# Patient Record
Sex: Female | Born: 1990 | Race: White | Hispanic: No | Marital: Single | Smoking: Never smoker
Health system: Southern US, Community
[De-identification: ages and names within clinical notes are randomized; demographics above are authoritative.]

## PROBLEM LIST (undated history)

## (undated) ENCOUNTER — Inpatient Hospital Stay (HOSPITAL_COMMUNITY): Payer: Self-pay

## (undated) DIAGNOSIS — Z5189 Encounter for other specified aftercare: Secondary | ICD-10-CM

## (undated) DIAGNOSIS — T7840XA Allergy, unspecified, initial encounter: Secondary | ICD-10-CM

## (undated) DIAGNOSIS — J45909 Unspecified asthma, uncomplicated: Secondary | ICD-10-CM

## (undated) DIAGNOSIS — F1910 Other psychoactive substance abuse, uncomplicated: Secondary | ICD-10-CM

## (undated) DIAGNOSIS — F99 Mental disorder, not otherwise specified: Secondary | ICD-10-CM

## (undated) DIAGNOSIS — D649 Anemia, unspecified: Secondary | ICD-10-CM

## (undated) DIAGNOSIS — F419 Anxiety disorder, unspecified: Secondary | ICD-10-CM

## (undated) DIAGNOSIS — B009 Herpesviral infection, unspecified: Secondary | ICD-10-CM

## (undated) DIAGNOSIS — O139 Gestational [pregnancy-induced] hypertension without significant proteinuria, unspecified trimester: Secondary | ICD-10-CM

## (undated) DIAGNOSIS — F32A Depression, unspecified: Secondary | ICD-10-CM

## (undated) HISTORY — DX: Gestational (pregnancy-induced) hypertension without significant proteinuria, unspecified trimester: O13.9

## (undated) HISTORY — PX: CHOLECYSTECTOMY: SHX55

## (undated) HISTORY — PX: WISDOM TOOTH EXTRACTION: SHX21

## (undated) HISTORY — DX: Anxiety disorder, unspecified: F41.9

## (undated) HISTORY — DX: Unspecified asthma, uncomplicated: J45.909

## (undated) HISTORY — DX: Allergy, unspecified, initial encounter: T78.40XA

## (undated) HISTORY — DX: Encounter for other specified aftercare: Z51.89

## (undated) HISTORY — DX: Anemia, unspecified: D64.9

## (undated) HISTORY — DX: Other psychoactive substance abuse, uncomplicated: F19.10

---
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
## 2012-04-19 HISTORY — PX: GALLBLADDER SURGERY: SHX652

## 2013-05-15 DIAGNOSIS — F3289 Other specified depressive episodes: Secondary | ICD-10-CM | POA: Insufficient documentation

## 2013-10-27 LAB — OB RESULTS CONSOLE HGB/HCT, BLOOD
HEMATOCRIT: 35 %
HEMOGLOBIN: 11.9 g/dL

## 2013-10-27 LAB — OB RESULTS CONSOLE RPR: RPR: NONREACTIVE

## 2013-10-27 LAB — OB RESULTS CONSOLE GC/CHLAMYDIA
CHLAMYDIA, DNA PROBE: NEGATIVE
Gonorrhea: NEGATIVE

## 2013-10-27 LAB — OB RESULTS CONSOLE RUBELLA ANTIBODY, IGM: Rubella: IMMUNE

## 2013-10-27 LAB — OB RESULTS CONSOLE ABO/RH: RH Type: POSITIVE

## 2013-10-27 LAB — OB RESULTS CONSOLE HEPATITIS B SURFACE ANTIGEN: Hepatitis B Surface Ag: NEGATIVE

## 2013-10-27 LAB — OB RESULTS CONSOLE VARICELLA ZOSTER ANTIBODY, IGG: Varicella: IMMUNE

## 2013-10-27 LAB — OB RESULTS CONSOLE PLATELET COUNT: PLATELETS: 409 10*3/uL

## 2013-10-27 LAB — OB RESULTS CONSOLE ANTIBODY SCREEN: ANTIBODY SCREEN: NEGATIVE

## 2013-10-27 LAB — OB RESULTS CONSOLE HIV ANTIBODY (ROUTINE TESTING): HIV: NONREACTIVE

## 2014-03-07 LAB — OB RESULTS CONSOLE HGB/HCT, BLOOD
HCT: 11 %
Hemoglobin: 32.5 g/dL

## 2014-03-07 LAB — OB RESULTS CONSOLE PLATELET COUNT: PLATELETS: 340 10*3/uL

## 2014-03-07 LAB — GLUCOSE TOLERANCE, 1 HOUR (50G) W/O FASTING: GLUCOSE 1 HOUR GTT: 123

## 2014-03-07 LAB — OB RESULTS CONSOLE RPR: RPR: NONREACTIVE

## 2014-03-07 LAB — OB RESULTS CONSOLE HIV ANTIBODY (ROUTINE TESTING): HIV: NONREACTIVE

## 2014-04-30 DIAGNOSIS — O34219 Maternal care for unspecified type scar from previous cesarean delivery: Secondary | ICD-10-CM | POA: Insufficient documentation

## 2014-04-30 DIAGNOSIS — O99323 Drug use complicating pregnancy, third trimester: Secondary | ICD-10-CM | POA: Insufficient documentation

## 2014-04-30 DIAGNOSIS — B009 Herpesviral infection, unspecified: Secondary | ICD-10-CM | POA: Insufficient documentation

## 2014-04-30 DIAGNOSIS — Z653 Problems related to other legal circumstances: Secondary | ICD-10-CM | POA: Insufficient documentation

## 2014-04-30 HISTORY — DX: Drug use complicating pregnancy, third trimester: O99.323

## 2014-04-30 HISTORY — DX: Problems related to other legal circumstances: Z65.3

## 2014-05-03 ENCOUNTER — Encounter: Payer: Self-pay | Admitting: *Deleted

## 2014-05-06 ENCOUNTER — Ambulatory Visit (INDEPENDENT_AMBULATORY_CARE_PROVIDER_SITE_OTHER): Payer: Medicaid Other | Admitting: Obstetrics and Gynecology

## 2014-05-06 ENCOUNTER — Encounter: Payer: Self-pay | Admitting: Obstetrics and Gynecology

## 2014-05-06 VITALS — BP 129/93 | HR 107 | Ht 66.0 in | Wt 245.0 lb

## 2014-05-06 DIAGNOSIS — Z3490 Encounter for supervision of normal pregnancy, unspecified, unspecified trimester: Secondary | ICD-10-CM | POA: Insufficient documentation

## 2014-05-06 DIAGNOSIS — O3421 Maternal care for scar from previous cesarean delivery: Secondary | ICD-10-CM

## 2014-05-06 DIAGNOSIS — O34219 Maternal care for unspecified type scar from previous cesarean delivery: Secondary | ICD-10-CM

## 2014-05-06 DIAGNOSIS — O99323 Drug use complicating pregnancy, third trimester: Secondary | ICD-10-CM

## 2014-05-06 DIAGNOSIS — B009 Herpesviral infection, unspecified: Secondary | ICD-10-CM

## 2014-05-06 DIAGNOSIS — Z331 Pregnant state, incidental: Secondary | ICD-10-CM

## 2014-05-06 DIAGNOSIS — Z98891 History of uterine scar from previous surgery: Secondary | ICD-10-CM | POA: Insufficient documentation

## 2014-05-06 DIAGNOSIS — O98513 Other viral diseases complicating pregnancy, third trimester: Secondary | ICD-10-CM

## 2014-05-06 LAB — POCT URINALYSIS DIP (DEVICE)
Bilirubin Urine: NEGATIVE
GLUCOSE, UA: NEGATIVE mg/dL
Hgb urine dipstick: NEGATIVE
KETONES UR: NEGATIVE mg/dL
Leukocytes, UA: NEGATIVE
Nitrite: NEGATIVE
PROTEIN: NEGATIVE mg/dL
Specific Gravity, Urine: 1.02 (ref 1.005–1.030)
Urobilinogen, UA: 0.2 mg/dL (ref 0.0–1.0)
pH: 6 (ref 5.0–8.0)

## 2014-05-06 MED ORDER — PRENATAL PLUS 27-1 MG PO TABS: 1.0000 | ORAL_TABLET | Freq: Every day | ORAL | Status: DC

## 2014-05-06 MED ORDER — VALACYCLOVIR HCL 500 MG PO TABS: 1000.0000 mg | ORAL_TABLET | Freq: Every day | ORAL | Status: DC

## 2014-05-06 NOTE — Progress Notes (Signed)
Transfer from [facility]-- up to date on lab work.  C/o of pelvic pain and lower back pain.  GBS and cultures today.   VBAC consent form signed. To be scanned into chart.

## 2014-05-06 NOTE — Progress Notes (Signed)
+   FM, states she did have some bleeding last week, not sure if it's vaginal, resolved on its own. Just one spot, not "enough to come to hospital." No LOF.  Occasional Braxton hicks contractions Occasional white discharge, no itch, no smell, no burn.   A/P: #) HSV - rx'd valtrex. No current lesions #) H/O TOLAC - discussed VBAC. Desires. VBAC calculator ~ 46%. Desires TOLAC, consented today. If no spontaneous labor by ~ 41 weeks, prefers repeat LTCS rather than induction. #) H/O drug use - denies for last 2 mos, UDS today #) PNL - wet prep. Gc/ct, GBS today. All other labs UTD #) FWB - s= d, + FHTs, reassuring.   RTC 1 w

## 2014-05-07 LAB — DRUG SCREEN, URINE
Amphetamine Screen, Ur: NEGATIVE
BARBITURATE QUANT UR: NEGATIVE
BENZODIAZEPINES.: NEGATIVE
Cocaine Metabolites: NEGATIVE
Creatinine,U: 65.73 mg/dL
Marijuana Metabolite: NEGATIVE
Methadone: NEGATIVE
OPIATES: NEGATIVE
PROPOXYPHENE: NEGATIVE
Phencyclidine (PCP): NEGATIVE

## 2014-05-07 LAB — GC/CHLAMYDIA PROBE AMP
CT Probe RNA: NEGATIVE
GC Probe RNA: NEGATIVE

## 2014-05-08 LAB — CULTURE, BETA STREP (GROUP B ONLY)

## 2014-05-13 ENCOUNTER — Encounter: Payer: Self-pay | Admitting: *Deleted

## 2014-05-16 ENCOUNTER — Ambulatory Visit (HOSPITAL_COMMUNITY)
Admission: RE | Admit: 2014-05-16 | Discharge: 2014-05-16 | Disposition: A | Discharge status: Home / Self Care | Payer: Medicaid Other | Source: Ambulatory Visit | Attending: Obstetrics & Gynecology | Admitting: Obstetrics & Gynecology

## 2014-05-16 ENCOUNTER — Ambulatory Visit (INDEPENDENT_AMBULATORY_CARE_PROVIDER_SITE_OTHER): Payer: Medicaid Other | Admitting: Family

## 2014-05-16 VITALS — BP 129/90 | HR 111 | Temp 97.7°F | Wt 250.1 lb

## 2014-05-16 DIAGNOSIS — O133 Gestational [pregnancy-induced] hypertension without significant proteinuria, third trimester: Secondary | ICD-10-CM | POA: Diagnosis present

## 2014-05-16 DIAGNOSIS — Z3493 Encounter for supervision of normal pregnancy, unspecified, third trimester: Secondary | ICD-10-CM

## 2014-05-16 DIAGNOSIS — O3421 Maternal care for scar from previous cesarean delivery: Secondary | ICD-10-CM

## 2014-05-16 DIAGNOSIS — Z3A37 37 weeks gestation of pregnancy: Secondary | ICD-10-CM | POA: Insufficient documentation

## 2014-05-16 DIAGNOSIS — Z3A38 38 weeks gestation of pregnancy: Secondary | ICD-10-CM | POA: Insufficient documentation

## 2014-05-16 DIAGNOSIS — O139 Gestational [pregnancy-induced] hypertension without significant proteinuria, unspecified trimester: Secondary | ICD-10-CM | POA: Insufficient documentation

## 2014-05-16 DIAGNOSIS — O98513 Other viral diseases complicating pregnancy, third trimester: Secondary | ICD-10-CM

## 2014-05-16 DIAGNOSIS — B009 Herpesviral infection, unspecified: Secondary | ICD-10-CM

## 2014-05-16 DIAGNOSIS — Z3490 Encounter for supervision of normal pregnancy, unspecified, unspecified trimester: Secondary | ICD-10-CM

## 2014-05-16 LAB — COMPREHENSIVE METABOLIC PANEL
ALK PHOS: 112 U/L (ref 39–117)
ALT: 10 U/L (ref 0–35)
AST: 16 U/L (ref 0–37)
Albumin: 3.3 g/dL — ABNORMAL LOW (ref 3.5–5.2)
BUN: 7 mg/dL (ref 6–23)
CO2: 23 mEq/L (ref 19–32)
Calcium: 9.3 mg/dL (ref 8.4–10.5)
Chloride: 104 mEq/L (ref 96–112)
Creat: 0.52 mg/dL (ref 0.50–1.10)
GLUCOSE: 84 mg/dL (ref 70–99)
POTASSIUM: 4.6 meq/L (ref 3.5–5.3)
Sodium: 134 mEq/L — ABNORMAL LOW (ref 135–145)
Total Bilirubin: 0.3 mg/dL (ref 0.2–1.2)
Total Protein: 6.2 g/dL (ref 6.0–8.3)

## 2014-05-16 LAB — CBC
HEMATOCRIT: 36.5 % (ref 36.0–46.0)
Hemoglobin: 12.2 g/dL (ref 12.0–15.0)
MCH: 26.8 pg (ref 26.0–34.0)
MCHC: 33.4 g/dL (ref 30.0–36.0)
MCV: 80 fL (ref 78.0–100.0)
MPV: 9.4 fL (ref 8.6–12.4)
PLATELETS: 301 10*3/uL (ref 150–400)
RBC: 4.56 MIL/uL (ref 3.87–5.11)
RDW: 15.6 % — AB (ref 11.5–15.5)
WBC: 12.7 10*3/uL — ABNORMAL HIGH (ref 4.0–10.5)

## 2014-05-16 LAB — POCT URINALYSIS DIP (DEVICE)
Bilirubin Urine: NEGATIVE
Glucose, UA: NEGATIVE mg/dL
Ketones, ur: NEGATIVE mg/dL
Nitrite: NEGATIVE
Protein, ur: NEGATIVE mg/dL
SPECIFIC GRAVITY, URINE: 1.025 (ref 1.005–1.030)
Urobilinogen, UA: 0.2 mg/dL (ref 0.0–1.0)
pH: 5.5 (ref 5.0–8.0)

## 2014-05-16 NOTE — Progress Notes (Signed)
Denies headache, vision changes, or epigastric pain.  Consulted with Dr. Erin FullingHarraway-Smith > obtain labs, NST today - reactive in MFM.  Begin 2x/week testing.

## 2014-05-16 NOTE — Progress Notes (Signed)
Pt complains of pain in RLQ and LLQ "sharp pains"  Pt states that Fetal Movement has decreased

## 2014-05-17 LAB — PROTEIN / CREATININE RATIO, URINE
CREATININE, URINE: 151.5 mg/dL
PROTEIN CREATININE RATIO: 0.1 (ref <0.15)
TOTAL PROTEIN, URINE: 15 mg/dL (ref 5–24)

## 2014-05-20 ENCOUNTER — Encounter (HOSPITAL_COMMUNITY): Payer: Self-pay | Admitting: *Deleted

## 2014-05-20 ENCOUNTER — Encounter: Payer: Self-pay | Admitting: Obstetrics & Gynecology

## 2014-05-20 ENCOUNTER — Ambulatory Visit (INDEPENDENT_AMBULATORY_CARE_PROVIDER_SITE_OTHER): Payer: Medicaid Other | Admitting: Obstetrics & Gynecology

## 2014-05-20 VITALS — BP 115/69 | HR 102 | Wt 253.3 lb

## 2014-05-20 DIAGNOSIS — O133 Gestational [pregnancy-induced] hypertension without significant proteinuria, third trimester: Secondary | ICD-10-CM

## 2014-05-20 DIAGNOSIS — O3421 Maternal care for scar from previous cesarean delivery: Secondary | ICD-10-CM

## 2014-05-20 DIAGNOSIS — O98513 Other viral diseases complicating pregnancy, third trimester: Secondary | ICD-10-CM

## 2014-05-20 DIAGNOSIS — B009 Herpesviral infection, unspecified: Secondary | ICD-10-CM

## 2014-05-20 LAB — POCT URINALYSIS DIP (DEVICE)
Bilirubin Urine: NEGATIVE
Glucose, UA: NEGATIVE mg/dL
Hgb urine dipstick: NEGATIVE
KETONES UR: NEGATIVE mg/dL
Nitrite: NEGATIVE
Protein, ur: NEGATIVE mg/dL
SPECIFIC GRAVITY, URINE: 1.025 (ref 1.005–1.030)
UROBILINOGEN UA: 0.2 mg/dL (ref 0.0–1.0)
pH: 5.5 (ref 5.0–8.0)

## 2014-05-20 NOTE — Progress Notes (Signed)
Pt worried she will not be successful for VBAC.  Pt's cervix is closed and long with tone.  Pt would like rpt c/s at 39 weeks.  Scheduled out of clinic today by [place] and pt knows time to arrive and NPO status.  BP stable today.  Testing reassruing.

## 2014-05-21 ENCOUNTER — Inpatient Hospital Stay (HOSPITAL_COMMUNITY): Payer: Medicaid Other | Admitting: Anesthesiology

## 2014-05-21 ENCOUNTER — Encounter (HOSPITAL_COMMUNITY): Payer: Self-pay | Admitting: Anesthesiology

## 2014-05-21 ENCOUNTER — Encounter (HOSPITAL_COMMUNITY)
Admission: RE | Disposition: A | Discharge status: Home / Self Care | Payer: Self-pay | Source: Ambulatory Visit | Attending: Obstetrics & Gynecology

## 2014-05-21 ENCOUNTER — Inpatient Hospital Stay (HOSPITAL_COMMUNITY)
Admission: RE | Admit: 2014-05-21 | Discharge: 2014-05-24 | DRG: 765 | Disposition: A | Discharge status: Home / Self Care | Payer: Medicaid Other | Source: Ambulatory Visit | Attending: Obstetrics & Gynecology | Admitting: Obstetrics & Gynecology

## 2014-05-21 DIAGNOSIS — O3421 Maternal care for scar from previous cesarean delivery: Secondary | ICD-10-CM | POA: Diagnosis present

## 2014-05-21 DIAGNOSIS — Z3A39 39 weeks gestation of pregnancy: Secondary | ICD-10-CM | POA: Diagnosis present

## 2014-05-21 DIAGNOSIS — O98513 Other viral diseases complicating pregnancy, third trimester: Secondary | ICD-10-CM | POA: Diagnosis not present

## 2014-05-21 DIAGNOSIS — Z98891 History of uterine scar from previous surgery: Secondary | ICD-10-CM

## 2014-05-21 DIAGNOSIS — B009 Herpesviral infection, unspecified: Secondary | ICD-10-CM | POA: Diagnosis not present

## 2014-05-21 DIAGNOSIS — O133 Gestational [pregnancy-induced] hypertension without significant proteinuria, third trimester: Secondary | ICD-10-CM

## 2014-05-21 DIAGNOSIS — O1493 Unspecified pre-eclampsia, third trimester: Secondary | ICD-10-CM | POA: Diagnosis present

## 2014-05-21 DIAGNOSIS — O99323 Drug use complicating pregnancy, third trimester: Secondary | ICD-10-CM

## 2014-05-21 HISTORY — DX: Herpesviral infection, unspecified: B00.9

## 2014-05-21 HISTORY — DX: Mental disorder, not otherwise specified: F99

## 2014-05-21 LAB — ABO/RH: ABO/RH(D): O POS

## 2014-05-21 LAB — CBC
HCT: 35.9 % — ABNORMAL LOW (ref 36.0–46.0)
Hemoglobin: 12 g/dL (ref 12.0–15.0)
MCH: 27.2 pg (ref 26.0–34.0)
MCHC: 33.4 g/dL (ref 30.0–36.0)
MCV: 81.4 fL (ref 78.0–100.0)
Platelets: 271 10*3/uL (ref 150–400)
RBC: 4.41 MIL/uL (ref 3.87–5.11)
RDW: 15.4 % (ref 11.5–15.5)
WBC: 13.5 10*3/uL — ABNORMAL HIGH (ref 4.0–10.5)

## 2014-05-21 LAB — RAPID URINE DRUG SCREEN, HOSP PERFORMED
AMPHETAMINES: NOT DETECTED
BARBITURATES: NOT DETECTED
Benzodiazepines: NOT DETECTED
Cocaine: NOT DETECTED
Opiates: NOT DETECTED
TETRAHYDROCANNABINOL: NOT DETECTED

## 2014-05-21 LAB — TYPE AND SCREEN
ABO/RH(D): O POS
Antibody Screen: NEGATIVE

## 2014-05-21 SURGERY — Surgical Case
Anesthesia: Spinal | Site: Abdomen

## 2014-05-21 MED ORDER — NALBUPHINE HCL 10 MG/ML IJ SOLN
5.0000 mg | INTRAMUSCULAR | Status: DC | PRN
  Administered 2014-05-21: 5 mg via SUBCUTANEOUS
  Filled 2014-05-21: qty 1

## 2014-05-21 MED ORDER — BUPIVACAINE HCL (PF) 0.25 % IJ SOLN
INTRAMUSCULAR | Status: DC | PRN
  Administered 2014-05-21: 30 mL

## 2014-05-21 MED ORDER — MEPERIDINE HCL 25 MG/ML IJ SOLN: 6.2500 mg | INTRAMUSCULAR | Status: DC | PRN

## 2014-05-21 MED ORDER — FENTANYL CITRATE 0.05 MG/ML IJ SOLN
INTRAMUSCULAR | Status: AC
  Filled 2014-05-21: qty 2

## 2014-05-21 MED ORDER — SENNOSIDES-DOCUSATE SODIUM 8.6-50 MG PO TABS
2.0000 | ORAL_TABLET | ORAL | Status: DC
  Administered 2014-05-22 – 2014-05-23 (×3): 2 via ORAL
  Filled 2014-05-21 (×3): qty 2

## 2014-05-21 MED ORDER — NALOXONE HCL 1 MG/ML IJ SOLN
1.0000 ug/kg/h | INTRAVENOUS | Status: DC | PRN
  Filled 2014-05-21: qty 2

## 2014-05-21 MED ORDER — ZOLPIDEM TARTRATE 5 MG PO TABS: 5.0000 mg | ORAL_TABLET | Freq: Every evening | ORAL | Status: DC | PRN

## 2014-05-21 MED ORDER — SCOPOLAMINE 1 MG/3DAYS TD PT72
1.0000 | MEDICATED_PATCH | Freq: Once | TRANSDERMAL | Status: DC
  Filled 2014-05-21: qty 1

## 2014-05-21 MED ORDER — ONDANSETRON HCL 4 MG/2ML IJ SOLN
INTRAMUSCULAR | Status: AC
  Filled 2014-05-21: qty 2

## 2014-05-21 MED ORDER — HYDROMORPHONE HCL 1 MG/ML IJ SOLN
INTRAMUSCULAR | Status: AC
  Administered 2014-05-21: 0.5 mg via INTRAVENOUS
  Filled 2014-05-21: qty 1

## 2014-05-21 MED ORDER — KETOROLAC TROMETHAMINE 30 MG/ML IJ SOLN: 30.0000 mg | Freq: Once | INTRAMUSCULAR | Status: DC

## 2014-05-21 MED ORDER — SIMETHICONE 80 MG PO CHEW
80.0000 mg | CHEWABLE_TABLET | Freq: Three times a day (TID) | ORAL | Status: DC
  Administered 2014-05-21 – 2014-05-24 (×7): 80 mg via ORAL
  Filled 2014-05-21 (×8): qty 1

## 2014-05-21 MED ORDER — SIMETHICONE 80 MG PO CHEW
80.0000 mg | CHEWABLE_TABLET | ORAL | Status: DC
  Administered 2014-05-22 – 2014-05-23 (×3): 80 mg via ORAL
  Filled 2014-05-21 (×3): qty 1

## 2014-05-21 MED ORDER — OXYTOCIN 40 UNITS IN LACTATED RINGERS INFUSION - SIMPLE MED: 62.5000 mL/h | INTRAVENOUS | Status: AC

## 2014-05-21 MED ORDER — KETOROLAC TROMETHAMINE 30 MG/ML IJ SOLN: 30.0000 mg | Freq: Four times a day (QID) | INTRAMUSCULAR | Status: DC | PRN

## 2014-05-21 MED ORDER — ONDANSETRON HCL 4 MG/2ML IJ SOLN
INTRAMUSCULAR | Status: DC | PRN
  Administered 2014-05-21: 4 mg via INTRAVENOUS

## 2014-05-21 MED ORDER — LACTATED RINGERS IV SOLN
Freq: Once | INTRAVENOUS | Status: AC
  Administered 2014-05-21: 10:00:00 via INTRAVENOUS

## 2014-05-21 MED ORDER — PHENYLEPHRINE HCL 10 MG/ML IJ SOLN
INTRAMUSCULAR | Status: DC | PRN
  Administered 2014-05-21: 40 ug via INTRAVENOUS
  Administered 2014-05-21 (×2): 80 ug via INTRAVENOUS

## 2014-05-21 MED ORDER — NALBUPHINE HCL 10 MG/ML IJ SOLN: 5.0000 mg | Freq: Once | INTRAMUSCULAR | Status: AC | PRN

## 2014-05-21 MED ORDER — SCOPOLAMINE 1 MG/3DAYS TD PT72
MEDICATED_PATCH | TRANSDERMAL | Status: AC
  Administered 2014-05-21: 1.5 mg via TRANSDERMAL
  Filled 2014-05-21: qty 1

## 2014-05-21 MED ORDER — MENTHOL 3 MG MT LOZG: 1.0000 | LOZENGE | OROMUCOSAL | Status: DC | PRN

## 2014-05-21 MED ORDER — LACTATED RINGERS IV SOLN: INTRAVENOUS | Status: DC

## 2014-05-21 MED ORDER — SODIUM CHLORIDE 0.9 % IJ SOLN: 3.0000 mL | INTRAMUSCULAR | Status: DC | PRN

## 2014-05-21 MED ORDER — KETOROLAC TROMETHAMINE 30 MG/ML IJ SOLN
30.0000 mg | Freq: Four times a day (QID) | INTRAMUSCULAR | Status: DC | PRN
  Administered 2014-05-21: 30 mg via INTRAMUSCULAR

## 2014-05-21 MED ORDER — PHENYLEPHRINE 8 MG IN D5W 100 ML (0.08MG/ML) PREMIX OPTIME
INJECTION | INTRAVENOUS | Status: AC
  Filled 2014-05-21: qty 100

## 2014-05-21 MED ORDER — KETOROLAC TROMETHAMINE 30 MG/ML IJ SOLN
INTRAMUSCULAR | Status: AC
  Administered 2014-05-21: 30 mg via INTRAMUSCULAR
  Filled 2014-05-21: qty 1

## 2014-05-21 MED ORDER — NALOXONE HCL 0.4 MG/ML IJ SOLN: 0.4000 mg | INTRAMUSCULAR | Status: DC | PRN

## 2014-05-21 MED ORDER — DIBUCAINE 1 % RE OINT: 1.0000 "application " | TOPICAL_OINTMENT | RECTAL | Status: DC | PRN

## 2014-05-21 MED ORDER — IBUPROFEN 600 MG PO TABS: 600.0000 mg | ORAL_TABLET | Freq: Four times a day (QID) | ORAL | Status: DC | PRN

## 2014-05-21 MED ORDER — BUPIVACAINE HCL (PF) 0.25 % IJ SOLN
INTRAMUSCULAR | Status: AC
  Filled 2014-05-21: qty 30

## 2014-05-21 MED ORDER — LACTATED RINGERS IV SOLN
INTRAVENOUS | Status: DC | PRN
  Administered 2014-05-21 (×3): via INTRAVENOUS

## 2014-05-21 MED ORDER — ONDANSETRON HCL 4 MG PO TABS: 4.0000 mg | ORAL_TABLET | ORAL | Status: DC | PRN

## 2014-05-21 MED ORDER — PROMETHAZINE HCL 25 MG/ML IJ SOLN: 6.2500 mg | INTRAMUSCULAR | Status: DC | PRN

## 2014-05-21 MED ORDER — PHENYLEPHRINE 40 MCG/ML (10ML) SYRINGE FOR IV PUSH (FOR BLOOD PRESSURE SUPPORT)
PREFILLED_SYRINGE | INTRAVENOUS | Status: AC
  Filled 2014-05-21: qty 40

## 2014-05-21 MED ORDER — TETANUS-DIPHTH-ACELL PERTUSSIS 5-2.5-18.5 LF-MCG/0.5 IM SUSP: 0.5000 mL | Freq: Once | INTRAMUSCULAR | Status: DC

## 2014-05-21 MED ORDER — DIPHENHYDRAMINE HCL 50 MG/ML IJ SOLN: 12.5000 mg | INTRAMUSCULAR | Status: DC | PRN

## 2014-05-21 MED ORDER — SCOPOLAMINE 1 MG/3DAYS TD PT72
1.0000 | MEDICATED_PATCH | Freq: Once | TRANSDERMAL | Status: DC
  Administered 2014-05-21: 1.5 mg via TRANSDERMAL

## 2014-05-21 MED ORDER — SIMETHICONE 80 MG PO CHEW: 80.0000 mg | CHEWABLE_TABLET | ORAL | Status: DC | PRN

## 2014-05-21 MED ORDER — MORPHINE SULFATE (PF) 0.5 MG/ML IJ SOLN
INTRAMUSCULAR | Status: DC | PRN
  Administered 2014-05-21: .2 mg via INTRATHECAL

## 2014-05-21 MED ORDER — LACTATED RINGERS IV SOLN
INTRAVENOUS | Status: DC
  Administered 2014-05-21: 11:00:00 via INTRAVENOUS

## 2014-05-21 MED ORDER — OXYCODONE-ACETAMINOPHEN 5-325 MG PO TABS
1.0000 | ORAL_TABLET | ORAL | Status: DC | PRN
  Administered 2014-05-22 – 2014-05-24 (×10): 1 via ORAL
  Filled 2014-05-21 (×12): qty 1

## 2014-05-21 MED ORDER — IBUPROFEN 600 MG PO TABS
600.0000 mg | ORAL_TABLET | Freq: Four times a day (QID) | ORAL | Status: DC
  Administered 2014-05-21 – 2014-05-24 (×12): 600 mg via ORAL
  Filled 2014-05-21 (×12): qty 1

## 2014-05-21 MED ORDER — NALBUPHINE HCL 10 MG/ML IJ SOLN
5.0000 mg | INTRAMUSCULAR | Status: DC | PRN
  Administered 2014-05-21: 5 mg via INTRAVENOUS
  Filled 2014-05-21: qty 1

## 2014-05-21 MED ORDER — BUPIVACAINE IN DEXTROSE 0.75-8.25 % IT SOLN
INTRATHECAL | Status: DC | PRN
  Administered 2014-05-21: 1.6 mL via INTRATHECAL

## 2014-05-21 MED ORDER — DIPHENHYDRAMINE HCL 25 MG PO CAPS: 25.0000 mg | ORAL_CAPSULE | Freq: Four times a day (QID) | ORAL | Status: DC | PRN

## 2014-05-21 MED ORDER — FENTANYL CITRATE 0.05 MG/ML IJ SOLN
INTRAMUSCULAR | Status: DC | PRN
  Administered 2014-05-21: 12.5 ug via INTRATHECAL

## 2014-05-21 MED ORDER — PHENYLEPHRINE 8 MG IN D5W 100 ML (0.08MG/ML) PREMIX OPTIME
INJECTION | INTRAVENOUS | Status: DC | PRN
  Administered 2014-05-21: 80 ug/min via INTRAVENOUS

## 2014-05-21 MED ORDER — PRENATAL MULTIVITAMIN CH
1.0000 | ORAL_TABLET | Freq: Every day | ORAL | Status: DC
  Administered 2014-05-22 – 2014-05-24 (×3): 1 via ORAL
  Filled 2014-05-21 (×3): qty 1

## 2014-05-21 MED ORDER — HYDROMORPHONE HCL 1 MG/ML IJ SOLN
0.2500 mg | INTRAMUSCULAR | Status: DC | PRN
  Administered 2014-05-21 (×2): 0.5 mg via INTRAVENOUS

## 2014-05-21 MED ORDER — MORPHINE SULFATE 0.5 MG/ML IJ SOLN
INTRAMUSCULAR | Status: AC
  Filled 2014-05-21: qty 10

## 2014-05-21 MED ORDER — DIPHENHYDRAMINE HCL 25 MG PO CAPS: 25.0000 mg | ORAL_CAPSULE | ORAL | Status: DC | PRN

## 2014-05-21 MED ORDER — WITCH HAZEL-GLYCERIN EX PADS: 1.0000 "application " | MEDICATED_PAD | CUTANEOUS | Status: DC | PRN

## 2014-05-21 MED ORDER — OXYTOCIN 10 UNIT/ML IJ SOLN
40.0000 [IU] | INTRAVENOUS | Status: DC | PRN
  Administered 2014-05-21: 40 [IU] via INTRAVENOUS

## 2014-05-21 MED ORDER — ONDANSETRON HCL 4 MG/2ML IJ SOLN: 4.0000 mg | INTRAMUSCULAR | Status: DC | PRN

## 2014-05-21 MED ORDER — DEXTROSE 5 % IV SOLN
2.0000 g | INTRAVENOUS | Status: AC
  Administered 2014-05-21: 2 g via INTRAVENOUS
  Filled 2014-05-21: qty 2

## 2014-05-21 MED ORDER — OXYCODONE-ACETAMINOPHEN 5-325 MG PO TABS
2.0000 | ORAL_TABLET | ORAL | Status: DC | PRN
  Administered 2014-05-22: 2 via ORAL

## 2014-05-21 MED ORDER — LANOLIN HYDROUS EX OINT: 1.0000 "application " | TOPICAL_OINTMENT | CUTANEOUS | Status: DC | PRN

## 2014-05-21 MED ORDER — ONDANSETRON HCL 4 MG/2ML IJ SOLN
4.0000 mg | Freq: Three times a day (TID) | INTRAMUSCULAR | Status: DC | PRN
Start: 2014-05-21 — End: 2014-05-24

## 2014-05-21 MED ORDER — OXYTOCIN 10 UNIT/ML IJ SOLN
INTRAMUSCULAR | Status: AC
  Filled 2014-05-21: qty 4

## 2014-05-21 SURGICAL SUPPLY — 29 items
CLAMP CORD UMBIL (MISCELLANEOUS) ×3 IMPLANT
CLOTH BEACON ORANGE TIMEOUT ST (SAFETY) ×3 IMPLANT
DRAPE SHEET LG 3/4 BI-LAMINATE (DRAPES) ×3 IMPLANT
DRSG OPSITE POSTOP 4X10 (GAUZE/BANDAGES/DRESSINGS) ×3 IMPLANT
DURAPREP 26ML APPLICATOR (WOUND CARE) ×3 IMPLANT
ELECT REM PT RETURN 9FT ADLT (ELECTROSURGICAL) ×3
ELECTRODE REM PT RTRN 9FT ADLT (ELECTROSURGICAL) ×1 IMPLANT
EXTRACTOR VACUUM KIWI (MISCELLANEOUS) ×3 IMPLANT
EXTRACTOR VACUUM M CUP 4 TUBE (SUCTIONS) IMPLANT
EXTRACTOR VACUUM M CUP 4' TUBE (SUCTIONS)
GLOVE BIOGEL PI IND STRL 7.0 (GLOVE) ×1 IMPLANT
GLOVE BIOGEL PI INDICATOR 7.0 (GLOVE) ×2
GLOVE ECLIPSE 7.0 STRL STRAW (GLOVE) ×6 IMPLANT
GOWN STRL REUS W/TWL LRG LVL3 (GOWN DISPOSABLE) ×6 IMPLANT
KIT ABG SYR 3ML LUER SLIP (SYRINGE) IMPLANT
NEEDLE HYPO 22GX1.5 SAFETY (NEEDLE) ×3 IMPLANT
NEEDLE HYPO 25X5/8 SAFETYGLIDE (NEEDLE) IMPLANT
NS IRRIG 1000ML POUR BTL (IV SOLUTION) ×3 IMPLANT
PACK C SECTION WH (CUSTOM PROCEDURE TRAY) ×3 IMPLANT
PAD OB MATERNITY 4.3X12.25 (PERSONAL CARE ITEMS) ×3 IMPLANT
RTRCTR C-SECT PINK 25CM LRG (MISCELLANEOUS) ×3 IMPLANT
STAPLER VISISTAT 35W (STAPLE) IMPLANT
SUT PLAIN 2 0 XLH (SUTURE) ×3 IMPLANT
SUT VIC AB 0 CTX 36 (SUTURE) ×8
SUT VIC AB 0 CTX36XBRD ANBCTRL (SUTURE) ×4 IMPLANT
SUT VIC AB 4-0 KS 27 (SUTURE) ×3 IMPLANT
SYR 30ML LL (SYRINGE) ×3 IMPLANT
TOWEL OR 17X24 6PK STRL BLUE (TOWEL DISPOSABLE) ×3 IMPLANT
TRAY FOLEY CATH 14FR (SET/KITS/TRAYS/PACK) ×3 IMPLANT

## 2014-05-21 NOTE — Anesthesia Postprocedure Evaluation (Signed)
  Anesthesia Post-op Note  Patient: Miranda RancherRebecca Robertson  Procedure(s) Performed: Procedure(s): CESAREAN SECTION (N/A)  Patient Location: PACU and Mother/Baby  Anesthesia Type:Spinal  Level of Consciousness: awake, alert  and oriented  Airway and Oxygen Therapy: Patient Spontanous Breathing  Post-op Pain: mild  Post-op Assessment: Post-op Vital signs reviewed, Patient's Cardiovascular Status Stable, Respiratory Function Stable, No signs of Nausea or vomiting, Adequate PO intake, Pain level controlled, No headache, No backache, No residual numbness and No residual motor weakness  Post-op Vital Signs: Reviewed and stable  Last Vitals:  Filed Vitals:   05/21/14 1630  BP: 111/66  Pulse: 70  Temp: 36.7 C  Resp: 20    Complications: No apparent anesthesia complications

## 2014-05-21 NOTE — Anesthesia Procedure Notes (Signed)
Spinal Patient location during procedure: OR Start time: 05/21/2014 10:43 AM End time: 05/21/2014 10:46 AM Staffing Anesthesiologist: Leilani AbleHATCHETT, Trystan Eads Performed by: anesthesiologist  Preanesthetic Checklist Completed: patient identified, surgical consent, pre-op evaluation, timeout performed, IV checked, risks and benefits discussed and monitors and equipment checked Spinal Block Patient position: sitting Prep: site prepped and draped and DuraPrep Patient monitoring: heart rate, cardiac monitor, continuous pulse ox and blood pressure Approach: midline Location: L3-4 Injection technique: single-shot Needle Needle type: Pencan  Needle gauge: 24 G Needle length: 9 cm Needle insertion depth: 8 cm Assessment Sensory level: T4

## 2014-05-21 NOTE — H&P (Signed)
LABOR ADMISSION HISTORY AND PHYSICAL  Miranda Robertson is a 24 y.o. female G2P1001 with IUP at 589w0d by LMP c/w 21w sono presenting for repeat cesarean section, declines TOL. She reports +FMs, No LOF, no VB, no blurry vision, headaches or peripheral edema, and RUQ pain.  She plans on bottle feeding. She request OCPs for birth control.  Dating: By 7647w0d sono --->  Estimated Date of Delivery: 05/28/14    Prenatal History/Complications:  Past Medical History: Past Medical History  Diagnosis Date  . HSV (herpes simplex virus) infection   . Asthma     Childhood  . Mental disorder     bipolar, depression, mild menatl retardation, suicidal thoughts in 2010    Past Surgical History: Past Surgical History  Procedure Laterality Date  . Cholecystectomy    . Wisdom tooth extraction    . Cesarean section  06/09/12    FTP    Obstetrical History: OB History    Gravida Para Term Preterm AB TAB SAB Ectopic Multiple Living   2 1 1       1       Social History: History   Social History  . Marital Status: Single    Spouse Name: N/A    Number of Children: N/A  . Years of Education: N/A   Social History Main Topics  . Smoking status: Former Games developermoker  . Smokeless tobacco: Never Used  . Alcohol Use: No  . Drug Use: No  . Sexual Activity: Yes   Other Topics Concern  . None   Social History Narrative    Family History: No family history on file.  Allergies: No Known Allergies  Prescriptions prior to admission  Medication Sig Dispense Refill Last Dose  . valACYclovir (VALTREX) 500 MG tablet Take 2 tablets (1,000 mg total) by mouth daily. 60 tablet 3 05/21/2014 at 0715  . prenatal vitamin w/FE, FA (PRENATAL 1 + 1) 27-1 MG TABS tablet Take 1 tablet by mouth daily at 12 noon. 30 each 0 Taking     Review of Systems   All systems reviewed and negative except as stated in HPI  Blood pressure 136/88, pulse 96, temperature 98.2 F (36.8 C), temperature source Oral, last menstrual period  08/21/2013, SpO2 99 %. General appearance: alert and cooperative Lungs: clear to auscultation bilaterally Heart: regular rate and rhythm Abdomen: soft, non-tender; bowel sounds normal Extremities: Homans sign is negative, no sign of DVT      Prenatal labs: ABO, Rh: O/Positive/-- (07/11 0000) Antibody: Negative (07/11 0000) Rubella:   RPR: Nonreactive (11/19 0000)  HBsAg: Negative (07/11 0000)  HIV: Non-reactive (11/19 0000)  GBS:    1 hr Glucola 123 Genetic screening  Normal quad Anatomy US normal   Clinic LROB Prenatal Labs  Dating L/9 wk us Blood type: O/Positive/-- (07/11 0000)  Genetic Screen  Quad: normal     Antibody:Negative (07/11 0000)  Anatomic US Boy! Miranda Robertson Rubella: Immune (07/11 0000)  GTT Early:               Third trimester: 123 RPR: Nonreactive (11/19 0000)   TDaP vaccine 03-07-14 HBsAg: Negative (07/11 0000)   Flu vaccine 01-23-14 HIV: Non-reactive (11/19 0000)   GBS  GBS:   Contraception OCPs Pap:  Baby Food Bottle   Circumcision Yes - OSH   Pediatrician    Support Person Miranda [place]       Results for orders placed or performed during the hospital encounter of 05/21/14 (from the past 24 hour(s))  CBC   Collection Time: 05/21/14  8:10 AM  Result Value Ref Range   WBC 13.5 (H) 4.0 - 10.5 K/uL   RBC 4.41 3.87 - 5.11 MIL/uL   Hemoglobin 12.0 12.0 - 15.0 g/dL   HCT 16.1 (L) 09.6 - 04.5 %   MCV 81.4 78.0 - 100.0 fL   MCH 27.2 26.0 - 34.0 pg   MCHC 33.4 30.0 - 36.0 g/dL   RDW 40.9 81.1 - 91.4 %   Platelets 271 150 - 400 K/uL  Results for orders placed or performed in visit on 05/20/14 (from the past 24 hour(s))  POCT urinalysis dip (device)   Collection Time: 05/20/14  9:15 AM  Result Value Ref Range   Glucose, UA NEGATIVE NEGATIVE mg/dL   Bilirubin Urine NEGATIVE NEGATIVE   Ketones, ur NEGATIVE NEGATIVE mg/dL   Specific Gravity, Urine 1.025 1.005 - 1.030   Hgb urine dipstick NEGATIVE NEGATIVE   pH 5.5 5.0 - 8.0   Protein, ur  NEGATIVE NEGATIVE mg/dL   Urobilinogen, UA 0.2 0.0 - 1.0 mg/dL   Nitrite NEGATIVE NEGATIVE   Leukocytes, UA MODERATE (A) NEGATIVE    Patient Active Problem List   Diagnosis Date Noted  . Gestational hypertension without significant proteinuria, antepartum 05/16/2014  . Gestational hypertension   . H/O cesarean section complicating pregnancy 05/06/2014  . Pregnant 05/06/2014  . 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 Lost custody of children 04/30/2014  . Previous cesarean delivery, antepartum 04/30/2014    Assessment: Miranda Robertson is a 24 y.o. G2P1001 at [redacted]w[redacted]d here for repeat cesarean section  #MOF: bottle #MOC:OCPs #Circ:  OSH  Hx of HSV, no current outbreaks GHTN: asymptomatic currently Hx of THC: last used several months ago, SW consult  The risks of cesarean section discussed with the patient included but were not limited to: bleeding which may require transfusion or reoperation; infection which may require antibiotics; injury to bowel, bladder, ureters or other surrounding organs; injury to the fetus; need for additional procedures including hysterectomy in the event of a life-threatening hemorrhage; placental abnormalities wth subsequent pregnancies, incisional problems, thromboembolic phenomenon and other postoperative/anesthesia complications. The patient concurred with the proposed plan, giving informed written consent for the procedure.   Patient will remain NPO for procedure. Anesthesia and OR aware. Preoperative prophylactic antibiotics and SCDs ordered on call to the OR.  To OR when ready.   Miranda Robertson Miranda Robertson 05/21/2014, 9:09 AM

## 2014-05-21 NOTE — Anesthesia Preprocedure Evaluation (Signed)
Anesthesia Evaluation  Patient identified by MRN, date of birth, ID band Patient awake    Reviewed: Allergy & Precautions, H&P , Patient's Chart, lab work & pertinent test results  Airway Mallampati: II  TM Distance: >3 FB Neck ROM: full    Dental no notable dental hx.    Pulmonary former smoker,    Pulmonary exam normal       Cardiovascular negative cardio ROS  Rhythm:regular Rate:Normal     Neuro/Psych negative neurological ROS  negative psych ROS   GI/Hepatic negative GI ROS, Neg liver ROS,   Endo/Other  Morbid obesity  Renal/GU negative Renal ROS     Musculoskeletal   Abdominal (+) + obese,   Peds  Hematology negative hematology ROS (+)   Anesthesia Other Findings   Reproductive/Obstetrics (+) Pregnancy                             Anesthesia Physical Anesthesia Plan  ASA: III  Anesthesia Plan: Spinal   Post-op Pain Management:    Induction:   Airway Management Planned:   Additional Equipment:   Intra-op Plan:   Post-operative Plan:   Informed Consent: I have reviewed the patients History and Physical, chart, labs and discussed the procedure including the risks, benefits and alternatives for the proposed anesthesia with the patient or authorized representative who has indicated his/her understanding and acceptance.     Plan Discussed with: CRNA and Surgeon  Anesthesia Plan Comments:         Anesthesia Quick Evaluation

## 2014-05-21 NOTE — Transfer of Care (Signed)
Immediate Anesthesia Transfer of Care Note  Patient: Miranda Robertson  Procedure(s) Performed: Procedure(s): CESAREAN SECTION (N/A)  Patient Location: PACU  Anesthesia Type:Spinal  Level of Consciousness: awake, alert  and oriented  Airway & Oxygen Therapy: Patient Spontanous Breathing  Post-op Assessment: Report given to RN and Post -op Vital signs reviewed and stable  Post vital signs: Reviewed and stable  Last Vitals:  Filed Vitals:   05/21/14 0906  BP: 136/88  Pulse: 96  Temp: 36.8 C    Complications: No apparent anesthesia complications

## 2014-05-21 NOTE — Op Note (Signed)
[name] PROCEDURE DATE: 05/21/2014  PREOPERATIVE DIAGNOSES: Intrauterine pregnancy at [redacted]w[redacted]d weeks gestation; previous cesarean section  POSTOPERATIVE DIAGNOSES: The same  PROCEDURE: Repeat Low Transverse Cesarean Section  SURGEON:  Dr. Tinnie Gens  ASSISTANT:  Dr. Fredirick Lathe  ANESTHESIOLOGIST: Dr. Arby Barrette  INDICATIONS: Miranda Robertson is a 24 y.o. G2P1001 at [redacted]w[redacted]d here for cesarean section secondary to the indications listed under preoperative diagnoses, declined trial of labor; please see preoperative note for further details.  The risks of cesarean section were discussed with the patient including but were not limited to: bleeding which may require transfusion or reoperation; infection which may require antibiotics; injury to bowel, bladder, ureters or other surrounding organs; injury to the fetus; need for additional procedures including hysterectomy in the event of a life-threatening hemorrhage; placental abnormalities wth subsequent pregnancies, incisional problems, thromboembolic phenomenon and other postoperative/anesthesia complications.   The patient concurred with the proposed plan, giving informed written consent for the procedure.    FINDINGS:  Viable female infant in cephalic presentation.  Apgars 8 and 9.  Clear amniotic fluid.  Intact placenta, three vessel cord.  Normal uterus, fallopian tubes and ovaries bilaterally.  ANESTHESIA: Spinal INTRAVENOUS FLUIDS: 3300 ml ESTIMATED BLOOD LOSS: 800 ml URINE OUTPUT:  100 ml SPECIMENS: Placenta sent to pathology COMPLICATIONS: None immediate  PROCEDURE IN DETAIL:  The patient preoperatively received intravenous antibiotics and had sequential compression devices applied to her lower extremities.  She was then taken to the operating room where spinal anesthesia was administered and was found to be adequate. She was then placed in a dorsal supine position with a leftward tilt, and prepped and draped in a sterile manner.  A foley  catheter was placed into her bladder and attached to constant gravity.  After an adequate timeout was performed, a Pfannenstiel skin incision was made with scalpel and carried through to the underlying layer of fascia. The fascia was incised in the midline, and this incision was extended bilaterally using the Mayo scissors.  Kocher clamps were applied to the superior aspect of the fascial incision and the underlying rectus muscles were dissected off bluntly. A similar process was carried out on the inferior aspect of the fascial incision. The rectus muscles were separated in the midline bluntly and the peritoneum was entered bluntly. Attention was turned to the lower uterine segment where a low transverse hysterotomy was made with a scalpel and extended bilaterally bluntly.  The infant was successfully delivered, the cord was clamped and cut and the infant was handed over to awaiting neonatology team. Uterine massage was then administered, and the placenta delivered intact with a three-vessel cord. The uterus was then cleared of clot and debris.  The hysterotomy was closed with 0 Vicryl in a running locked fashion, and an imbricating layer was also placed with 0 Vicryl. The pelvis was cleared of all clot and debris. Hemostasis was confirmed on all surfaces.  The peritoneum and the muscles were reapproximated using a single 0 Vicryl interrupted stitches. The fascia was then closed using 0 Vicryl in a running fashion.  The subcutaneous layer was irrigated, then reapproximated with 2-0 plain gut interrupted stitches, and 30 ml of 0.25% Bupivacaine was injected subcutaneously around the incision.  The skin was closed with a 4-0 Vicryl subcuticular stitch. The patient tolerated the procedure well. Sponge, lap, instrument and needle counts were correct x 2.  She was taken to the recovery room in stable condition.   Perry Mount, MD OB Fellow Faculty Practice, [facility]  Health

## 2014-05-21 NOTE — Addendum Note (Signed)
Addendum  created 05/21/14 1701 by Elbert Ewingsolleen S Kase Shughart, CRNA   Modules edited: Charges VN, Notes Section   Notes Section:  File: [id number]

## 2014-05-21 NOTE — Anesthesia Postprocedure Evaluation (Signed)
Anesthesia Post Note  Patient: Miranda RancherRebecca Robertson  Procedure(s) Performed: Procedure(s) (LRB): CESAREAN SECTION (N/A)  Anesthesia type: Spinal  Patient location: PACU  Post pain: Pain level controlled  Post assessment: Post-op Vital signs reviewed  Last Vitals:  Filed Vitals:   05/21/14 1215  BP: 124/69  Pulse: 72  Temp:   Resp: 20    Post vital signs: Reviewed  Level of consciousness: awake  Complications: No apparent anesthesia complications

## 2014-05-22 ENCOUNTER — Encounter (HOSPITAL_COMMUNITY): Payer: Self-pay | Admitting: Family Medicine

## 2014-05-22 DIAGNOSIS — Z98891 History of uterine scar from previous surgery: Secondary | ICD-10-CM

## 2014-05-22 LAB — RPR: RPR: NONREACTIVE
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
## 2014-05-22 NOTE — Progress Notes (Signed)
CSW spoke with intake worker at [place] CPS.  Per intake worker, they have requested that [place] complete assessment since the [name] resides in [place].  Intake worker recommended that CSW follow up with [place].   CSW will follow up with [place] CPS in the morning on 2/4.  

## 2014-05-22 NOTE — Progress Notes (Signed)
Subjective: Postpartum Day 1: Cesarean Delivery [name] is 23y/o G2P1 w/ gHTN s/p rLTCS at 39wks. She reports some nausea while ambulating overnight. Her foley was discontinued at 5am and she has not voided since. No BM as of yet. She has chosen to bottle feed as well as OCPs at discharge. Passing flatulence   Objective: Vital signs in last 24 hours: Temp:  [97.4 F (36.3 C)-98.4 F (36.9 C)] 98.2 F (36.8 C) (02/03 0430) Pulse Rate:  [67-96] 79 (02/03 0430) Resp:  [12-20] 18 (02/03 0430) BP: (104-136)/(54-88) 109/57 mmHg (02/03 0430) SpO2:  [94 %-99 %] 97 % (02/03 0430)  Physical Exam:  General: alert, cooperative and no distress Lochia: appropriate Uterine Fundus: firm Incision: healing well, no significant drainage, no significant erythema DVT Evaluation: Negative Homan's sign. No cords or calf tenderness. No significant calf/ankle edema.   Recent Labs  05/21/14 0810  HGB 12.0  HCT 35.9*    Assessment/Plan: Status post Cesarean section. Doing well postoperatively.  Continue current care.  Audry Piliyler Mohr, PAS2  05/22/2014, 7:55 AM   I, Kathee DeltonIan D Zarielle Cea, MD, have seen and examined this patient and discussed with the PA student. I agree with all listed information.  Kathee DeltonIan D Yesica Kemler, MD,MS,  PGY1 05/22/2014 7:55 AM

## 2014-05-23 NOTE — Progress Notes (Signed)
Subjective: Postpartum Day 2: Cesarean Delivery Miranda Robertson is a 24 y/o G2P2 w/ gHTN s/p rLTCS who experienced no complaints overnight. She is ambulating well. No bowel movements, but is having flatus. She is voiding. Her pain is under control with Motrin and complains of some mild LUQ tenderness upon palpation. She still requests OCPs. Feeding method: Bottle.      Objective: Vital signs in last 24 hours: Temp:  [97.7 F (36.5 C)-98.8 F (37.1 C)] 97.7 F (36.5 C) (02/04 0640) Pulse Rate:  [70-75] 70 (02/04 0640) Resp:  [18] 18 (02/04 0640) BP: (104-115)/(57-74) 115/59 mmHg (02/04 0640)  Physical Exam:  General: alert, cooperative and no distress Lochia: appropriate Uterine Fundus: firm Incision: healing well DVT Evaluation: Negative Homan's sign. No cords or calf tenderness. +1 pitting edema noted on LLE   Recent Labs  05/21/14 0810  HGB 12.0  HCT 35.9*    Assessment/Plan: Status post Cesarean section. Doing well postoperatively.  Continue current care. Plan to d/c either today or tomorrow  Audry PiliMohr, [place] PA-S2  05/23/2014, 7:26 AM  I have seen and examined this patient and I agree with the above. Will plan for discharge tomorrow 2/5 due to CPS being contacted. Cam HaiSHAW, KIMBERLY CNM 9:29 AM 05/23/2014

## 2014-05-23 NOTE — Progress Notes (Addendum)
CSW was notified that Kate SableMegan Beaver is the assigned CPS worker, and that the case has been classified as a "24 hour response".  CSW left voicemail for worker and requested call back in order to collaborate.   Update at 10:30 am: CSW followed up with [name] and FOB in order to provide ongoing support due to CPS report.  [name] and FOB presented as agreeable to CSW visit and openly discussed their ongoing happiness secondary to spending time with the baby.  Per [name], Manuela Neptuneharles Key, after-hours CPS worker came to their room last night and completed a safety plan.  CSW reviewed safety plan, and safety plan stated that the family is not to leave the hospital until assigned worker is able to complete visit/assessment.  [name] and FOB verbalized understanding that this may delay understanding.  [name] and FOB denied significant stress associated with upcoming CPS report. They acknowledged that there is nothing they can do about the situation, and they were open to exploring how to verbalize their goals/desires to parent this child.  CSW continued to assist the family to increase self-confidence by reviewing family strengths, particularily highlighting the [name]'s abilty to self-regulate on 2/3 when CSW shared need to make CPS report.  [name] smiled, and acknowledged that she is proud of herself.    Update at 2:00pm: CSW continues to make attempts to speak with CPS, but has been unsuccessful, messages left.  [name] and FOB reported that CPS worker has been to the hospital, and that it went "well".  CPS worker did not leave any paperwork for CSW to review.  Per FOB, he has arranged to meet the CPS worker at their home at 4:00pm to complete a home visit.  [name] stated that once the home visit is completed, CPS worker will consult with her supervisor, and notify them of the outcome of their assessment.  CSW will confirm/deny this information with CPS worker.  CSW will continue to closely follow.

## 2014-05-23 NOTE — Progress Notes (Signed)
CSW spoke with CPS worker, DonahueMegan Beaver.  CPS confirmed that she will conduct a home visit this afternoon in order to complete her assessment of the [name].  She stated that she will be implementing a "strict safety plan", and she reported that as long as the [name] and FOB are willing to comply with the safety plan, that there will be no barriers to discharge.  CSW and CPS discussed maternal risk factors, and collaborated on what should be included in the safety plan.  CPS reported that she is exploring mental health resources for the [name].  CSW agreed to make a [facility] and a [facility] referral.   CPS reported that [facility] in the morning to complete/finalize the assessment/safety plan and in order to meet with the family.  CPS reported intention to contact CSW when all is finalized.

## 2014-05-24 ENCOUNTER — Other Ambulatory Visit: Payer: Medicaid Other

## 2014-05-24 LAB — CBC
HCT: 30.7 % — ABNORMAL LOW (ref 36.0–46.0)
HEMOGLOBIN: 10.1 g/dL — AB (ref 12.0–15.0)
MCH: 27.4 pg (ref 26.0–34.0)
MCHC: 32.9 g/dL (ref 30.0–36.0)
MCV: 83.4 fL (ref 78.0–100.0)
PLATELETS: 275 10*3/uL (ref 150–400)
RBC: 3.68 MIL/uL — AB (ref 3.87–5.11)
RDW: 16 % — ABNORMAL HIGH (ref 11.5–15.5)
WBC: 8.8 10*3/uL (ref 4.0–10.5)

## 2014-05-24 MED ORDER — OXYCODONE-ACETAMINOPHEN 5-325 MG PO TABS: 1.0000 | ORAL_TABLET | ORAL | Status: DC | PRN

## 2014-05-24 NOTE — Discharge Summary (Signed)
Obstetric Discharge Summary Reason for Admission: cesarean section Prenatal Procedures: NST, Preeclampsia and ultrasound Intrapartum Procedures: cesarean: low cervical, transverse Postpartum Procedures: none Complications-Operative and Postpartum: Social issues requiring CSW and CPS.   Must be cleared by CSW and CPS prior to DC w/ baby.  HEMOGLOBIN  Date Value Ref Range Status  05/21/2014 12.0 12.0 - 15.0 g/dL Final  45/40/981111/19/2015 91.432.5 g/dL Final   HCT  Date Value Ref Range Status  05/21/2014 35.9* 36.0 - 46.0 % Final  03/07/2014 11 % Final    Physical Exam:  General: alert, cooperative, appears stated age, no distress and moderately obese Lochia: appropriate Uterine Fundus: firm Incision: healing well, no significant drainage, no dehiscence, no significant erythema DVT Evaluation: No evidence of DVT seen on physical exam. No cords or calf tenderness. No significant calf/ankle edema.  Discharge Diagnoses: Term Pregnancy-delivered; gHTN  Discharge Information: Date: 05/24/2014 Activity: pelvic rest Diet: routine Medications: PNV and Percocet Condition: stable Instructions: refer to practice specific booklet Discharge to: home Follow-up Information    Follow up with [facility]. Schedule an appointment as soon as possible for a visit in 4 weeks.   Specialty:  Obstetrics and Gynecology   Contact information:   [address] [place] [id number] [phone]      Newborn Data: Live born female  Birth Weight: 8 lb 5 oz (3771 g) APGAR: 8, 9  Home with mother.  Kathee DeltonMcKeag, Ian D 05/24/2014, 7:43 AM   I have seen and examined this patient and agree the above assessment. CRESENZO-DISHMAN,Darryon Bastin 05/28/2014 9:10 AM

## 2014-05-24 NOTE — Discharge Instructions (Signed)

## 2014-05-24 NOTE — Progress Notes (Signed)
CSW spoke with CPS worker at the hospital.  CPS worker reported that there are no barriers to discharge.  CSW has made a copy of the safety plan, and it has been placed in the baby's chart.    The safety plan outlines that the [name] must participate in all referrals (parenting and mental health) made by CSW and CPS worker. CSW completed referrals for [facility] and [facility], and CPS completed a referral for [facility].  Safety plan also mandates that the [name] cannot be left alone with the baby.  CSW met with the [name] after CPS left the hospital to explore her thoughts and feelings related to the safety plan.  [name] presented as tearful due to the clause of needing 24-hour supervision.  She discussed how she is sad that she cannot take a walk with the baby, or even take the baby to the MD alone.  CSW validated her feelings and the loss of expectations, but [name] also presents with insight that it is a short-term situation until she can demonstrate readiness to parent alone.  She shared that she knows that services will demonstrate her ability to parent and will help her to be the parent she wants to be, but she yet continues to feel like she is being told that she "cannot do it".  [name] presented with motivation to demonstrate that she is able to parent, and discussed various behaviors that she has exhibited in the hospital that help her to remind herself that she is a good mother.    No barriers to discharge. 

## 2014-05-27 ENCOUNTER — Other Ambulatory Visit: Payer: Medicaid Other

## 2014-05-27 ENCOUNTER — Encounter: Payer: Self-pay | Admitting: Family Medicine

## 2014-05-28 ENCOUNTER — Telehealth: Payer: Self-pay | Admitting: *Deleted

## 2014-05-28 NOTE — Telephone Encounter (Signed)
Pt called and said that she has some concerns about her csection. I called her back and she was concerned about her bleeding. She stated that she isn't saturating pads but does have some small clots from time to time. Advised her that bleeding would improve over time and if it did not to let us know or go to MAU. Pt is agreeable and will call back if she needs further assistance.

## 2014-05-31 ENCOUNTER — Telehealth: Payer: Self-pay

## 2014-05-31 NOTE — Telephone Encounter (Signed)
Patient called stating she has questions about her CS. Called patient who states she has noticed some yellow/green discharge coming from incision, redness, and that it feels like "its going to rip open." Denies any fever and reports that she has been keeping incision clean and dry as best she can. Informed patient that green/yellow discharge and redness is worrisome for infection despite the fact that she does not have a fever. Informed her clinic is now closed and therefore she should come to MAU for evaluation. Patient verbalized understanding and gratitude. No further questions.

## 2014-06-04 ENCOUNTER — Inpatient Hospital Stay (HOSPITAL_COMMUNITY)
Admission: AD | Admit: 2014-06-04 | Discharge: 2014-06-04 | Disposition: A | Discharge status: Home / Self Care | Payer: Medicaid Other | Source: Ambulatory Visit | Attending: Family Medicine | Admitting: Family Medicine

## 2014-06-04 ENCOUNTER — Encounter (HOSPITAL_COMMUNITY): Payer: Self-pay | Admitting: *Deleted

## 2014-06-04 ENCOUNTER — Ambulatory Visit: Payer: Medicaid Other

## 2014-06-04 DIAGNOSIS — L7682 Other postprocedural complications of skin and subcutaneous tissue: Secondary | ICD-10-CM

## 2014-06-04 DIAGNOSIS — O860 Infection of obstetric surgical wound: Secondary | ICD-10-CM | POA: Diagnosis not present

## 2014-06-04 DIAGNOSIS — R350 Frequency of micturition: Secondary | ICD-10-CM

## 2014-06-04 DIAGNOSIS — Z87891 Personal history of nicotine dependence: Secondary | ICD-10-CM | POA: Diagnosis not present

## 2014-06-04 LAB — URINALYSIS, ROUTINE W REFLEX MICROSCOPIC
BILIRUBIN URINE: NEGATIVE
Glucose, UA: NEGATIVE mg/dL
Ketones, ur: NEGATIVE mg/dL
Nitrite: NEGATIVE
PH: 6 (ref 5.0–8.0)
Protein, ur: NEGATIVE mg/dL
SPECIFIC GRAVITY, URINE: 1.025 (ref 1.005–1.030)
Urobilinogen, UA: 0.2 mg/dL (ref 0.0–1.0)

## 2014-06-04 LAB — URINE MICROSCOPIC-ADD ON

## 2014-06-04 NOTE — MAU Note (Signed)
Steri strips removed (5 remained).  Incision is healing well. On left side, 2 areas noted where it is not well approximated. Scant red spots noted on pad from these areas.

## 2014-06-04 NOTE — Discharge Instructions (Signed)
Incision Care °An incision is when a surgeon cuts into your body tissues. After surgery, the incision needs to be cared for properly to prevent infection.  °HOME CARE INSTRUCTIONS  °· Take all medicine as directed by your caregiver. Only take over-the-counter or prescription medicines for pain, discomfort, or fever as directed by your caregiver. °· Do not remove your bandage (dressing) or get your incision wet until your surgeon gives you permission. In the event that your dressing becomes wet, dirty, or starts to smell, change the dressing and call your surgeon for instructions as soon as possible. °· Take showers. Do not take tub baths, swim, or do anything that may soak the wound until it is healed. °· Resume your normal diet and activities as directed or allowed. °· Avoid lifting any weight until you are instructed otherwise. °· Use anti-itch antihistamine medicine as directed by your caregiver. The wound may itch when it is healing. Do not pick or scratch at the wound. °· Follow up with your caregiver for stitch (suture) or staple removal as directed. °· Drink enough fluids to keep your urine clear or pale yellow. °SEEK MEDICAL CARE IF:  °· You have redness, swelling, or increasing pain in the wound that is not controlled with medicine. °· You have drainage, blood, or pus coming from the wound that lasts longer than 1 day. °· You develop muscle aches, chills, or a general ill feeling. °· You notice a bad smell coming from the wound or dressing. °· Your wound edges separate after the sutures, staples, or skin adhesive strips have been removed. °· You develop persistent nausea or vomiting. °SEEK IMMEDIATE MEDICAL CARE IF:  °· You have a fever. °· You develop a rash. °· You develop dizzy episodes or faint while standing. °· You have difficulty breathing. °· You develop any reaction or side effects to medicine given. °MAKE SURE YOU:  °· Understand these instructions. °· Will watch your condition. °· Will get help  right away if you are not doing well or get worse. °Document Released: 10/23/2004 Document Revised: 06/28/2011 Document Reviewed: 05/30/2013 °ExitCare® Patient Information ©2015 [employer]. This information is not intended to replace advice given to you by your health care provider. Make sure you discuss any questions you have with your health care provider. ° ° °

## 2014-06-04 NOTE — MAU Note (Signed)
C/s on 02/02.  Home nurse removed honeycomb dressing 02/10.  Has been having some drainage, was yellowish/ green.  Nurse thought it looked like it was opening.

## 2014-06-04 NOTE — MAU Provider Note (Signed)
Chief Complaint: Post-op Problem   None    SUBJECTIVE HPI: Miranda Robertson is a 24 y.o. G2P2002 on POD#14 after RLTCS who presents to maternity admissions reporting urinary frequency and report of burning at incision site.  She was discharged from the hospital with honeycomb dressing and told it would eventually come off by itself.  Her home health nurse removed this dressing on 05/29/14.  She has steristrips in place on arrival in MAU today.  She reports some urinary pressure and frequency for the last few days but denies dysuria.  She reports moderate lochia most days with some lighter days, and denies vaginal itching/burning, h/a, dizziness, n/v, or fever/chills.     Past Medical History  Diagnosis Date  . HSV (herpes simplex virus) infection   . Asthma     Childhood  . Mental disorder     bipolar, depression, mild menatl retardation, suicidal thoughts in 2010   Past Surgical History  Procedure Laterality Date  . Cholecystectomy    . Wisdom tooth extraction    . Cesarean section  06/09/12    FTP  . Cesarean section N/A 05/21/2014    Procedure: CESAREAN SECTION;  Surgeon: Reva Boresanya S Pratt, MD;  Location: [facility];  Service: Obstetrics;  Laterality: N/A;   History   Social History  . Marital Status: Single    Spouse Name: N/A  . Number of Children: N/A  . Years of Education: N/A   Occupational History  . Not on file.   Social History Main Topics  . Smoking status: Former Games developermoker  . Smokeless tobacco: Never Used  . Alcohol Use: No  . Drug Use: No  . Sexual Activity: Yes   Other Topics Concern  . Not on file   Social History Narrative   No current facility-administered medications on file prior to encounter.   Current Outpatient Prescriptions on File Prior to Encounter  Medication Sig Dispense Refill  . oxyCODONE-acetaminophen (PERCOCET/ROXICET) 5-325 MG per tablet Take 1 tablet by mouth every 4 (four) hours as needed (for pain scale less than 7). 20 tablet 0  . prenatal  vitamin w/FE, FA (PRENATAL 1 + 1) 27-1 MG TABS tablet Take 1 tablet by mouth daily at 12 noon. 30 each 0  . albuterol (PROVENTIL HFA;VENTOLIN HFA) 108 (90 BASE) MCG/ACT inhaler Inhale 2 puffs into the lungs every 6 (six) hours as needed for wheezing or shortness of breath.    . valACYclovir (VALTREX) 500 MG tablet Take 2 tablets (1,000 mg total) by mouth daily. (Patient taking differently: Take 1,000 mg by mouth daily as needed (outbreaks). ) 60 tablet 3   No Known Allergies  ROS: Pertinent items in HPI  OBJECTIVE Blood pressure 132/74, pulse 72, temperature 98.5 F (36.9 C), temperature source Oral, resp. rate 18, last menstrual period 08/21/2013, not currently breastfeeding. GENERAL: Well-developed, well-nourished female in no acute distress.  HEENT: Normocephalic HEART: normal rate RESP: normal effort ABDOMEN: Soft, non-tender, incision with good approximation, no edema.  Clear drainage noted without odor and slight erythema at 2 sites at edges of incision where steristrips removed today.  EXTREMITIES: Nontender, no edema NEURO: Alert and oriented    LAB RESULTS Results for orders placed or performed during the hospital encounter of 06/04/14 (from the past 24 hour(s))  Urinalysis, Routine w reflex microscopic     Status: Abnormal   Collection Time: 06/04/14  5:20 PM  Result Value Ref Range   Color, Urine YELLOW YELLOW   APPearance CLEAR CLEAR   Specific Gravity,  Urine 1.025 1.005 - 1.030   pH 6.0 5.0 - 8.0   Glucose, UA NEGATIVE NEGATIVE mg/dL   Hgb urine dipstick MODERATE (A) NEGATIVE   Bilirubin Urine NEGATIVE NEGATIVE   Ketones, ur NEGATIVE NEGATIVE mg/dL   Protein, ur NEGATIVE NEGATIVE mg/dL   Urobilinogen, UA 0.2 0.0 - 1.0 mg/dL   Nitrite NEGATIVE NEGATIVE   Leukocytes, UA SMALL (A) NEGATIVE  Urine microscopic-add on     Status: None   Collection Time: 06/04/14  5:20 PM  Result Value Ref Range   Squamous Epithelial / LPF RARE RARE   WBC, UA 3-6 <3 WBC/hpf   RBC /  HPF 0-2 <3 RBC/hpf   ASSESSMENT 1. Incisional pain   2. Urinary frequency     PLAN Discharge home Keep incision clean and dry Urine sent for culture F/U as scheduled in [facility]       Follow-up Information    Follow up with [facility].   Specialty:  Obstetrics and Gynecology   Why:  As scheduled   Contact information:   [address] [place] [address] [phone]      Follow up with [facility].   Why:  As needed for emergencies   Contact information:   [address] [id number] [place] [address] [phone]      Sharen Counter Certified Nurse-Midwife 06/04/2014  6:22 PM

## 2014-06-06 LAB — URINE CULTURE: Colony Count: 70000

## 2014-07-01 ENCOUNTER — Ambulatory Visit (INDEPENDENT_AMBULATORY_CARE_PROVIDER_SITE_OTHER): Payer: Medicaid Other | Admitting: Family Medicine

## 2014-07-01 ENCOUNTER — Encounter: Payer: Self-pay | Admitting: Family Medicine

## 2014-07-01 VITALS — BP 116/77 | HR 83 | Ht 66.0 in | Wt 238.8 lb

## 2014-07-01 DIAGNOSIS — Z30014 Encounter for initial prescription of intrauterine contraceptive device: Secondary | ICD-10-CM

## 2014-07-01 LAB — POCT URINALYSIS DIP (DEVICE)
BILIRUBIN URINE: NEGATIVE
GLUCOSE, UA: NEGATIVE mg/dL
HGB URINE DIPSTICK: NEGATIVE
Ketones, ur: NEGATIVE mg/dL
Nitrite: NEGATIVE
Protein, ur: NEGATIVE mg/dL
UROBILINOGEN UA: 0.2 mg/dL (ref 0.0–1.0)
pH: 5.5 (ref 5.0–8.0)

## 2014-07-01 LAB — POCT PREGNANCY, URINE: PREG TEST UR: NEGATIVE

## 2014-07-01 MED ORDER — LEVONORGESTREL 20 MCG/24HR IU IUD
INTRAUTERINE_SYSTEM | Freq: Once | INTRAUTERINE | Status: AC
  Administered 2014-07-01: 1 via INTRAUTERINE

## 2014-07-01 NOTE — Progress Notes (Signed)
Patient reports she has concerns about her csection incision and also reports bright red bleeding after bowel movements.

## 2014-07-01 NOTE — Progress Notes (Signed)
Subjective:     Miranda RancherRebecca Robertson is a 24 y.o. female who presents for a postpartum visit. She is 6 weeks postpartum following a repeat LTCS. I have fully reviewed the prenatal and intrapartum course. The delivery was at 39 gestational weeks. Outcome: repeat cesarean section, low transverse incision. Anesthesia: spinal. Postpartum course has been complicated by incision pain. Baby's course has been uncomplicated. Baby is feeding by bottle - Similac Sensitive RS. Bleeding staining only. Bowel function is every other day, has noted some bleeding with bowel movements, has pain BM, denies hemorrhoids. Bladder function is normal. Patient is not sexually active. Contraception method is abstinence. Postpartum depression screening: negative.  The following portions of the patient's history were reviewed and updated as appropriate: allergies, current medications, past family history, past medical history, past social history, past surgical history and problem list.  Review of Systems A comprehensive review of systems was negative except   .  No LMP recorded. Last sexual intercourse was over 1 week ago, and pregnancy test today was negative, using condoms currently  The risks and benefits of the method and placement have been thouroughly reviewed with the patient and all questions were answered.  Specifically the patient is aware of failure rate of 04/998, expulsion of the IUD and of possible perforation.  The patient is aware of irregular bleeding due to the method and understands the incidence of irregular bleeding diminishes with time.  Signed copy of informed consent in chart.   Time out was performed.  A Pederson speculum was placed in the vagina.  The cervix was visualized, prepped using Betadin, and grasped with a single tooth tenaculum. The uterus was found to be neutral and it sounded to 7 cm.  Mirena IUD placed per manufacturer's recommendations.   The strings were trimmed to 3 cm.  Sonogram was  performed and the proper placement of the IUD was verified via transvaginal u/s.   The patient was given post procedure instructions, including signs and symptoms of infection and to check for the strings after each menses or each month, and refraining from intercourse or anything in the vagina for 3 days.  She was given a Mirena care card with date Mirena placed, and date Mirena to be removed.  Objective:    BP 116/77 mmHg  Pulse 83  Ht 5\' 6"  (1.676 m)  Wt 238 lb 12.8 oz (108.319 kg)  BMI 38.56 kg/m2  Breastfeeding? No  General:  alert and cooperative   Breasts:  not inspected  Lungs: normal effort  Heart:  normal rate  Abdomen: soft, nontender   Vulva:  normal  Vagina: normal vagina, no discharge, exudate, lesion, or erythema  Cervix:  no cervical motion tenderness and no lesions  Corpus: normal  Adnexa:  normal adnexa  Rectal Exam: external hemorrhoids        Assessment:     Normal postpartum exam. Pap smear not done at today's visit.   Plan:    1. Contraception: IUD, placed today after counseling 2. Hemorrhoids: advised increased hydration, stool softeners.  If not improved at next visit rx anusol HC 3. Follow up in: 4-6 weeks for string check and trim if needed.

## 2014-07-16 ENCOUNTER — Encounter: Payer: Self-pay | Admitting: *Deleted

## 2014-07-29 ENCOUNTER — Ambulatory Visit (INDEPENDENT_AMBULATORY_CARE_PROVIDER_SITE_OTHER): Payer: Medicaid Other | Admitting: Family Medicine

## 2014-07-29 ENCOUNTER — Encounter: Payer: Self-pay | Admitting: Family Medicine

## 2014-07-29 VITALS — BP 112/73 | HR 81 | Ht 66.0 in | Wt 249.8 lb

## 2014-07-29 DIAGNOSIS — Z30431 Encounter for routine checking of intrauterine contraceptive device: Secondary | ICD-10-CM

## 2014-07-29 NOTE — Progress Notes (Signed)
   Subjective:    Patient ID: Miranda Robertson, female    DOB: 09/30/1990, 24 y.o.   MRN: [id number]  HPI Seen for string check - no problems with IUD. Denies cramping, bleeding, spotting, pelvic pain.  Strings are not bothering her.     Review of Systems     Objective:   Physical Exam  Constitutional: She appears well-developed and well-nourished.  Pulmonary/Chest: Effort normal.  Genitourinary: There is no rash, tenderness or lesion on the right labia. There is no rash, tenderness or lesion on the left labia.         Assessment & Plan:   Problem List Items Addressed This Visit    None    Visit Diagnoses    IUD check up    -  Primary      No problems with IUD.  F/u in 1 year for annual exam

## 2014-09-23 ENCOUNTER — Telehealth: Payer: Self-pay

## 2014-09-23 NOTE — Telephone Encounter (Signed)
Patient called and stated she has questions about her Mirena because she had it placed two months ago and is irregularly bleeding. Called patient who sates she had her period, lasted 8 days, off for 3 days and now bleeding again, spotting but at times heavy. Informed patient that irregular bleeding is normal for the first 6 months, advised she come to MAU if she is ever saturating more than one pad an hour and advised that if she is still having problems after 6 months to schedule an appointment. Patient verbalized understanding and gratitude. No further questions or concerns.

## 2014-12-05 ENCOUNTER — Telehealth: Payer: Self-pay | Admitting: *Deleted

## 2014-12-05 NOTE — Telephone Encounter (Signed)
Received message left on nurse line on 12/04/14 at 1504.  Patient states she has had a mirena for approximately 6 months and thinks she might be having complications with it.  Requests a return call.

## 2014-12-05 NOTE — Telephone Encounter (Signed)
Called Mariely back and she reports she had the IUD put in about 6 months ago- per chart review was 06/2014 at postpartum visit. States for the last week or so she has been having back pain and severe cramps and has to " push her urine out" sometimes. Also c/o trouble breathing.  She has a history of asthma so we discussed the trouble breathing is not related to IUD. I advised her to see her PCP or urgent care about the breathing .  We discussed she could have a UTI because UTI can cause back pain, and trouble urinating. and I advised her to come by and let us check her urine for that, if negative then can make an appointment to see provider.  She states she is in [place] right now and has trouble with transportation and wanted to wait until next week.  I informed her if she has a UTI, it will only get worse and I advised her to go to an urgent care or her PCP in [place] since she has medicaid, but if she prefers can make appointment next week. She decided she will go to an urgent care and get her urine checked, if negative she will call and make appointment with [place].

## 2015-05-28 ENCOUNTER — Encounter: Payer: Self-pay | Admitting: Obstetrics and Gynecology

## 2015-05-28 ENCOUNTER — Ambulatory Visit (INDEPENDENT_AMBULATORY_CARE_PROVIDER_SITE_OTHER): Payer: Medicaid Other | Admitting: Obstetrics and Gynecology

## 2015-05-28 VITALS — BP 115/90 | HR 89 | Temp 98.4°F | Ht 66.0 in | Wt 280.0 lb

## 2015-05-28 DIAGNOSIS — Z3202 Encounter for pregnancy test, result negative: Secondary | ICD-10-CM | POA: Diagnosis not present

## 2015-05-28 DIAGNOSIS — Z30432 Encounter for removal of intrauterine contraceptive device: Secondary | ICD-10-CM

## 2015-05-28 DIAGNOSIS — Z3049 Encounter for surveillance of other contraceptives: Secondary | ICD-10-CM

## 2015-05-28 LAB — POCT PREGNANCY, URINE: PREG TEST UR: NEGATIVE

## 2015-05-28 MED ORDER — ETONOGESTREL-ETHINYL ESTRADIOL 0.12-0.015 MG/24HR VA RING: VAGINAL_RING | VAGINAL | Status: DC

## 2015-05-28 NOTE — Progress Notes (Signed)
Urine specimen was discarded prior to order for urine culture form Dr. Jolayne Panther. Pt will be contacted tomorrow for recollection.

## 2015-05-28 NOTE — Addendum Note (Signed)
Addended by: Jill Side on: 05/28/2015 05:14 PM   Modules accepted: Orders

## 2015-05-28 NOTE — Progress Notes (Signed)
Patient ID: Miranda Robertson, female   DOB: [birth year], 25 y.o.   MRN: [id number] 25 yo G2P2 presenting today for IUD removal. Patient has had the IUD inserted in 06/2014. She desires removal secondary to weight gain which she attributes to the device.  Patient also reports experiencing some frequency  IUD removal   After an adequate timeout was performed, attention was turned to her pelvis where a speculum was placed in the vagina.  The cervix and IUD strings were not visualized at the os, grasped with a Kelly forceps and the IUD was removed without complications.   A/P 25 yo s/p IUD removal - Patient plans on using NuvaRing for contraception. rx provided - weight loss management discussed - Urine culture sent

## 2015-05-29 ENCOUNTER — Telehealth: Payer: Self-pay | Admitting: *Deleted

## 2015-05-29 MED ORDER — SULFAMETHOXAZOLE-TRIMETHOPRIM 800-160 MG PO TABS: 1.0000 | ORAL_TABLET | Freq: Two times a day (BID) | ORAL | Status: DC

## 2015-05-29 NOTE — Telephone Encounter (Signed)
Called pt and advised that her urine specimen was not able to be sent to lab yesterday as ordered. I asked if she could come today or tomorrow to submit a sample. She stated that she cannot come in tomorrow but will try to come before closing today. Pt also states that she is not feeling as much pelvic pressure since the IUD was removed yesterday but she is still having frequency of urination. She denies pain or burning with urination. Pt was offered Rx for antibiotic if desired (per standing order) and she stated that she would like the Rx to be sent to her pharmacy.

## 2015-06-10 ENCOUNTER — Emergency Department (HOSPITAL_COMMUNITY)
Admission: EM | Admit: 2015-06-10 | Discharge: 2015-06-10 | Disposition: A | Discharge status: Home / Self Care | Payer: Medicaid Other | Attending: Emergency Medicine | Admitting: Emergency Medicine

## 2015-06-10 ENCOUNTER — Encounter (HOSPITAL_COMMUNITY): Payer: Self-pay | Admitting: Emergency Medicine

## 2015-06-10 DIAGNOSIS — Z87891 Personal history of nicotine dependence: Secondary | ICD-10-CM | POA: Diagnosis not present

## 2015-06-10 DIAGNOSIS — Z8619 Personal history of other infectious and parasitic diseases: Secondary | ICD-10-CM | POA: Diagnosis not present

## 2015-06-10 DIAGNOSIS — Z3202 Encounter for pregnancy test, result negative: Secondary | ICD-10-CM | POA: Insufficient documentation

## 2015-06-10 DIAGNOSIS — J45909 Unspecified asthma, uncomplicated: Secondary | ICD-10-CM | POA: Diagnosis not present

## 2015-06-10 DIAGNOSIS — Z8659 Personal history of other mental and behavioral disorders: Secondary | ICD-10-CM | POA: Diagnosis not present

## 2015-06-10 DIAGNOSIS — R112 Nausea with vomiting, unspecified: Secondary | ICD-10-CM | POA: Diagnosis present

## 2015-06-10 LAB — URINE MICROSCOPIC-ADD ON
RBC / HPF: NONE SEEN RBC/hpf (ref 0–5)
WBC, UA: NONE SEEN WBC/hpf (ref 0–5)

## 2015-06-10 LAB — COMPREHENSIVE METABOLIC PANEL
ALT: 17 U/L (ref 14–54)
AST: 20 U/L (ref 15–41)
Albumin: 3.9 g/dL (ref 3.5–5.0)
Alkaline Phosphatase: 72 U/L (ref 38–126)
Anion gap: 8 (ref 5–15)
BUN: 12 mg/dL (ref 6–20)
CHLORIDE: 110 mmol/L (ref 101–111)
CO2: 21 mmol/L — AB (ref 22–32)
CREATININE: 0.78 mg/dL (ref 0.44–1.00)
Calcium: 9.3 mg/dL (ref 8.9–10.3)
GFR calc Af Amer: >60 mL/min (ref >60)
GFR calc non Af Amer: >60 mL/min (ref >60)
Glucose, Bld: 120 mg/dL — ABNORMAL HIGH (ref 65–99)
Potassium: 3.7 mmol/L (ref 3.5–5.1)
SODIUM: 139 mmol/L (ref 135–145)
Total Bilirubin: 0.4 mg/dL (ref 0.3–1.2)
Total Protein: 7.7 g/dL (ref 6.5–8.1)

## 2015-06-10 LAB — URINALYSIS, ROUTINE W REFLEX MICROSCOPIC
BILIRUBIN URINE: NEGATIVE
GLUCOSE, UA: NEGATIVE mg/dL
HGB URINE DIPSTICK: NEGATIVE
KETONES UR: 15 mg/dL — AB
Leukocytes, UA: NEGATIVE
Nitrite: NEGATIVE
PROTEIN: 30 mg/dL — AB
Specific Gravity, Urine: 1.034 — ABNORMAL HIGH (ref 1.005–1.030)
pH: 5 (ref 5.0–8.0)

## 2015-06-10 LAB — LIPASE, BLOOD: LIPASE: 24 U/L (ref 11–51)

## 2015-06-10 LAB — CBC
HCT: 38.3 % (ref 36.0–46.0)
Hemoglobin: 12.4 g/dL (ref 12.0–15.0)
MCH: 25.9 pg — ABNORMAL LOW (ref 26.0–34.0)
MCHC: 32.4 g/dL (ref 30.0–36.0)
MCV: 80 fL (ref 78.0–100.0)
Platelets: 353 10*3/uL (ref 150–400)
RBC: 4.79 MIL/uL (ref 3.87–5.11)
RDW: 14.6 % (ref 11.5–15.5)
WBC: 16.9 10*3/uL — ABNORMAL HIGH (ref 4.0–10.5)

## 2015-06-10 LAB — POC URINE PREG, ED: Preg Test, Ur: NEGATIVE

## 2015-06-10 MED ORDER — ONDANSETRON 4 MG PO TBDP
4.0000 mg | ORAL_TABLET | Freq: Once | ORAL | Status: AC | PRN
  Administered 2015-06-10: 4 mg via ORAL
  Filled 2015-06-10: qty 1

## 2015-06-10 MED ORDER — ONDANSETRON 4 MG PO TBDP
ORAL_TABLET | ORAL | Status: DC
Start: 2015-06-10 — End: 2015-06-10
  Filled 2015-06-10: qty 1

## 2015-06-10 NOTE — ED Notes (Signed)
Pt given ginger ale and crackers. Drank with no problems. Asking for more gingerale.

## 2015-06-10 NOTE — ED Provider Notes (Signed)
CSN: [id number]     Arrival date & time 06/10/15  0327 History   First MD Initiated Contact with Patient 06/10/15 0915     Chief Complaint  Patient presents with  . Nausea  . Emesis     (Consider location/radiation/quality/duration/timing/severity/associated sxs/prior Treatment) Patient is a 25 y.o. female presenting with vomiting. The history is provided by the patient.  Emesis Severity:  Moderate Duration:  1 day Timing:  Constant Quality:  Stomach contents Progression:  Partially resolved Chronicity:  New Recent urination:  Normal Relieved by:  Nothing Worsened by:  Nothing tried Ineffective treatments:  None tried Associated symptoms: cough (this morning)   Associated symptoms: no abdominal pain and no fever   Risk factors: sick contacts (fiance came to ED last night with similar symptoms.)   Risk factors: no diabetes     Past Medical History  Diagnosis Date  . HSV (herpes simplex virus) infection   . Asthma     Childhood  . Mental disorder     bipolar, depression, mild menatl retardation, suicidal thoughts in 2010   Past Surgical History  Procedure Laterality Date  . Cholecystectomy    . Wisdom tooth extraction    . Cesarean section  06/09/12    FTP  . Cesarean section N/A 05/21/2014    Procedure: CESAREAN SECTION;  Surgeon: Reva Bores, MD;  Location: [facility];  Service: Obstetrics;  Laterality: N/A;   History reviewed. No pertinent family history. Social History  Substance Use Topics  . Smoking status: Former Games developer  . Smokeless tobacco: Never Used  . Alcohol Use: No   OB History    Gravida Para Term Preterm AB TAB SAB Ectopic Multiple Living   0 2     Review of Systems  Gastrointestinal: Positive for vomiting. Negative for abdominal pain.  All other systems reviewed and are negative.     Allergies  Review of patient's allergies indicates no known allergies.  Home Medications   Prior to Admission medications   Medication Sig Start  Date End Date Taking? Authorizing Provider  diphenhydrAMINE (BENADRYL) 25 MG tablet Take 25 mg by mouth every 6 (six) hours as needed for sleep.   Yes Historical Provider, MD  etonogestrel-ethinyl estradiol (NUVARING) 0.12-0.015 MG/24HR vaginal ring Insert vaginally and leave in place for 3 consecutive weeks, then remove for 1 week. 05/28/15  Yes Peggy Constant, MD   BP 128/72 mmHg  Pulse 99  Temp(Src) 98.2 F (36.8 C) (Oral)  Resp 20  SpO2 99%  LMP 06/03/2015 Physical Exam  Constitutional: She is oriented to person, place, and time. She appears well-developed and well-nourished. No distress.  HENT:  Head: Normocephalic.  Eyes: Conjunctivae are normal.  Neck: Neck supple. No tracheal deviation present.  Cardiovascular: Normal rate, regular rhythm and normal heart sounds.   Pulmonary/Chest: Effort normal and breath sounds normal. No respiratory distress.  Abdominal: Soft. She exhibits no distension. There is no tenderness. There is no rebound and no guarding.  Neurological: She is alert and oriented to person, place, and time.  Skin: Skin is warm and dry.  Psychiatric: She has a normal mood and affect.  Vitals reviewed.   ED Course  Procedures (including critical care time) Labs Review Labs Reviewed  COMPREHENSIVE METABOLIC PANEL - Abnormal; Notable for the following:    CO2 21 (*)    Glucose, Bld 120 (*)    All other components within normal limits  CBC - Abnormal; Notable for  the following:    WBC 16.9 (*)    MCH 25.9 (*)    All other components within normal limits  URINALYSIS, ROUTINE W REFLEX MICROSCOPIC (NOT AT [facility]) - Abnormal; Notable for the following:    Color, Urine AMBER (*)    APPearance TURBID (*)    Specific Gravity, Urine 1.034 (*)    Ketones, ur 15 (*)    Protein, ur 30 (*)    All other components within normal limits  URINE MICROSCOPIC-ADD ON - Abnormal; Notable for the following:    Squamous Epithelial / LPF 0-5 (*)    Bacteria, UA MANY (*)    All other  components within normal limits  LIPASE, BLOOD  POC URINE PREG, ED    Imaging Review No results found. I have personally reviewed and evaluated these images and lab results as part of my medical decision-making.   EKG Interpretation None      MDM   Final diagnoses:  Non-intractable vomiting with nausea, vomiting of unspecified type    Patient presents with emesis starting last night. Multiple episodes, difficulty holding down food and fluids by mouth, mild epigastric tenderness and pain following onset of vomiting. No fever. MMM, not lethargic appearing, no nuchal rigidity, confusion or obtundation to suggest meningitis. No diarrhea, no suspect food intake, no change in diet. Most likely viral gastritis or other self-limited process by history and clinical appearance. Zofran and po challenge with good relief of symptoms, Patient needs to establish primary care in the area and was provided contact information to do so. Supportive care until likely spontaneous resolution. Patient ate [place] sandwich and drink ginger ale without difficulty. Return precautions discussed for inability to tolerate po fluids or concerning changes in severity or quality of abdominal pain.      Lyndal Pulley, MD 06/10/15 1001

## 2015-06-10 NOTE — Discharge Instructions (Signed)

## 2015-06-10 NOTE — ED Notes (Signed)
Pt was visiting with another patient (boyfriend) when she began to vomit; pt requesting treatment at this time

## 2015-07-25 ENCOUNTER — Telehealth: Payer: Self-pay | Admitting: Family Medicine

## 2015-07-25 NOTE — Telephone Encounter (Signed)
Please call patient she has questions regarding her nuva-ring placement and also has questions due to missed period and not feeling well.

## 2015-07-30 NOTE — Telephone Encounter (Signed)
Called patient, no answer- left message stating we are trying to reach you to return your phone call to answer any questions you may have for us. Please call us back at the clinics

## 2015-12-30 ENCOUNTER — Emergency Department (HOSPITAL_COMMUNITY)
Admission: EM | Admit: 2015-12-30 | Discharge: 2015-12-30 | Disposition: A | Discharge status: Home / Self Care | Payer: Medicaid Other | Attending: Emergency Medicine | Admitting: Emergency Medicine

## 2015-12-30 ENCOUNTER — Encounter (HOSPITAL_COMMUNITY): Payer: Self-pay | Admitting: Emergency Medicine

## 2015-12-30 ENCOUNTER — Emergency Department (HOSPITAL_COMMUNITY): Payer: Medicaid Other

## 2015-12-30 DIAGNOSIS — Z87891 Personal history of nicotine dependence: Secondary | ICD-10-CM | POA: Diagnosis not present

## 2015-12-30 DIAGNOSIS — R05 Cough: Secondary | ICD-10-CM

## 2015-12-30 DIAGNOSIS — R059 Cough, unspecified: Secondary | ICD-10-CM

## 2015-12-30 DIAGNOSIS — R590 Localized enlarged lymph nodes: Secondary | ICD-10-CM | POA: Insufficient documentation

## 2015-12-30 MED ORDER — BENZONATATE 100 MG PO CAPS: 100.0000 mg | ORAL_CAPSULE | Freq: Three times a day (TID) | ORAL | 0 refills | Status: DC | PRN

## 2015-12-30 NOTE — ED Provider Notes (Signed)
[facility]-EMERGENCY DEPT Provider Note   CSN: [id number] Arrival date & time: 12/30/15  1320   By signing my name below, I, Avnee Patel, attest that this documentation has been prepared under the direction and in the presence of  Fayrene HelperBowie Hildreth Robart, PA-C. Electronically Signed: Clovis PuAvnee Patel, ED Scribe. 12/30/15. 3:08 PM.   History   Chief Complaint Chief Complaint  Patient presents with  . Cough    cough x 5 days  . Lymphadenopathy    swelling both sides of neck    The history is provided by the patient. No language interpreter was used.    HPI Comments:  Miranda Robertson is a 25 y.o. female who presents to the Emergency Department complaining of a gradually worsening, constant, dry cough onset 2 months ago. She notes associated chills and swelling to bilateral neck for the past few days. Pt notes the cough worsened 5 days ago. She states the cough is worse in the morning but is otherwise constant throughout the day. Pt states her significant other had pneumonia 2 months ago and her child, who is in daycare, was recently sick. She denies SOB, fevers, rhinorrhea, sore throat, vision changes, or hearing changes. No alleviating factors noted. Pt denies a hx of blood clots and no significant risk factors.  She also denies taking any daily medication, specifically no ACEi. Does have hx of childhood asthma but denies wheezing.   Past Medical History:  Diagnosis Date  . Asthma    Childhood  . HSV (herpes simplex virus) infection   . Mental disorder    bipolar, depression, mild menatl retardation, suicidal thoughts in 2010    Patient Active Problem List   Diagnosis Date Noted  . Previous cesarean section 05/22/2014  . Gestational hypertension without significant proteinuria, antepartum 05/16/2014  . Gestational hypertension   . H/O cesarean section complicating pregnancy 05/06/2014  . Pregnant 05/06/2014  . 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 HSV-2 (herpes simplex virus 2)  infection 04/30/2014  . Lost custody of children 04/30/2014  . Previous cesarean delivery, antepartum 04/30/2014    Past Surgical History:  Procedure Laterality Date  . CESAREAN SECTION  06/09/12   FTP  . CESAREAN SECTION N/A 05/21/2014   Procedure: CESAREAN SECTION;  Surgeon: Reva Boresanya S Pratt, MD;  Location: [facility];  Service: Obstetrics;  Laterality: N/A;  . CHOLECYSTECTOMY    . WISDOM TOOTH EXTRACTION      OB History    Gravida Para Term Preterm AB Living   2 2 2     2    SAB TAB Ectopic Multiple Live Births         0 2       Home Medications    Prior to Admission medications   Medication Sig Start Date End Date Taking? Authorizing Provider  diphenhydrAMINE (BENADRYL) 25 MG tablet Take 25 mg by mouth every 6 (six) hours as needed for sleep.    Historical Provider, MD  etonogestrel-ethinyl estradiol (NUVARING) 0.12-0.015 MG/24HR vaginal ring Insert vaginally and leave in place for 3 consecutive weeks, then remove for 1 week. 05/28/15   Catalina AntiguaPeggy Constant, MD    Family History History reviewed. No pertinent family history.  Social History Social History  Substance Use Topics  . Smoking status: Former Games developermoker  . Smokeless tobacco: Never Used  . Alcohol use No     Allergies   Review of patient's allergies indicates no known allergies.   Review of Systems Review of Systems  Constitutional: Positive for  chills. Negative for fever.  HENT: Negative for rhinorrhea and sneezing.   Eyes: Negative for itching.  Respiratory: Positive for cough. Negative for shortness of breath.   Skin: Negative for rash.     Physical Exam Updated Vital Signs BP 114/78 (BP Location: Left Arm)   Pulse 84   Temp 98 F (36.7 C) (Oral)   LMP 12/30/2015 (Exact Date)   SpO2 97%   Physical Exam  Constitutional: She appears well-developed and well-nourished. No distress.  HENT:  Head: Normocephalic and atraumatic.  Mouth/Throat: Oropharynx is clear and moist.  Ears and nose normal. Anterior  cervical lymphadenopathy. Trachea midline and no thyromegaly.   Neck: Neck supple.  Cardiovascular: Normal heart sounds.   Pulmonary/Chest: Effort normal and breath sounds normal.  Neurological: She is alert.  Skin: She is not diaphoretic.  Nursing note and vitals reviewed.    ED Treatments / Results  DIAGNOSTIC STUDIES:  Oxygen Saturation is 97% on RA, normal by my interpretation.    COORDINATION OF CARE:  3:03 PM Discussed treatment plan with pt at bedside and pt agreed to plan.  Labs (all labs ordered are listed, but only abnormal results are displayed) Labs Reviewed - No data to display  EKG  EKG Interpretation None       Radiology Dg Chest 2 View  Result Date: 12/30/2015 CLINICAL DATA:  25 y/o  F; 5 days of dry cough. EXAM: CHEST  2 VIEW COMPARISON:  None. FINDINGS: The heart size and mediastinal contours are within normal limits. Both lungs are clear. The visualized skeletal structures are unremarkable. IMPRESSION: No active cardiopulmonary disease. Electronically Signed   By: Mitzi Hansen M.D.   On: 12/30/2015 15:15    Procedures Procedures (including critical care time)  Medications Ordered in ED Medications - No data to display   Initial Impression / Assessment and Plan / ED Course  I have reviewed the triage vital signs and the nursing notes.  Pertinent labs & imaging results that were available during my care of the patient were reviewed by me and considered in my medical decision making (see chart for details).  Clinical Course    BP 114/78 (BP Location: Left Arm)   Pulse 84   Temp 98 F (36.7 C) (Oral)   LMP 12/30/2015 (Exact Date)   SpO2 97%    Final Clinical Impressions(s) / ED Diagnoses   Final diagnoses:  Cough    New Prescriptions Discharge Medication List as of 12/30/2015  3:19 PM    START taking these medications   Details  benzonatate (TESSALON) 100 MG capsule Take 1 capsule (100 mg total) by mouth 3 (three) times  daily as needed for cough., Starting Tue 12/30/2015, Print      I personally performed the services described in this documentation, which was scribed in my presence. The recorded information has been reviewed and is accurate.   Pt presents with non productive cough.  Low risk of PE.  CXR without acute finding.  Pt resting comfortably.  Will treat sxs.     Fayrene Helper, PA-C 12/31/15 1102    Bethann Berkshire, MD 01/02/16 1714

## 2015-12-30 NOTE — ED Triage Notes (Signed)
Pt reports dry cough x 5 days. Temp of 99.4 yesterday. Did not try OTC meds. Lungs clear at present

## 2016-05-27 ENCOUNTER — Telehealth: Payer: Self-pay | Admitting: General Practice

## 2016-05-27 DIAGNOSIS — Z3044 Encounter for surveillance of vaginal ring hormonal contraceptive device: Secondary | ICD-10-CM

## 2016-05-27 MED ORDER — ETONOGESTREL-ETHINYL ESTRADIOL 0.12-0.015 MG/24HR VA RING: VAGINAL_RING | VAGINAL | 2 refills | Status: DC

## 2016-05-27 NOTE — Telephone Encounter (Signed)
Patient called into front office stating she needs a refill of her nuvaring. Patient has annual exam scheduled for 3/1. Sent refills in per protocol. Called & informed patient. Patient verbalized understanding & had no questions

## 2016-06-17 ENCOUNTER — Ambulatory Visit: Payer: Medicaid Other | Admitting: Obstetrics & Gynecology

## 2016-07-23 ENCOUNTER — Other Ambulatory Visit (HOSPITAL_COMMUNITY)
Admission: RE | Admit: 2016-07-23 | Discharge: 2016-07-23 | Disposition: A | Discharge status: Home / Self Care | Payer: Medicaid Other | Source: Ambulatory Visit | Attending: Obstetrics & Gynecology | Admitting: Obstetrics & Gynecology

## 2016-07-23 ENCOUNTER — Ambulatory Visit (INDEPENDENT_AMBULATORY_CARE_PROVIDER_SITE_OTHER): Payer: Medicaid Other | Admitting: Obstetrics & Gynecology

## 2016-07-23 ENCOUNTER — Ambulatory Visit: Payer: Medicaid Other | Admitting: Obstetrics & Gynecology

## 2016-07-23 ENCOUNTER — Encounter: Payer: Self-pay | Admitting: Obstetrics & Gynecology

## 2016-07-23 VITALS — BP 125/85 | HR 68 | Wt 276.6 lb

## 2016-07-23 DIAGNOSIS — Z113 Encounter for screening for infections with a predominantly sexual mode of transmission: Secondary | ICD-10-CM | POA: Insufficient documentation

## 2016-07-23 DIAGNOSIS — Z23 Encounter for immunization: Secondary | ICD-10-CM | POA: Diagnosis not present

## 2016-07-23 DIAGNOSIS — Z01419 Encounter for gynecological examination (general) (routine) without abnormal findings: Secondary | ICD-10-CM | POA: Diagnosis present

## 2016-07-23 DIAGNOSIS — Z1151 Encounter for screening for human papillomavirus (HPV): Secondary | ICD-10-CM | POA: Insufficient documentation

## 2016-07-23 DIAGNOSIS — Z3044 Encounter for surveillance of vaginal ring hormonal contraceptive device: Secondary | ICD-10-CM

## 2016-07-23 DIAGNOSIS — Z0000 Encounter for general adult medical examination without abnormal findings: Secondary | ICD-10-CM | POA: Diagnosis not present

## 2016-07-23 MED ORDER — ETONOGESTREL-ETHINYL ESTRADIOL 0.12-0.015 MG/24HR VA RING: VAGINAL_RING | VAGINAL | 12 refills | Status: DC

## 2016-07-23 MED ORDER — METRONIDAZOLE 500 MG PO TABS: 500.0000 mg | ORAL_TABLET | Freq: Two times a day (BID) | ORAL | 0 refills | Status: DC

## 2016-07-23 MED ORDER — HPV QUADRIVALENT VACCINE IM SUSP: 0.5000 mL | Freq: Once | INTRAMUSCULAR | 0 refills | Status: AC

## 2016-07-23 NOTE — Progress Notes (Signed)
Subjective:    Miranda Robertson is a 26 y.o. engaged W P2 (4 and 2 yo kids)female who presents for an annual exam. The patient has no complaints today. The patient is sexually active. GYN screening history: last pap: was normal. The patient wears seatbelts: yes. The patient participates in regular exercise: yes. Has the patient ever been transfused or tattooed?: not asked. The patient reports that there is not12 domestic violence in her life.   Menstrual History: OB History    Gravida Para Term Preterm AB Living   SAB TAB Ectopic Multiple Live Births         0 2      Menarche 14 No LMP recorded.    The following portions of the patient's history were reviewed and updated as appropriate: allergies, current medications, past family history, past medical history, past social history, past surgical history and problem list.  Review of Systems Pertinent items are noted in HPI.   Denies FH of breast cancer Her 24 yo is adopted   Objective:    BP 125/85   Pulse 68   Wt 276 lb 9.6 oz (125.5 kg)   BMI 44.64 kg/m   General Appearance:    Alert, cooperative, no distress, appears stated age  Head:    Normocephalic, without obvious abnormality, atraumatic  Eyes:    PERRL, conjunctiva/corneas clear, EOM's intact, fundi    benign, both eyes  Ears:    Normal TM's and external ear canals, both ears  Nose:   Nares normal, septum midline, mucosa normal, no drainage    or sinus tenderness  Throat:   Lips, mucosa, and tongue normal; teeth and gums normal  Neck:   Supple, symmetrical, trachea midline, no adenopathy;    thyroid:  no enlargement/tenderness/nodules; no carotid   bruit or JVD  Back:     Symmetric, no curvature, ROM normal, no CVA tenderness  Lungs:     Clear to auscultation bilaterally, respirations unlabored  Chest Wall:    No tenderness or deformity   Heart:    Regular rate and rhythm, S1 and S2 normal, no murmur, rub   or gallop  Breast Exam:    No tenderness, masses,  or nipple abnormality  Abdomen:     Soft, non-tender, bowel sounds active all four quadrants,    no masses, no organomegaly  Genitalia:    Normal female without lesion, discharge or tenderness, nulliparous cervix, no lesion, discharge c/w BV, no adnexal masses or tenderenss     Extremities:   Extremities normal, atraumatic, no cyanosis or edema  Pulses:   2+ and symmetric all extremities  Skin:   Skin color, texture, turgor normal, no rashes or lesions  Lymph nodes:   Cervical, supraclavicular, and axillary nodes normal  Neurologic:   CNII-XII intact, normal strength, sensation and reflexes    throughout  .    Assessment:    Healthy female exam.    Plan:     Thin prep Pap smear. with cervical cultures Labs per her psychiatrist Start Gardasil  RTC 2 months for Gardasil #2 Refill Nuvaring STI testing Fasting labs Flagyl for BV

## 2016-07-23 NOTE — Addendum Note (Signed)
Addended by: Cheree Ditto, DEMETRICE A on: 07/23/2016 11:19 AM   Modules accepted: Orders

## 2016-07-24 LAB — COMPREHENSIVE METABOLIC PANEL
ALK PHOS: 81 IU/L (ref 39–117)
ALT: 17 IU/L (ref 0–32)
AST: 14 IU/L (ref 0–40)
Albumin/Globulin Ratio: 1.2 (ref 1.2–2.2)
Albumin: 3.9 g/dL (ref 3.5–5.5)
BUN / CREAT RATIO: 14 (ref 9–23)
BUN: 10 mg/dL (ref 6–20)
Bilirubin Total: 0.2 mg/dL (ref 0.0–1.2)
CO2: 19 mmol/L (ref 18–29)
CREATININE: 0.73 mg/dL (ref 0.57–1.00)
Calcium: 9.6 mg/dL (ref 8.7–10.2)
Chloride: 106 mmol/L (ref 96–106)
GFR calc Af Amer: 134 mL/min/{1.73_m2} (ref >59)
GFR calc non Af Amer: 116 mL/min/{1.73_m2} (ref >59)
GLOBULIN, TOTAL: 3.2 g/dL (ref 1.5–4.5)
Glucose: 92 mg/dL (ref 65–99)
Potassium: 4.4 mmol/L (ref 3.5–5.2)
SODIUM: 139 mmol/L (ref 134–144)
TOTAL PROTEIN: 7.1 g/dL (ref 6.0–8.5)

## 2016-07-24 LAB — LIPID PANEL
Chol/HDL Ratio: 2.9 ratio (ref 0.0–4.4)
Cholesterol, Total: 157 mg/dL (ref 100–199)
HDL: 55 mg/dL (ref >39)
LDL Calculated: 80 mg/dL (ref 0–99)
Triglycerides: 110 mg/dL (ref 0–149)
VLDL CHOLESTEROL CAL: 22 mg/dL (ref 5–40)

## 2016-07-24 LAB — CBC
Hematocrit: 38.8 % (ref 34.0–46.6)
Hemoglobin: 12.8 g/dL (ref 11.1–15.9)
MCH: 26.1 pg — ABNORMAL LOW (ref 26.6–33.0)
MCHC: 33 g/dL (ref 31.5–35.7)
MCV: 79 fL (ref 79–97)
PLATELETS: 386 10*3/uL — AB (ref 150–379)
RBC: 4.91 x10E6/uL (ref 3.77–5.28)
RDW: 15.1 % (ref 12.3–15.4)
WBC: 9.5 10*3/uL (ref 3.4–10.8)

## 2016-07-24 LAB — HEPATITIS B SURFACE ANTIGEN: Hepatitis B Surface Ag: NEGATIVE

## 2016-07-24 LAB — TSH: TSH: 0.942 u[IU]/mL (ref 0.450–4.500)

## 2016-07-24 LAB — HEMOGLOBIN A1C
Est. average glucose Bld gHb Est-mCnc: 94 mg/dL
HEMOGLOBIN A1C: 4.9 % (ref 4.8–5.6)

## 2016-07-24 LAB — HIV ANTIBODY (ROUTINE TESTING W REFLEX): HIV SCREEN 4TH GENERATION: NONREACTIVE

## 2016-07-24 LAB — RPR: RPR: NONREACTIVE

## 2016-07-24 LAB — HEPATITIS C ANTIBODY

## 2016-07-27 ENCOUNTER — Encounter: Payer: Self-pay | Admitting: Obstetrics & Gynecology

## 2016-07-28 ENCOUNTER — Ambulatory Visit: Payer: Medicaid Other | Admitting: Obstetrics & Gynecology

## 2016-07-29 LAB — CYTOLOGY - PAP
CHLAMYDIA, DNA PROBE: NEGATIVE
Diagnosis: NEGATIVE
HPV: NOT DETECTED
NEISSERIA GONORRHEA: NEGATIVE

## 2016-09-22 ENCOUNTER — Ambulatory Visit: Payer: Medicaid Other

## 2016-09-27 ENCOUNTER — Ambulatory Visit (INDEPENDENT_AMBULATORY_CARE_PROVIDER_SITE_OTHER): Payer: Medicaid Other | Admitting: *Deleted

## 2016-09-27 DIAGNOSIS — Z23 Encounter for immunization: Secondary | ICD-10-CM | POA: Diagnosis not present

## 2016-09-27 DIAGNOSIS — IMO0001 Reserved for inherently not codable concepts without codable children: Secondary | ICD-10-CM

## 2016-11-09 ENCOUNTER — Ambulatory Visit: Payer: Medicaid Other

## 2016-11-11 ENCOUNTER — Ambulatory Visit (INDEPENDENT_AMBULATORY_CARE_PROVIDER_SITE_OTHER): Payer: Medicaid Other | Admitting: General Practice

## 2016-11-11 ENCOUNTER — Other Ambulatory Visit (HOSPITAL_COMMUNITY)
Admission: RE | Admit: 2016-11-11 | Discharge: 2016-11-11 | Disposition: A | Discharge status: Home / Self Care | Payer: Medicaid Other | Source: Ambulatory Visit | Attending: Family Medicine | Admitting: Family Medicine

## 2016-11-11 DIAGNOSIS — N941 Unspecified dyspareunia: Secondary | ICD-10-CM | POA: Diagnosis present

## 2016-11-11 DIAGNOSIS — B373 Candidiasis of vulva and vagina: Secondary | ICD-10-CM | POA: Diagnosis not present

## 2016-11-11 DIAGNOSIS — B9689 Other specified bacterial agents as the cause of diseases classified elsewhere: Secondary | ICD-10-CM | POA: Insufficient documentation

## 2016-11-11 DIAGNOSIS — Z113 Encounter for screening for infections with a predominantly sexual mode of transmission: Secondary | ICD-10-CM | POA: Diagnosis not present

## 2016-11-11 DIAGNOSIS — R300 Dysuria: Secondary | ICD-10-CM | POA: Diagnosis not present

## 2016-11-11 DIAGNOSIS — N898 Other specified noninflammatory disorders of vagina: Secondary | ICD-10-CM

## 2016-11-11 LAB — POCT URINALYSIS DIP (DEVICE)
Bilirubin Urine: NEGATIVE
GLUCOSE, UA: NEGATIVE mg/dL
Hgb urine dipstick: NEGATIVE
Ketones, ur: NEGATIVE mg/dL
NITRITE: NEGATIVE
PH: 6 (ref 5.0–8.0)
PROTEIN: NEGATIVE mg/dL
Specific Gravity, Urine: 1.02 (ref 1.005–1.030)
UROBILINOGEN UA: 0.2 mg/dL (ref 0.0–1.0)

## 2016-11-11 NOTE — Addendum Note (Signed)
Addended by: Kathee DeltonHILLMAN, Quinita Kostelecky L on: 11/11/2016 04:18 PM   Modules accepted: Orders

## 2016-11-11 NOTE — Progress Notes (Signed)
Patient presents today with vaginal irritation, odor, burning with urination & pain with intercourse x 1 week. Instructed patient in leaving a urine sample & self swab. Told patient doubt UTI based off UA results but will send urine for culture which takes a couple days to come back. Told patient we will call her with results or she may call us or look at her mychart. Patient verbalized understanding & had no questions

## 2016-11-12 ENCOUNTER — Other Ambulatory Visit: Payer: Self-pay | Admitting: Family Medicine

## 2016-11-12 LAB — CERVICOVAGINAL ANCILLARY ONLY
BACTERIAL VAGINITIS: NEGATIVE
CANDIDA VAGINITIS: POSITIVE — AB
CHLAMYDIA, DNA PROBE: NEGATIVE
Neisseria Gonorrhea: NEGATIVE
Trichomonas: NEGATIVE

## 2016-11-13 LAB — URINE CULTURE

## 2016-11-15 ENCOUNTER — Other Ambulatory Visit: Payer: Self-pay | Admitting: General Practice

## 2016-11-15 ENCOUNTER — Encounter: Payer: Self-pay | Admitting: General Practice

## 2016-11-15 DIAGNOSIS — B379 Candidiasis, unspecified: Secondary | ICD-10-CM

## 2016-11-15 DIAGNOSIS — R195 Other fecal abnormalities: Secondary | ICD-10-CM

## 2016-11-15 MED ORDER — FLUCONAZOLE 150 MG PO TABS: 150.0000 mg | ORAL_TABLET | Freq: Once | ORAL | 0 refills | Status: AC

## 2016-12-17 ENCOUNTER — Other Ambulatory Visit: Payer: Self-pay | Admitting: Obstetrics and Gynecology

## 2016-12-17 DIAGNOSIS — Z3044 Encounter for surveillance of vaginal ring hormonal contraceptive device: Secondary | ICD-10-CM

## 2017-01-27 ENCOUNTER — Ambulatory Visit: Payer: Medicaid Other

## 2017-02-01 ENCOUNTER — Ambulatory Visit: Payer: Medicaid Other

## 2017-02-05 ENCOUNTER — Emergency Department (HOSPITAL_COMMUNITY)
Admission: EM | Admit: 2017-02-05 | Discharge: 2017-02-05 | Disposition: A | Discharge status: Home / Self Care | Payer: Medicaid Other | Attending: Emergency Medicine | Admitting: Emergency Medicine

## 2017-02-05 ENCOUNTER — Emergency Department (HOSPITAL_COMMUNITY): Payer: Medicaid Other

## 2017-02-05 ENCOUNTER — Encounter (HOSPITAL_COMMUNITY): Payer: Self-pay | Admitting: Emergency Medicine

## 2017-02-05 DIAGNOSIS — J40 Bronchitis, not specified as acute or chronic: Secondary | ICD-10-CM

## 2017-02-05 DIAGNOSIS — R51 Headache: Secondary | ICD-10-CM | POA: Diagnosis present

## 2017-02-05 DIAGNOSIS — J019 Acute sinusitis, unspecified: Secondary | ICD-10-CM | POA: Diagnosis not present

## 2017-02-05 DIAGNOSIS — Z87891 Personal history of nicotine dependence: Secondary | ICD-10-CM | POA: Insufficient documentation

## 2017-02-05 DIAGNOSIS — J209 Acute bronchitis, unspecified: Secondary | ICD-10-CM | POA: Insufficient documentation

## 2017-02-05 DIAGNOSIS — F70 Mild intellectual disabilities: Secondary | ICD-10-CM | POA: Insufficient documentation

## 2017-02-05 DIAGNOSIS — J0100 Acute maxillary sinusitis, unspecified: Secondary | ICD-10-CM

## 2017-02-05 MED ORDER — AZITHROMYCIN 250 MG PO TABS: 250.0000 mg | ORAL_TABLET | Freq: Every day | ORAL | 0 refills | Status: DC

## 2017-02-05 MED ORDER — HYDROCODONE-ACETAMINOPHEN 5-325 MG PO TABS
1.0000 | ORAL_TABLET | Freq: Once | ORAL | Status: AC
  Administered 2017-02-05: 1 via ORAL
  Filled 2017-02-05: qty 1

## 2017-02-05 MED ORDER — BENZONATATE 100 MG PO CAPS: 100.0000 mg | ORAL_CAPSULE | Freq: Three times a day (TID) | ORAL | 0 refills | Status: DC

## 2017-02-05 MED ORDER — IPRATROPIUM-ALBUTEROL 0.5-2.5 (3) MG/3ML IN SOLN
3.0000 mL | Freq: Once | RESPIRATORY_TRACT | Status: AC
  Administered 2017-02-05: 3 mL via RESPIRATORY_TRACT
  Filled 2017-02-05: qty 3

## 2017-02-05 MED ORDER — PSEUDOEPHEDRINE HCL 30 MG PO TABS: 30.0000 mg | ORAL_TABLET | ORAL | 0 refills | Status: DC | PRN

## 2017-02-05 MED ORDER — ALBUTEROL SULFATE HFA 108 (90 BASE) MCG/ACT IN AERS
2.0000 | INHALATION_SPRAY | RESPIRATORY_TRACT | Status: DC | PRN
  Administered 2017-02-05: 2 via RESPIRATORY_TRACT
  Filled 2017-02-05: qty 6.7

## 2017-02-05 MED ORDER — NAPROXEN 500 MG PO TABS: 500.0000 mg | ORAL_TABLET | Freq: Two times a day (BID) | ORAL | 0 refills | Status: DC

## 2017-02-05 NOTE — ED Notes (Signed)
Pt ambulatory and independent at discharge.  Verbalized understanding of discharge instructions 

## 2017-02-05 NOTE — ED Provider Notes (Signed)
[facility]-EMER[facility] DEPT Provider Note   CSN: [id number] Arrival date & time: 02/05/17  1733     History   Chief Complaint Chief Complaint  Patient presents with  . Headache  . URI    HPI Miranda Robertson is a 26 y.o. female who presents to the ED with c/o headache, non productive cough and congestin that started Oct 9th and have gotten worse. She has taken OTC medication without relief. She has felt like she was wheezing but is not short of breath.   HPI  Past Medical History:  Diagnosis Date  . Asthma    Childhood  . HSV (herpes simplex virus) infection   . Mental disorder    bipolar, depression, mild menatl retardation, suicidal thoughts in 2010    Patient Active Problem List   Diagnosis Date Noted  . Previous cesarean section 05/22/2014  . Gestational hypertension without significant proteinuria, antepartum 05/16/2014  . Gestational hypertension   . H/O cesarean section complicating pregnancy 05/06/2014  . Pregnant 05/06/2014  . 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 Lost custody of children 04/30/2014  . Previous cesarean delivery, antepartum 04/30/2014    Past Surgical History:  Procedure Laterality Date  . CESAREAN SECTION  06/09/12   FTP  . CESAREAN SECTION N/A 05/21/2014   Procedure: CESAREAN SECTION;  Surgeon: Reva Bores, MD;  Location: [facility];  Service: Obstetrics;  Laterality: N/A;  . CHOLECYSTECTOMY    . WISDOM TOOTH EXTRACTION      OB History    Gravida Para Term Preterm AB Living   2 2 2     2    SAB TAB Ectopic Multiple Live Births         0 2       Home Medications    Prior to Admission medications   Medication Sig Start Date End Date Taking? Authorizing Provider  azithromycin (ZITHROMAX) 250 MG tablet Take 1 tablet (250 mg total) by mouth daily. Take first 2 tablets together, then 1 every day until finished. 02/05/17   Janne Napoleon, NP    benzonatate (TESSALON) 100 MG capsule Take 1 capsule (100 mg total) by mouth every 8 (eight) hours. 02/05/17   Janne Napoleon, NP  diphenhydrAMINE (BENADRYL) 25 MG tablet Take 25 mg by mouth every 6 (six) hours as needed for sleep.    [provider]  naproxen (NAPROSYN) 500 MG tablet Take 1 tablet (500 mg total) by mouth 2 (two) times daily. 02/05/17   Janne Napoleon, NP  NUVARING 0.12-0.015 MG/24HR vaginal ring INSERT ONE RING VAGINALLY AND LEAVE IN PLACE FOR THREE CONSECUTIVE WEEKS, THEN REMOVE FOR ONE WEEK 12/21/16   Constant, Peggy, MD  pseudoephedrine (SUDAFED) 30 MG tablet Take 1 tablet (30 mg total) by mouth every 4 (four) hours as needed for congestion. 02/05/17   Janne Napoleon, NP    Family History No family history on file.  Social History Social History  Substance Use Topics  . Smoking status: Former Games developer  . Smokeless tobacco: Never Used  . Alcohol use No     Allergies   Patient has no known allergies.   Review of Systems Review of Systems  Constitutional: Negative for chills and fever.  HENT: Positive for congestion and sinus pressure. Negative for trouble swallowing.   Eyes: Negative for discharge and redness.  Respiratory: Positive for cough and wheezing. Negative for shortness of breath.  Gastrointestinal: Negative for abdominal pain, nausea and vomiting.  Genitourinary: Negative for dysuria and frequency.  Musculoskeletal: Positive for myalgias.  Skin: Negative for rash.  Neurological: Negative for syncope and headaches.  Psychiatric/Behavioral: Negative for confusion.     Physical Exam Updated Vital Signs BP 133/69 (BP Location: Right Arm)   Pulse 87   Temp 99.1 F (37.3 C) (Oral)   Resp 18   LMP 01/21/2017   SpO2 100%   Physical Exam  Constitutional: She is oriented to person, place, and time. She appears well-developed and well-nourished. No distress.  HENT:  Head: Normocephalic and atraumatic.  Right Ear: Tympanic membrane normal.   Left Ear: Tympanic membrane normal.  Nose: Mucosal edema present. Right sinus exhibits maxillary sinus tenderness. Left sinus exhibits maxillary sinus tenderness.  Mouth/Throat: Uvula is midline and mucous membranes are normal. Posterior oropharyngeal erythema present.  Eyes: EOM are normal.  Neck: Normal range of motion. Neck supple.  Cardiovascular: Normal rate and regular rhythm.   Pulmonary/Chest: No respiratory distress. She has decreased breath sounds. She has wheezes. She has no rhonchi.  Abdominal: Soft. There is no tenderness.  Musculoskeletal: Normal range of motion.  Lymphadenopathy:    She has no cervical adenopathy.  Neurological: She is alert and oriented to person, place, and time. No cranial nerve deficit.  Skin: Skin is warm and dry.  Psychiatric: She has a normal mood and affect. Her behavior is normal.  Nursing note and vitals reviewed.    ED Treatments / Results  Labs (all labs ordered are listed, but only abnormal results are displayed) Labs Reviewed - No data to display   Radiology Dg Chest 2 View  Result Date: 02/05/2017 CLINICAL DATA:  Cough and congestion for 1 week. EXAM: CHEST  2 VIEW COMPARISON:  December 30, 2015 FINDINGS: The heart size and mediastinal contours are within normal limits. Both lungs are clear. The visualized skeletal structures are unremarkable. IMPRESSION: No active cardiopulmonary disease. Electronically Signed   By: Gerome Samavid  Williams III M.D   On: 02/05/2017 18:14    Procedures Procedures (including critical care time)  Medications Ordered in ED Medications  ipratropium-albuterol (DUONEB) 0.5-2.5 (3) MG/3ML nebulizer solution 3 mL (3 mLs Nebulization Given 02/05/17 2011)  HYDROcodone-acetaminophen (NORCO/VICODIN) 5-325 MG per tablet 1 tablet (1 tablet Oral Given 02/05/17 2056)   Re examined after neb treatment and lungs are clear.  Patient stable for d/c without respiratory distress and O2 SAT 100% on R/A. CXR normal. Will treat  for sinusitis and bronchitis. Return precautions discussed.   Initial Impression / Assessment and Plan / ED Course  I have reviewed the triage vital signs and the nursing notes.  Final Clinical Impressions(s) / ED Diagnoses   Final diagnoses:  Acute maxillary sinusitis, recurrence not specified  Bronchitis    New Prescriptions Discharge Medication List as of 02/05/2017  8:49 PM    START taking these medications   Details  azithromycin (ZITHROMAX) 250 MG tablet Take 1 tablet (250 mg total) by mouth daily. Take first 2 tablets together, then 1 every day until finished., Starting Sat 02/05/2017, Print    naproxen (NAPROSYN) 500 MG tablet Take 1 tablet (500 mg total) by mouth 2 (two) times daily., Starting Sat 02/05/2017, Print    pseudoephedrine (SUDAFED) 30 MG tablet Take 1 tablet (30 mg total) by mouth every 4 (four) hours as needed for congestion., Starting Sat 02/05/2017, Print         [place], [place] Carm[place] M, NP 02/06/17 1304    Alona BeneLong, Joshua  G, MD 02/06/17 1351

## 2017-02-05 NOTE — ED Notes (Signed)
Called respiratory for breathing treatment

## 2017-02-05 NOTE — ED Triage Notes (Signed)
Patient c/o headache, non productive cough and congestion since the 9th.

## 2017-02-05 NOTE — Discharge Instructions (Signed)
The cool mist vaporizer will help your symptoms. Follow up with your doctor. Return here as needed.

## 2017-03-08 ENCOUNTER — Other Ambulatory Visit: Payer: Self-pay | Admitting: Obstetrics and Gynecology

## 2017-03-08 DIAGNOSIS — Z3044 Encounter for surveillance of vaginal ring hormonal contraceptive device: Secondary | ICD-10-CM

## 2017-06-18 ENCOUNTER — Other Ambulatory Visit: Payer: Self-pay | Admitting: Obstetrics and Gynecology

## 2017-06-18 DIAGNOSIS — Z3044 Encounter for surveillance of vaginal ring hormonal contraceptive device: Secondary | ICD-10-CM

## 2017-09-03 ENCOUNTER — Other Ambulatory Visit: Payer: Self-pay | Admitting: Obstetrics and Gynecology

## 2017-09-03 DIAGNOSIS — Z3044 Encounter for surveillance of vaginal ring hormonal contraceptive device: Secondary | ICD-10-CM

## 2017-10-24 ENCOUNTER — Other Ambulatory Visit: Payer: Self-pay | Admitting: Family Medicine

## 2017-10-24 ENCOUNTER — Ambulatory Visit (INDEPENDENT_AMBULATORY_CARE_PROVIDER_SITE_OTHER): Payer: Medicaid Other | Admitting: *Deleted

## 2017-10-24 DIAGNOSIS — Z3202 Encounter for pregnancy test, result negative: Secondary | ICD-10-CM | POA: Diagnosis present

## 2017-10-24 DIAGNOSIS — Z3200 Encounter for pregnancy test, result unknown: Secondary | ICD-10-CM

## 2017-10-24 LAB — POCT PREGNANCY, URINE: Preg Test, Ur: NEGATIVE

## 2017-10-24 NOTE — Progress Notes (Signed)
Chart reviewed - agree with RN documentation.   

## 2017-10-24 NOTE — Progress Notes (Signed)
Here for upt. Has done 3 at home and all negative. LMP around 09/02/17. Having symptoms of pregnancy like being tired, emotional but has not started period and is two wks late. Occ nausea and occ pain bilateral lower abd. "Severe" back pain in lower, mid back. Denies fever, chills, dysuria. Had brown spotting about wk after last period in May but none since. Pt's psychiatrist wants pt to have blood test for pregnancy before pt continues her meds for depression.

## 2017-10-24 NOTE — Addendum Note (Signed)
Addended by: Angelina SheriffBARHAM, Jenna Ardoin R on: 10/24/2017 02:36 PM   Modules accepted: Level of Service

## 2017-10-25 LAB — BETA HCG QUANT (REF LAB)

## 2017-10-26 ENCOUNTER — Encounter: Payer: Self-pay | Admitting: Obstetrics & Gynecology

## 2017-10-31 ENCOUNTER — Encounter: Payer: Self-pay | Admitting: Obstetrics & Gynecology

## 2017-12-09 ENCOUNTER — Ambulatory Visit (INDEPENDENT_AMBULATORY_CARE_PROVIDER_SITE_OTHER): Payer: Medicaid Other | Admitting: Obstetrics & Gynecology

## 2017-12-09 ENCOUNTER — Encounter: Payer: Self-pay | Admitting: Obstetrics & Gynecology

## 2017-12-09 ENCOUNTER — Other Ambulatory Visit (HOSPITAL_COMMUNITY)
Admission: RE | Admit: 2017-12-09 | Discharge: 2017-12-09 | Disposition: A | Discharge status: Home / Self Care | Payer: Medicaid Other | Source: Ambulatory Visit | Attending: Obstetrics & Gynecology | Admitting: Obstetrics & Gynecology

## 2017-12-09 VITALS — BP 114/87 | HR 83 | Ht 66.0 in | Wt 287.0 lb

## 2017-12-09 DIAGNOSIS — Z3202 Encounter for pregnancy test, result negative: Secondary | ICD-10-CM

## 2017-12-09 DIAGNOSIS — Z0000 Encounter for general adult medical examination without abnormal findings: Secondary | ICD-10-CM

## 2017-12-09 DIAGNOSIS — Z01419 Encounter for gynecological examination (general) (routine) without abnormal findings: Secondary | ICD-10-CM

## 2017-12-09 LAB — POCT PREGNANCY, URINE: PREG TEST UR: NEGATIVE

## 2017-12-09 MED ORDER — ETONOGESTREL-ETHINYL ESTRADIOL 0.12-0.015 MG/24HR VA RING: VAGINAL_RING | VAGINAL | 12 refills | Status: DC

## 2017-12-09 NOTE — Progress Notes (Signed)
Patient has elevated phq9, [facility] and has appt there next week on 8/29.

## 2017-12-09 NOTE — Progress Notes (Signed)
Subjective:    Miranda Robertson is a 27 y.o. single P2 (5 and 463 yo kids, has custody of her 25yo)  female who presents for an annual exam. She last used her Nuvaring in 4/19 and hasn't had a period since. Her UPT today is negative. She last had sex about 10 days ago. The patient is sexually active. GYN screening history: last pap: was normal. The patient wears seatbelts: yes. The patient participates in regular exercise: yes. Has the patient ever been transfused or tattooed?: yes. The patient reports that there is not domestic violence in her life.   Menstrual History: OB History    Gravida  2   Para  2   Term  2   Preterm      AB      Living  2     SAB      TAB      Ectopic      Multiple  0   Live Births  2           Menarche age: [age] Patient's last menstrual period was 09/02/2017 (exact date).    The following portions of the patient's history were reviewed and updated as appropriate: allergies, current medications, past family history, past medical history, past social history, past surgical history and problem list.  Review of Systems Pertinent items are noted in HPI.   FH- no breast/gyn/colon cancer Monogamous for about 4 years, lives together On disability for mental health issues Had Gardasil, TDAP utd   Objective:    BP 114/87   Pulse 83   Ht 5\' 6"  (1.676 m)   Wt 287 lb (130.2 kg)   LMP 09/02/2017 (Exact Date)   BMI 46.32 kg/m   General Appearance:    Alert, cooperative, no distress, appears stated age  Head:    Normocephalic, without obvious abnormality, atraumatic  Eyes:    PERRL, conjunctiva/corneas clear, EOM's intact, fundi    benign, both eyes  Ears:    Normal TM's and external ear canals, both ears  Nose:   Nares normal, septum midline, mucosa normal, no drainage    or sinus tenderness  Throat:   Lips, mucosa, and tongue normal; teeth and gums normal  Neck:   Supple, symmetrical, trachea midline, no adenopathy;    thyroid:  no  enlargement/tenderness/nodules; no carotid   bruit or JVD  Back:     Symmetric, no curvature, ROM normal, no CVA tenderness  Lungs:     Clear to auscultation bilaterally, respirations unlabored  Chest Wall:    No tenderness or deformity   Heart:    Regular rate and rhythm, S1 and S2 normal, no murmur, rub   or gallop  Breast Exam:    No tenderness, masses, or nipple abnormality  Abdomen:     Soft, non-tender, bowel sounds active all four quadrants,    no masses, no organomegaly, morbidly obese  Genitalia:    Normal female without lesion, discharge or tenderness, nulliparous cervix, normal size and shape, anteverted, mobile, non-tender, normal adnexal exam      Extremities:   Extremities normal, atraumatic, no cyanosis or edema  Pulses:   2+ and symmetric all extremities  Skin:   Skin color, texture, turgor normal, no rashes or lesions  Lymph nodes:   Cervical, supraclavicular, and axillary nodes normal  Neurologic:   CNII-XII intact, normal strength, sensation and reflexes    throughout  .    Assessment:    Healthy female exam.   Oligomenorrhea,  most likely due to weight (TSH was normal recently)   Plan:     Thin prep Pap smear.   Flu vaccine at next visit UPT next week (no sex until then). If UPT is negative, then she can restart her Nuvaring Rec weight loss for health benefits Fasting labs today and she will follow up with the [facility] clinic

## 2017-12-09 NOTE — Addendum Note (Signed)
Addended by: Kathee DeltonHILLMAN, CARRIE L on: 12/09/2017 05:09 PM   Modules accepted: Orders

## 2017-12-10 LAB — LIPID PANEL
Chol/HDL Ratio: 3.5 ratio (ref 0.0–4.4)
Cholesterol, Total: 187 mg/dL (ref 100–199)
HDL: 53 mg/dL (ref >39)
LDL CALC: 94 mg/dL (ref 0–99)
TRIGLYCERIDES: 201 mg/dL — AB (ref 0–149)
VLDL CHOLESTEROL CAL: 40 mg/dL (ref 5–40)

## 2017-12-10 LAB — CBC
HEMATOCRIT: 40.5 % (ref 34.0–46.6)
HEMOGLOBIN: 13.7 g/dL (ref 11.1–15.9)
MCH: 27 pg (ref 26.6–33.0)
MCHC: 33.8 g/dL (ref 31.5–35.7)
MCV: 80 fL (ref 79–97)
Platelets: 355 10*3/uL (ref 150–450)
RBC: 5.07 x10E6/uL (ref 3.77–5.28)
RDW: 14.5 % (ref 12.3–15.4)
WBC: 9.7 10*3/uL (ref 3.4–10.8)

## 2017-12-10 LAB — TSH: TSH: 1.77 u[IU]/mL (ref 0.450–4.500)

## 2017-12-10 LAB — COMPREHENSIVE METABOLIC PANEL
ALBUMIN: 4.3 g/dL (ref 3.5–5.5)
ALK PHOS: 112 IU/L (ref 39–117)
ALT: 36 IU/L — ABNORMAL HIGH (ref 0–32)
AST: 31 IU/L (ref 0–40)
Albumin/Globulin Ratio: 1.4 (ref 1.2–2.2)
BUN / CREAT RATIO: 11 (ref 9–23)
BUN: 8 mg/dL (ref 6–20)
Bilirubin Total: 0.3 mg/dL (ref 0.0–1.2)
CO2: 23 mmol/L (ref 20–29)
CREATININE: 0.7 mg/dL (ref 0.57–1.00)
Calcium: 9.5 mg/dL (ref 8.7–10.2)
Chloride: 101 mmol/L (ref 96–106)
GFR calc Af Amer: 140 mL/min/{1.73_m2} (ref >59)
GFR, EST NON AFRICAN AMERICAN: 122 mL/min/{1.73_m2} (ref >59)
GLOBULIN, TOTAL: 3 g/dL (ref 1.5–4.5)
Glucose: 95 mg/dL (ref 65–99)
Potassium: 4.3 mmol/L (ref 3.5–5.2)
SODIUM: 137 mmol/L (ref 134–144)
Total Protein: 7.3 g/dL (ref 6.0–8.5)

## 2017-12-10 LAB — HEMOGLOBIN A1C
Est. average glucose Bld gHb Est-mCnc: 103 mg/dL
Hgb A1c MFr Bld: 5.2 % (ref 4.8–5.6)

## 2017-12-13 LAB — CYTOLOGY - PAP
Chlamydia: NEGATIVE
DIAGNOSIS: NEGATIVE
Neisseria Gonorrhea: NEGATIVE

## 2017-12-15 ENCOUNTER — Ambulatory Visit (INDEPENDENT_AMBULATORY_CARE_PROVIDER_SITE_OTHER): Payer: Medicaid Other | Admitting: General Practice

## 2017-12-15 DIAGNOSIS — Z3202 Encounter for pregnancy test, result negative: Secondary | ICD-10-CM | POA: Diagnosis present

## 2017-12-15 LAB — POCT PREGNANCY, URINE: PREG TEST UR: NEGATIVE

## 2017-12-15 NOTE — Progress Notes (Signed)
Patient presents to office today for UPT to begin birth control. UPT -. Patient will begin Nuvaring Rx on Sunday. Flu shot not available in office today- recommended she call in a few weeks for an appt. Patient verbalized understanding & had no questions.

## 2017-12-16 NOTE — Progress Notes (Signed)
I have reviewed this chart and agree with the RN/CMA assessment and management.    Ayerim Berquist C Janisha Bueso, MD, FACOG Attending Physician, [facility]  

## 2019-01-04 ENCOUNTER — Telehealth: Payer: Self-pay | Admitting: *Deleted

## 2019-01-04 DIAGNOSIS — Z01419 Encounter for gynecological examination (general) (routine) without abnormal findings: Secondary | ICD-10-CM

## 2019-01-04 MED ORDER — ETONOGESTREL-ETHINYL ESTRADIOL 0.12-0.015 MG/24HR VA RING: VAGINAL_RING | VAGINAL | 2 refills | Status: DC

## 2019-01-04 NOTE — Telephone Encounter (Signed)
Received a call from [facility] for refill Nuvaring. Per protocol has been seen in last year approved 3 months supply and will have to be seen before she can further refills. Lamoyne Palencia,RN

## 2019-02-23 ENCOUNTER — Other Ambulatory Visit: Payer: Self-pay | Admitting: Lactation Services

## 2019-02-23 MED ORDER — FLUCONAZOLE 150 MG PO TABS: 150.0000 mg | ORAL_TABLET | Freq: Once | ORAL | 0 refills | Status: AC

## 2019-02-23 MED ORDER — VALACYCLOVIR HCL 1 G PO TABS: 1000.0000 mg | ORAL_TABLET | Freq: Two times a day (BID) | ORAL | 4 refills | Status: DC

## 2019-03-19 ENCOUNTER — Other Ambulatory Visit: Payer: Self-pay | Admitting: *Deleted

## 2019-03-19 ENCOUNTER — Encounter: Payer: Self-pay | Admitting: *Deleted

## 2019-03-19 DIAGNOSIS — Z01419 Encounter for gynecological examination (general) (routine) without abnormal findings: Secondary | ICD-10-CM

## 2019-03-19 MED ORDER — ETONOGESTREL-ETHINYL ESTRADIOL 0.12-0.015 MG/24HR VA RING: VAGINAL_RING | VAGINAL | 0 refills | Status: DC

## 2019-03-19 NOTE — Progress Notes (Signed)
Fax refill request received from [facility] for [employer]. Refill for one month sent to pharmacy. Pt notified via [place].

## 2019-03-28 ENCOUNTER — Ambulatory Visit: Payer: Medicaid Other | Admitting: Obstetrics & Gynecology

## 2019-04-05 ENCOUNTER — Other Ambulatory Visit: Payer: Self-pay | Admitting: Lactation Services

## 2019-04-05 ENCOUNTER — Other Ambulatory Visit: Payer: Self-pay

## 2019-04-05 ENCOUNTER — Other Ambulatory Visit: Payer: Self-pay | Admitting: *Deleted

## 2019-04-05 DIAGNOSIS — Z01419 Encounter for gynecological examination (general) (routine) without abnormal findings: Secondary | ICD-10-CM

## 2019-04-05 MED ORDER — ETONOGESTREL-ETHINYL ESTRADIOL 0.12-0.015 MG/24HR VA RING: VAGINAL_RING | VAGINAL | 0 refills | Status: DC

## 2019-04-05 NOTE — Telephone Encounter (Signed)
Received fax for refill of Ethinyl Estradiol supp. Will route to provider.  Linda,RN

## 2019-04-05 NOTE — Progress Notes (Signed)
Pt has Gyn appt scheduled for 1/27. Nuvaring renewed x 1 month until Gyn appt.

## 2019-04-06 MED ORDER — ETONOGESTREL-ETHINYL ESTRADIOL 0.12-0.015 MG/24HR VA RING: VAGINAL_RING | VAGINAL | 0 refills | Status: DC

## 2019-05-03 ENCOUNTER — Encounter (HOSPITAL_COMMUNITY): Payer: Self-pay | Admitting: Emergency Medicine

## 2019-05-03 ENCOUNTER — Emergency Department (HOSPITAL_COMMUNITY)
Admission: EM | Admit: 2019-05-03 | Discharge: 2019-05-03 | Disposition: A | Discharge status: Home / Self Care | Payer: Medicaid Other | Attending: Emergency Medicine | Admitting: Emergency Medicine

## 2019-05-03 ENCOUNTER — Other Ambulatory Visit: Payer: Self-pay

## 2019-05-03 DIAGNOSIS — R45851 Suicidal ideations: Secondary | ICD-10-CM | POA: Insufficient documentation

## 2019-05-03 DIAGNOSIS — J45909 Unspecified asthma, uncomplicated: Secondary | ICD-10-CM | POA: Diagnosis not present

## 2019-05-03 DIAGNOSIS — F17210 Nicotine dependence, cigarettes, uncomplicated: Secondary | ICD-10-CM | POA: Diagnosis not present

## 2019-05-03 DIAGNOSIS — Z79899 Other long term (current) drug therapy: Secondary | ICD-10-CM | POA: Diagnosis not present

## 2019-05-03 DIAGNOSIS — F329 Major depressive disorder, single episode, unspecified: Secondary | ICD-10-CM | POA: Insufficient documentation

## 2019-05-03 DIAGNOSIS — F419 Anxiety disorder, unspecified: Secondary | ICD-10-CM | POA: Insufficient documentation

## 2019-05-03 MED ORDER — TRAZODONE HCL 50 MG PO TABS: 50.0000 mg | ORAL_TABLET | Freq: Every day | ORAL | Status: DC

## 2019-05-03 MED ORDER — LURASIDONE HCL 20 MG PO TABS
60.0000 mg | ORAL_TABLET | Freq: Every day | ORAL | Status: DC
  Filled 2019-05-03: qty 1

## 2019-05-03 MED ORDER — DIPHENHYDRAMINE HCL 25 MG PO CAPS: 25.0000 mg | ORAL_CAPSULE | Freq: Four times a day (QID) | ORAL | Status: DC | PRN

## 2019-05-03 NOTE — BH Assessment (Signed)
18:03 Called [facility] to arrange TTS with pt & was advised pt was discharged

## 2019-05-03 NOTE — ED Provider Notes (Addendum)
[facility] DEPT Provider Note   CSN: [id number] Arrival date & time: 05/03/19  1610     History Chief Complaint  Patient presents with  . Suicidal    Miranda Robertson is a 29 y.o. female.  HPI    Patient presents with ongoing suicidal ideation, anxiousness. Patient denies any physical complaints. She notes that she has a long history of psychiatric disease, has been on multiple medication, and recently began taking phentermine. She notes that she has recently been particularly anxious, has had persistent suicidal thoughts, though without any plan to execute this.  She notes that she has a son with whom she has concerned, and those concerns prevent her from completing suicide. Today she spoke with her psychiatrist, and seemingly with concern of her medication regimen, persistent suicidal ideation, anxiousness she was sent here for evaluation.   Past Medical History:  Diagnosis Date  . Asthma    Childhood  . HSV (herpes simplex virus) infection   . Mental disorder    bipolar, depression, mild menatl retardation, suicidal thoughts in 2010    Patient Active Problem List   Diagnosis Date Noted  . Previous cesarean section 05/22/2014  . Gestational hypertension without significant proteinuria, antepartum 05/16/2014  . Gestational hypertension   . H/O cesarean section complicating pregnancy 92/33/0076  . Pregnant 05/06/2014  . 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 Lost custody of children 04/30/2014  . Previous cesarean delivery, antepartum 04/30/2014    Past Surgical History:  Procedure Laterality Date  . CESAREAN SECTION  06/09/12   FTP  . CESAREAN SECTION N/A 05/21/2014   Procedure: CESAREAN SECTION;  Surgeon: Donnamae Jude, MD;  Location: [facility];  Service: Obstetrics;  Laterality: N/A;  . CHOLECYSTECTOMY    . WISDOM TOOTH EXTRACTION       OB History    Gravida   2   Para  2   Term  2   Preterm      AB      Living  2     SAB      TAB      Ectopic      Multiple  0   Live Births  2           No family history on file.  Social History   Tobacco Use  . Smoking status: Current Every Day Smoker    Types: Cigarettes  . Smokeless tobacco: Never Used  Substance Use Topics  . Alcohol use: Yes  . Drug use: Yes    Types: Marijuana    Home Medications Prior to Admission medications   Medication Sig Start Date End Date Taking? Authorizing Provider  diphenhydrAMINE (BENADRYL) 25 MG tablet Take 25 mg by mouth every 6 (six) hours as needed for sleep.    [provider]  etonogestrel-ethinyl estradiol (NUVARING) 0.12-0.015 MG/24HR vaginal ring Insert vaginally and leave in place for 3 consecutive weeks, then remove for 1 week. 04/05/19   Woodroe Mode, MD  etonogestrel-ethinyl estradiol (NUVARING) 0.12-0.015 MG/24HR vaginal ring Insert vaginally and leave in place for 3 consecutive weeks, then remove for 1 week. 04/06/19   Emily Filbert, MD  Lurasidone HCl (LATUDA) 60 MG TABS Take 60 mg by mouth at bedtime.    [provider]  traZODone (DESYREL) 100 MG tablet Take 50 mg by mouth at bedtime.     [provider]  valACYclovir (VALTREX) 1000  MG tablet Take 1 tablet (1,000 mg total) by mouth 2 (two) times daily. 02/23/19   Tereso Newcomer, MD    Allergies    Patient has no known allergies.  Review of Systems   Review of Systems  Constitutional:       Per HPI, otherwise negative  HENT:       Per HPI, otherwise negative  Respiratory:       Per HPI, otherwise negative  Cardiovascular:       Per HPI, otherwise negative  Gastrointestinal: Negative for vomiting.  Endocrine:       Negative aside from HPI  Genitourinary:       Neg aside from HPI   Musculoskeletal:       Per HPI, otherwise negative  Skin: Negative.   Neurological: Negative for syncope.  Psychiatric/Behavioral: Positive for dysphoric  mood, sleep disturbance and suicidal ideas. The patient is nervous/anxious.     Physical Exam Updated Vital Signs BP (!) 147/102 (BP Location: Left Arm)   Pulse 97   Temp 99 F (37.2 C) (Oral)   Resp 18   LMP 05/05/2018   SpO2 99%   Physical Exam Vitals and nursing note reviewed.  Constitutional:      General: She is not in acute distress.    Appearance: She is well-developed.  HENT:     Head: Normocephalic and atraumatic.  Eyes:     Conjunctiva/sclera: Conjunctivae normal.  Pulmonary:     Effort: Pulmonary effort is normal. No respiratory distress.     Breath sounds: No stridor.  Abdominal:     General: There is no distension.  Skin:    General: Skin is warm and dry.  Neurological:     Mental Status: She is alert and oriented to person, place, and time.     Cranial Nerves: No cranial nerve deficit.  Psychiatric:        Mood and Affect: Mood is anxious.        Thought Content: Thought content is not paranoid or delusional. Thought content does not include suicidal ideation.     ED Results / Procedures / Treatments   Labs (all labs ordered are listed, but only abnormal results are displayed) Labs Reviewed - No data to display   Procedures Procedures (including critical care time)  Medications Ordered in ED Medications - No data to display  ED Course  I have reviewed the triage vital signs and the nursing notes.  Pertinent labs & imaging results that were available during my care of the patient were reviewed by me and considered in my medical decision making (see chart for details).    MDM Rules/Calculators/A&P                     Female psychiatric disease presents with ongoing suicidal thoughts, anxiousness.  Patient is awake, alert, has some insight into her presentation, though she acknowledges suicidal thoughts she also states that she has no intention of executing her plan, she is concerned about her son and his wellbeing. Absent physical complaints,  hemodynamic instability, the patient is medically cleared for psychiatry evaluation.  5:09 PM Patient requests discharge. She is not here under involuntary commitment.  She has insight into her presentation, explicitly states that she will not kill herself, notes that her thoughts are more rapid than usual, and this is one of the primary concerns.  She notes that she does have a psychiatrist with whom she can follow-up. Patient's discharge requeast accommodated. Final  Clinical Impression(s) / ED Diagnoses Final diagnoses:  Suicidal thoughts     Gerhard Munch, MD 05/03/19 1704    Gerhard Munch, MD 05/03/19 1710

## 2019-05-03 NOTE — ED Triage Notes (Signed)
Pt reports that having suicidal thoughts for the past couple weeks without a specific plan. Reports that she having issues with her fiance and what causing her thoughts.

## 2019-05-03 NOTE — Discharge Instructions (Signed)
Please be sure to call your psychiatrist tomorrow, and discuss today's evaluation, and your departure prior to completing evaluation.  If you develop new, or concerning changes, do not hesitate to return.

## 2019-05-16 ENCOUNTER — Encounter: Payer: Self-pay | Admitting: Obstetrics & Gynecology

## 2019-05-16 ENCOUNTER — Other Ambulatory Visit: Payer: Self-pay

## 2019-05-16 ENCOUNTER — Ambulatory Visit (INDEPENDENT_AMBULATORY_CARE_PROVIDER_SITE_OTHER): Payer: Medicaid Other | Admitting: Obstetrics & Gynecology

## 2019-05-16 ENCOUNTER — Ambulatory Visit: Payer: Medicaid Other | Admitting: Obstetrics & Gynecology

## 2019-05-16 VITALS — BP 127/76 | HR 88 | Wt 276.0 lb

## 2019-05-16 DIAGNOSIS — Z01419 Encounter for gynecological examination (general) (routine) without abnormal findings: Secondary | ICD-10-CM

## 2019-05-16 DIAGNOSIS — Z0000 Encounter for general adult medical examination without abnormal findings: Secondary | ICD-10-CM

## 2019-05-16 NOTE — Progress Notes (Signed)
Normal pap 2018 and 2019 Need refill on Nuva Ring works well for her  Slight cramping on left side started after cycle. States was supposed to put nuva ring back in on Sunday but cycle lasted till Tuesday. Wants a upt before getting nuva ring again. Had unprotected sex last week.

## 2019-05-16 NOTE — Progress Notes (Signed)
Subjective:    Miranda Robertson is a 29 y.o. P2 (21 and 64 yo kids- has custody of the 6 yo son) who presents for an annual exam. The patient has no complaints today except for some cramping in her left groin area, started last at the end of her period last week. She has not tried IBU or tylenol.  The patient is sexually active. She ran out of her Nuvaring and has had unprotected sex as recently as yesterday.  GYN screening history: last pap: was normal. The patient wears seatbelts: yes. The patient participates in regular exercise: yes. (walking) Has the patient ever been transfused or tattooed?: yes. The patient reports that there is not domestic violence in her life.   Menstrual History: OB History    Gravida  2   Para  2   Term  2   Preterm      AB      Living  2     SAB      TAB      Ectopic      Multiple  0   Live Births  2           Menarche age: 89 No LMP recorded.    The following portions of the patient's history were reviewed and updated as appropriate: allergies, current medications, past family history, past medical history, past social history, past surgical history and problem list.  Review of Systems Pertinent items are noted in HPI.   Monogamous with her fiance for 6 years On SSI for her metal diagnoses She declines a flu vaccine.   Objective:    BP 127/76   Pulse 88   Wt 276 lb (125.2 kg)   BMI 44.55 kg/m   General Appearance:    Alert, cooperative, no distress, appears stated age  Head:    Normocephalic, without obvious abnormality, atraumatic  Eyes:    PERRL, conjunctiva/corneas clear, EOM's intact, fundi    benign, both eyes  Ears:    Normal TM's and external ear canals, both ears  Nose:   Nares normal, septum midline, mucosa normal, no drainage    or sinus tenderness  Throat:   Lips, mucosa, and tongue normal; teeth and gums normal  Neck:   Supple, symmetrical, trachea midline, no adenopathy;    thyroid:  no enlargement/tenderness/nodules;  no carotid   bruit or JVD  Back:     Symmetric, no curvature, ROM normal, no CVA tenderness  Lungs:     Clear to auscultation bilaterally, respirations unlabored  Chest Wall:    No tenderness or deformity   Heart:    Regular rate and rhythm, S1 and S2 normal, no murmur, rub   or gallop  Breast Exam:    No tenderness, masses, or nipple abnormality  Abdomen:     Soft, non-tender, bowel sounds active all four quadrants,    no masses, no organomegaly  Genitalia:    Normal female without lesion, discharge or tenderness     Extremities:   Extremities normal, atraumatic, no cyanosis or edema  Pulses:   2+ and symmetric all extremities  Skin:   Skin color, texture, turgor normal, no rashes or lesions  Lymph nodes:   Cervical, supraclavicular, and axillary nodes normal  Neurologic:   CNII-XII intact, normal strength, sensation and reflexes    throughout  .    Assessment:    Healthy female exam.    Plan:     Thin prep Pap smear. next year Rec MVI  daily Rec condoms until she starts her NMP at which time she can put in the Nuvaring and use back up

## 2019-05-22 ENCOUNTER — Ambulatory Visit
Admission: RE | Admit: 2019-05-22 | Discharge: 2019-05-22 | Disposition: A | Discharge status: Home / Self Care | Payer: Medicaid Other | Source: Ambulatory Visit | Attending: Family Medicine | Admitting: Family Medicine

## 2019-05-22 ENCOUNTER — Other Ambulatory Visit: Payer: Self-pay | Admitting: Family Medicine

## 2019-05-22 DIAGNOSIS — R0789 Other chest pain: Secondary | ICD-10-CM

## 2019-06-05 ENCOUNTER — Other Ambulatory Visit: Payer: Self-pay

## 2019-06-05 MED ORDER — ETONOGESTREL-ETHINYL ESTRADIOL 0.12-0.015 MG/24HR VA RING: VAGINAL_RING | VAGINAL | 0 refills | Status: DC

## 2019-07-10 ENCOUNTER — Other Ambulatory Visit: Payer: Self-pay | Admitting: Obstetrics & Gynecology

## 2019-09-26 ENCOUNTER — Other Ambulatory Visit: Payer: Self-pay

## 2019-09-26 DIAGNOSIS — Z01419 Encounter for gynecological examination (general) (routine) without abnormal findings: Secondary | ICD-10-CM

## 2019-09-26 MED ORDER — ETONOGESTREL-ETHINYL ESTRADIOL 0.12-0.015 MG/24HR VA RING: VAGINAL_RING | VAGINAL | 3 refills | Status: DC

## 2019-11-27 ENCOUNTER — Encounter (HOSPITAL_COMMUNITY): Payer: Self-pay | Admitting: Psychiatry

## 2019-11-27 ENCOUNTER — Ambulatory Visit (INDEPENDENT_AMBULATORY_CARE_PROVIDER_SITE_OTHER): Payer: Medicaid Other | Admitting: Psychiatry

## 2019-11-27 ENCOUNTER — Other Ambulatory Visit: Payer: Self-pay

## 2019-11-27 DIAGNOSIS — F319 Bipolar disorder, unspecified: Secondary | ICD-10-CM | POA: Insufficient documentation

## 2019-11-27 DIAGNOSIS — F900 Attention-deficit hyperactivity disorder, predominantly inattentive type: Secondary | ICD-10-CM | POA: Diagnosis not present

## 2019-11-27 MED ORDER — LATUDA 60 MG PO TABS: 60.0000 mg | ORAL_TABLET | Freq: Every day | ORAL | 2 refills | Status: DC

## 2019-11-27 MED ORDER — LITHIUM CARBONATE 300 MG PO TABS: 300.0000 mg | ORAL_TABLET | Freq: Every day | ORAL | 2 refills | Status: DC

## 2019-11-27 MED ORDER — ATOMOXETINE HCL 40 MG PO CAPS: 40.0000 mg | ORAL_CAPSULE | Freq: Every day | ORAL | 2 refills | Status: DC

## 2019-11-27 MED ORDER — LAMOTRIGINE 100 MG PO TABS: 100.0000 mg | ORAL_TABLET | Freq: Two times a day (BID) | ORAL | 2 refills | Status: DC

## 2019-11-27 MED ORDER — MELATONIN 1 MG PO CAPS: 1.0000 mg | ORAL_CAPSULE | Freq: Every evening | ORAL | 1 refills | Status: DC
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
## 2020-01-01 ENCOUNTER — Other Ambulatory Visit (HOSPITAL_COMMUNITY): Payer: Self-pay | Admitting: Psychiatry

## 2020-01-01 DIAGNOSIS — F319 Bipolar disorder, unspecified: Secondary | ICD-10-CM

## 2020-01-09 ENCOUNTER — Other Ambulatory Visit (HOSPITAL_COMMUNITY): Payer: Self-pay | Admitting: Psychiatry

## 2020-01-09 DIAGNOSIS — F900 Attention-deficit hyperactivity disorder, predominantly inattentive type: Secondary | ICD-10-CM

## 2020-01-29 ENCOUNTER — Other Ambulatory Visit: Payer: Self-pay | Admitting: Certified Nurse Midwife

## 2020-01-29 ENCOUNTER — Other Ambulatory Visit (HOSPITAL_COMMUNITY): Payer: Self-pay | Admitting: Psychiatry

## 2020-01-29 DIAGNOSIS — Z01419 Encounter for gynecological examination (general) (routine) without abnormal findings: Secondary | ICD-10-CM

## 2020-01-29 DIAGNOSIS — F319 Bipolar disorder, unspecified: Secondary | ICD-10-CM

## 2020-02-19 ENCOUNTER — Telehealth: Payer: Self-pay | Admitting: Family Medicine

## 2020-02-19 MED ORDER — ETONOGESTREL-ETHINYL ESTRADIOL 0.12-0.015 MG/24HR VA RING: VAGINAL_RING | VAGINAL | 12 refills | Status: DC

## 2020-02-19 NOTE — Telephone Encounter (Signed)
Patient requesting a refill on her birth control

## 2020-02-19 NOTE — Telephone Encounter (Signed)
Patient requesting a refill on Nuvaring, reordered. Advised patient that she needs an annual exam in January to continue her Birth control. Advised that she may or may not need a pap smear at that time. Patient to call the front office to schedule.

## 2020-02-21 ENCOUNTER — Ambulatory Visit (INDEPENDENT_AMBULATORY_CARE_PROVIDER_SITE_OTHER): Payer: Medicaid Other | Admitting: Psychiatry

## 2020-02-21 ENCOUNTER — Encounter (HOSPITAL_COMMUNITY): Payer: Self-pay | Admitting: Psychiatry

## 2020-02-21 ENCOUNTER — Other Ambulatory Visit: Payer: Self-pay

## 2020-02-21 VITALS — BP 131/86 | HR 88 | Temp 97.9°F | Ht 66.0 in | Wt 267.0 lb

## 2020-02-21 DIAGNOSIS — F319 Bipolar disorder, unspecified: Secondary | ICD-10-CM | POA: Diagnosis not present

## 2020-02-21 DIAGNOSIS — F900 Attention-deficit hyperactivity disorder, predominantly inattentive type: Secondary | ICD-10-CM

## 2020-02-21 MED ORDER — LISDEXAMFETAMINE DIMESYLATE 20 MG PO CAPS: 20.0000 mg | ORAL_CAPSULE | ORAL | 0 refills | Status: DC

## 2020-02-21 MED ORDER — LATUDA 60 MG PO TABS: 60.0000 mg | ORAL_TABLET | Freq: Every day | ORAL | 2 refills | Status: DC

## 2020-02-21 MED ORDER — LITHIUM CARBONATE 300 MG PO TABS: 300.0000 mg | ORAL_TABLET | Freq: Every day | ORAL | 2 refills | Status: DC

## 2020-02-21 MED ORDER — LAMOTRIGINE 25 MG PO TABS: 25.0000 mg | ORAL_TABLET | Freq: Every day | ORAL | 1 refills | Status: DC

## 2020-02-21 MED ORDER — LAMOTRIGINE 100 MG PO TABS: 100.0000 mg | ORAL_TABLET | Freq: Every evening | ORAL | 2 refills | Status: DC

## 2020-02-21 MED ORDER — MELATONIN 1 MG PO TABS: 1.0000 mg | ORAL_TABLET | Freq: Every day | ORAL | 2 refills | Status: DC

## 2020-02-21 NOTE — Progress Notes (Signed)
[facility] MD/PA/NP OP Progress Note  02/21/2020 10:50 AM Miranda Robertson  MRN:  [id number]  Chief Complaint: "I stopped strattera and my morning Lamictal because I got sick" Chief Complaint    Medication Management     HPI: 29 year old female seen today for follow-up psychiatric evaluation.  She has a psychiatric history of Bipolar disorder 1, PTSD and depression.  She is currently managed on Strattera 40 mg daily, Lamictal 200 mg twice daily, lithium 300 mg nightly, and Latuda 60 mg nightly. She noted her medications are somewhat effective in managing her psychiatric condition however she notes that she discontinued her morning Lamictal and Strattera because it caused GI upset.  She informed provider that she was unsure if it was the Lamictal that caused her GI upset or Strattera so decided to discontinue both.  Today she is well-groomed, pleasant, cooperative, engaged in conversation, and maintained eye contact.  She describes her mood as irritable and angry.  She notes that since discontinuing the Lamictal she has been more depressed and anxious.  A GAD-7 was conducted and patient scored a 16.  A PHQ 9 was also conducted and patient scored a 16.  Patient notes to cope with her anxiety and depression she has been drinking more.  She notes that she takes about 5 shots on the weekend.  She notes that since her birthday on [birth year] her alcohol consumption has increased.  An AUDIT screening was done and patient scored a 15.  She also notes that she copes by smoking marijuana daily.  Writer informed patient that alcohol and marijuana use can exacerbate irritability, anger, depression, and anxiety.  She endorsed understanding and noted that she would try to reduce her consumption.  She denied SI/HI/VH or paranoia.  She also denied impulsive behaviors.  Patient informed provider that she is excited because she will be able to see her 84-year-old daughter for the first time in 2 years.  She notes that she wants to be  active in her life even though she has been adopted by another family.  She notes that her 1-year-old son is excited to meet his sister.  She informed Clinical research associate  that she is afraid that her daughter will ask why she was given up.  Provider encouraged patient to be honest and tell her daughter that she felt it was safest that she be cared for by another family and that she loved her wanted her to have the best.  She is agreeable to restarting morning does of Lamictal. She will begin by taking Lamictal 25 mg for two weeks and then increasing to 50 mg the next two weeks. Provider encouraged patient to have her lithium levels redrawn. She noted that she would. She will also start Vyvase 20 mg daily to help manage concentration. Potential side effects of medication and risks vs benefits of treatment vs non-treatment were explained and discussed. All questions were answered. She will follow up with outpatient counseling for therapy. No other concerns noted at this time.      Visit Diagnosis:    ICD-10-CM   1. Attention deficit hyperactivity disorder (ADHD), predominantly inattentive type  F90.0 lisdexamfetamine (VYVANSE) 20 MG capsule  2. Bipolar I disorder (HCC)  F31.9 lamoTRIgine (LAMICTAL) 100 MG tablet    lithium 300 MG tablet    Lurasidone HCl (LATUDA) 60 MG TABS    melatonin 1 MG TABS tablet    lamoTRIgine (LAMICTAL) 25 MG tablet    Past Psychiatric History: Bipolar disorder 1, PTSD and depression  Past Medical History:  Past Medical History:  Diagnosis Date  . Asthma    Childhood  . HSV (herpes simplex virus) infection   . Mental disorder    bipolar, depression, mild menatl retardation, suicidal thoughts in 2010    Past Surgical History:  Procedure Laterality Date  . CESAREAN SECTION  06/09/12   FTP  . CESAREAN SECTION N/A 05/21/2014   Procedure: CESAREAN SECTION;  Surgeon: Reva Bores, MD;  Location: [facility];  Service: Obstetrics;  Laterality: N/A;  . CHOLECYSTECTOMY    . WISDOM  TOOTH EXTRACTION      Family Psychiatric History: Depression mother, father, paternal grandmother, paternal aunts, paternal consins  Family History: No family history on file.  Social History:  Social History   Socioeconomic History  . Marital status: Single    Spouse name: Not on file  . Number of children: Not on file  . Years of education: Not on file  . Highest education level: Not on file  Occupational History  . Not on file  Tobacco Use  . Smoking status: Current Every Day Smoker    Types: Cigarettes  . Smokeless tobacco: Never Used  Substance and Sexual Activity  . Alcohol use: Yes  . Drug use: Yes    Types: Marijuana  . Sexual activity: Yes    Birth control/protection: Inserts  Other Topics Concern  . Not on file  Social History Narrative  . Not on file   Social Determinants of Health   Financial Resource Strain:   . Difficulty of Paying Living Expenses: Not on file  Food Insecurity:   . Worried About Programme researcher, broadcasting/film/video in the Last Year: Not on file  . Ran Out of Food in the Last Year: Not on file  Transportation Needs:   . Lack of Transportation (Medical): Not on file  . Lack of Transportation (Non-Medical): Not on file  Physical Activity:   . Days of Exercise per Week: Not on file  . Minutes of Exercise per Session: Not on file  Stress:   . Feeling of Stress : Not on file  Social Connections:   . Frequency of Communication with Friends and Family: Not on file  . Frequency of Social Gatherings with Friends and Family: Not on file  . Attends Religious Services: Not on file  . Active Member of Clubs or Organizations: Not on file  . Attends Banker Meetings: Not on file  . Marital Status: Not on file    Allergies: No Known Allergies  Metabolic Disorder Labs: Lab Results  Component Value Date   HGBA1C 5.2 12/09/2017   MPG 111 01/26/2007   No results found for: PROLACTIN Lab Results  Component Value Date   CHOL 187 12/09/2017    TRIG 201 (H) 12/09/2017   HDL 53 12/09/2017   CHOLHDL 3.5 12/09/2017   VLDL 57 (H) 01/26/2007   LDLCALC 94 12/09/2017   LDLCALC 80 07/23/2016   Lab Results  Component Value Date   TSH 1.770 12/09/2017   TSH 0.942 07/23/2016    Therapeutic Level Labs: No results found for: LITHIUM No results found for: VALPROATE No components found for:  CBMZ  Current Medications: Current Outpatient Medications  Medication Sig Dispense Refill  . diphenhydrAMINE (BENADRYL) 25 MG tablet Take 25 mg by mouth every 6 (six) hours as needed for sleep.    Marland Kitchen etonogestrel-ethinyl estradiol (NUVARING) 0.12-0.015 MG/24HR vaginal ring Insert vaginally and leave in place for 3 consecutive weeks, then remove for 1  week. 1 each 3  . etonogestrel-ethinyl estradiol (NUVARING) 0.12-0.015 MG/24HR vaginal ring Insert vaginally and leave in place for 3 consecutive weeks, then remove for 1 week. 1 each 12  . lamoTRIgine (LAMICTAL) 100 MG tablet Take 1 tablet (100 mg total) by mouth at bedtime. 30 tablet 2  . lamoTRIgine (LAMICTAL) 25 MG tablet Take 1 tablet (25 mg total) by mouth daily. 50 tablet 1  . lisdexamfetamine (VYVANSE) 20 MG capsule Take 1 capsule (20 mg total) by mouth every morning. 30 capsule 0  . lithium 300 MG tablet Take 1 tablet (300 mg total) by mouth daily. 30 tablet 2  . Lurasidone HCl (LATUDA) 60 MG TABS Take 1 tablet (60 mg total) by mouth at bedtime. 30 tablet 2  . melatonin 1 MG TABS tablet Take 1 tablet (1 mg total) by mouth at bedtime. 30 tablet 2  . valACYclovir (VALTREX) 1000 MG tablet TAKE ONE TABLET BY MOUTH TWICE A DAY 10 tablet 3   No current facility-administered medications for this visit.     Musculoskeletal: Strength & Muscle Tone: within normal limits Gait & Station: normal Patient leans: N/A  Psychiatric Specialty Exam: Review of Systems  Blood pressure 131/86, pulse 88, temperature 97.9 F (36.6 C), height 5\' 6"  (1.676 m), weight 267 lb (121.1 kg), SpO2 100 %.Body mass index  is 43.09 kg/m.  General Appearance: Well Groomed  Eye Contact:  Good  Speech:  Clear and Coherent and Normal Rate  Volume:  Normal  Mood:  Anxious, Depressed and Irritable  Affect:  Appropriate and Congruent  Thought Process:  Coherent, Goal Directed and Linear  Orientation:  Full (Time, Place, and Person)  Thought Content: WDL and Logical   Suicidal Thoughts:  No  Homicidal Thoughts:  No  Memory:  Immediate;   Fair Recent;   Fair Remote;   Fair  Judgement:  Fair  Insight:  Fair  Psychomotor Activity:  Normal  Concentration:  Concentration: Fair and Attention Span: Fair  Recall:  Good  Fund of Knowledge: Good  Language: Good  Akathisia:  No  Handed:  Right  AIMS (if indicated):Not done  Assets:  Communication Skills Desire for Improvement Financial Resources/Insurance Housing Intimacy Social Support  ADL's:  Intact  Cognition: WNL  Sleep:  Good   Screenings: AUDIT     [facility]  Alcohol Use Disorder Identification Test Final Score (AUDIT) 15    GAD-7     [facility]  Total GAD-7 Score 16 7 11     PHQ2-9     [facility]  PHQ-2 Total Score 4 1 1   PHQ-9 Total Score 16 10 8        Assessment and Plan: Patient notes that she discontinue Strattera and Lamictal due to GI upset.  She informs provider that she is still having problems concentrating and is more depressed and anxious.  She informed Clinical research associatewriter that since she discontinued her morning dose of Lamictal she has become more irritable and aggressive.   She is agreeable to discontinuing the Strattera 40 mg and starting Vyvanse 20 mg to help manage symptoms of ADHD.  She is also agreeable to  restarting her morning dose of Lamictal.  She will take 25 mg daily for 2 weeks and then increase to 50 mg.  She will continue her other medications as prescribed.  1. Bipolar I disorder (HCC)  Continue- lamoTRIgine (LAMICTAL) 100 MG tablet; Take 1 tablet (100 mg total) by mouth at bedtime.  Dispense: 30 tablet; Refill: 2 Continue- lithium 300 MG tablet; Take 1 tablet (300 mg total) by mouth daily.  Dispense: 30 tablet; Refill: 2 Continue- Lurasidone HCl (LATUDA) 60 MG TABS; Take 1 tablet (60 mg total) by mouth at bedtime.  Dispense: 30 tablet; Refill: 2 Coontinue- melatonin 1 MG TABS tablet; Take 1 tablet (1 mg total) by mouth at bedtime.  Dispense: 30 tablet; Refill: 2 Restart- lamoTRIgine (LAMICTAL) 25 MG tablet; Take 1 tablet (25 mg total) by mouth daily.  Dispense: 50 tablet; Refill: 1 2. Attention deficit hyperactivity disorder (ADHD), predominantly inattentive type  Start- lisdexamfetamine (VYVANSE) 20 MG capsule; Take 1 capsule (20 mg total) by mouth every morning.  Dispense: 30 capsule; Refill: 0  Follow-up in 1 month Therapy   Shanna Cisco, NP 02/21/2020, 10:50 AM

## 2020-02-26 ENCOUNTER — Encounter (HOSPITAL_COMMUNITY): Payer: Self-pay

## 2020-02-26 ENCOUNTER — Emergency Department (HOSPITAL_COMMUNITY)
Admission: EM | Admit: 2020-02-26 | Discharge: 2020-02-26 | Disposition: A | Discharge status: Home / Self Care | Payer: Medicaid Other | Attending: Emergency Medicine | Admitting: Emergency Medicine

## 2020-02-26 ENCOUNTER — Other Ambulatory Visit: Payer: Self-pay

## 2020-02-26 DIAGNOSIS — X158XXA Contact with other hot household appliances, initial encounter: Secondary | ICD-10-CM | POA: Insufficient documentation

## 2020-02-26 DIAGNOSIS — F17210 Nicotine dependence, cigarettes, uncomplicated: Secondary | ICD-10-CM | POA: Diagnosis not present

## 2020-02-26 DIAGNOSIS — T23001A Burn of unspecified degree of right hand, unspecified site, initial encounter: Secondary | ICD-10-CM | POA: Insufficient documentation

## 2020-02-26 DIAGNOSIS — J45909 Unspecified asthma, uncomplicated: Secondary | ICD-10-CM | POA: Insufficient documentation

## 2020-02-26 MED ORDER — SILVER SULFADIAZINE 1 % EX CREA: 1.0000 "application " | TOPICAL_CREAM | Freq: Every day | CUTANEOUS | 0 refills | Status: AC

## 2020-02-26 MED ORDER — OXYCODONE-ACETAMINOPHEN 5-325 MG PO TABS
1.0000 | ORAL_TABLET | ORAL | Status: DC | PRN
  Administered 2020-02-26: 1 via ORAL
  Filled 2020-02-26: qty 1

## 2020-02-26 MED ORDER — HYDROCODONE-ACETAMINOPHEN 5-325 MG PO TABS: 1.0000 | ORAL_TABLET | Freq: Four times a day (QID) | ORAL | 0 refills | Status: DC | PRN
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
## 2020-02-26 NOTE — ED Notes (Signed)
Pt provided care to L hand, hand noted to be red, skin intact.
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
## 2020-03-05 ENCOUNTER — Encounter (HOSPITAL_COMMUNITY): Payer: Medicaid Other | Admitting: Psychiatry

## 2020-03-19 ENCOUNTER — Encounter (HOSPITAL_COMMUNITY): Payer: Medicaid Other | Admitting: Psychiatry

## 2020-03-31 ENCOUNTER — Other Ambulatory Visit (HOSPITAL_COMMUNITY): Payer: Self-pay | Admitting: Psychiatry

## 2020-03-31 ENCOUNTER — Telehealth (HOSPITAL_COMMUNITY): Payer: Self-pay | Admitting: *Deleted

## 2020-03-31 DIAGNOSIS — F900 Attention-deficit hyperactivity disorder, predominantly inattentive type: Secondary | ICD-10-CM

## 2020-03-31 MED ORDER — LISDEXAMFETAMINE DIMESYLATE 20 MG PO CAPS: 20.0000 mg | ORAL_CAPSULE | ORAL | 0 refills | Status: DC

## 2020-03-31 NOTE — Telephone Encounter (Signed)
Medication refilled and sent to preferred pharmacy

## 2020-03-31 NOTE — Telephone Encounter (Signed)
Call from patient stating she missed her last appt, she has rescheduled and is seeing Dr Doyne Keel on 12/20 at 1130 but in the meantime she is out of her Vyvanse and asking for it to be called in in advance of her appt in one week. Will bring request to Drs attention.

## 2020-04-01 ENCOUNTER — Other Ambulatory Visit (HOSPITAL_COMMUNITY): Payer: Self-pay | Admitting: Psychiatry

## 2020-04-01 DIAGNOSIS — F900 Attention-deficit hyperactivity disorder, predominantly inattentive type: Secondary | ICD-10-CM

## 2020-04-07 ENCOUNTER — Other Ambulatory Visit: Payer: Self-pay

## 2020-04-07 ENCOUNTER — Ambulatory Visit (INDEPENDENT_AMBULATORY_CARE_PROVIDER_SITE_OTHER): Payer: Medicaid Other | Admitting: Psychiatry

## 2020-04-07 ENCOUNTER — Encounter (HOSPITAL_COMMUNITY): Payer: Self-pay | Admitting: Psychiatry

## 2020-04-07 DIAGNOSIS — F319 Bipolar disorder, unspecified: Secondary | ICD-10-CM | POA: Diagnosis not present

## 2020-04-07 DIAGNOSIS — F900 Attention-deficit hyperactivity disorder, predominantly inattentive type: Secondary | ICD-10-CM | POA: Diagnosis not present

## 2020-04-07 MED ORDER — MELATONIN 1 MG PO TABS: 1.0000 mg | ORAL_TABLET | Freq: Every day | ORAL | 2 refills | Status: DC

## 2020-04-07 MED ORDER — LAMOTRIGINE 100 MG PO TABS: 100.0000 mg | ORAL_TABLET | Freq: Two times a day (BID) | ORAL | 2 refills | Status: DC

## 2020-04-07 MED ORDER — LISDEXAMFETAMINE DIMESYLATE 20 MG PO CAPS: 20.0000 mg | ORAL_CAPSULE | ORAL | 0 refills | Status: DC

## 2020-04-07 MED ORDER — LITHIUM CARBONATE 300 MG PO TABS: 300.0000 mg | ORAL_TABLET | Freq: Every evening | ORAL | 2 refills | Status: DC

## 2020-04-07 MED ORDER — LATUDA 60 MG PO TABS: 60.0000 mg | ORAL_TABLET | Freq: Every day | ORAL | 2 refills | Status: DC

## 2020-04-07 NOTE — Progress Notes (Signed)
[facility] MD/PA/NP OP Progress Note  04/07/2020 2:36 PM Miranda Robertson  MRN:  [id number]  Chief Complaint: "Things have been going well" Chief Complaint    Follow-up     HPI: 29 year old female seen today for follow-up psychiatric evaluation.  She has a psychiatric history of Bipolar disorder 1, PTSD and depression.  She is currently managed on vyvanse 20 mg daily, Lamictal 100 mg twice daily, lithium 300 mg nightly, melatonin 1 mg nightly, and Latuda 60 mg nightly. She noted her medications are effective in managing her psychiatric condition.   Today she is well-groomed, pleasant, cooperative, engaged in conversation, and maintained eye contact.  She describes her mood as good notes that things has improved since her last visit.  She informed mother that she recently received a job at [employer] and reports that is going well.  She also informed provider that her mood has been stable and denies symptoms of mania.  Provider conducted a GAD-7 and patient scored a 16, at last visit patient scored a 16.  She notes that at times she is worried about her marriage but notes that she is coping with this well.  Provider also conducted a PHQ-9 and patient scored an 8, at last visit she scored a 16.   She denied SI/HI/VH or paranoia.   Patient informed Clinical research associate that her children are doing well.  She notes that she is excited about Christmas.  No medication changes made today.  Patient will continue medications as prescribed.  She will follow up with provider in 3 months for further evaluation.  She will also follow-up with outpatient counselor for therapy.  Patient notes that she will have her lithium levels redrawn prior to next visit.  No other concerns noted at this time.       Visit Diagnosis:    ICD-10-CM   1. Bipolar I disorder (HCC)  F31.9 lamoTRIgine (LAMICTAL) 100 MG tablet    Lurasidone HCl (LATUDA) 60 MG TABS    melatonin 1 MG TABS tablet    lithium 300 MG tablet  2. Attention deficit  hyperactivity disorder (ADHD), predominantly inattentive type  F90.0 lisdexamfetamine (VYVANSE) 20 MG capsule    Past Psychiatric History: Bipolar disorder 1, PTSD and depression   Past Medical History:  Past Medical History:  Diagnosis Date  . Asthma    Childhood  . HSV (herpes simplex virus) infection   . Mental disorder    bipolar, depression, mild menatl retardation, suicidal thoughts in 2010    Past Surgical History:  Procedure Laterality Date  . CESAREAN SECTION  06/09/12   FTP  . CESAREAN SECTION N/A 05/21/2014   Procedure: CESAREAN SECTION;  Surgeon: Reva Bores, MD;  Location: [facility];  Service: Obstetrics;  Laterality: N/A;  . CHOLECYSTECTOMY    . WISDOM TOOTH EXTRACTION      Family Psychiatric History: Depression mother, father, paternal grandmother, paternal aunts, paternal consins  Family History: No family history on file.  Social History:  Social History   Socioeconomic History  . Marital status: Single    Spouse name: Not on file  . Number of children: Not on file  . Years of education: Not on file  . Highest education level: Not on file  Occupational History  . Not on file  Tobacco Use  . Smoking status: Current Every Day Smoker    Types: Cigarettes  . Smokeless tobacco: Never Used  Substance and Sexual Activity  . Alcohol use: Yes  . Drug use: Yes  Types: Marijuana  . Sexual activity: Yes    Birth control/protection: Inserts  Other Topics Concern  . Not on file  Social History Narrative  . Not on file   Social Determinants of Health   Financial Resource Strain: Not on file  Food Insecurity: Not on file  Transportation Needs: Not on file  Physical Activity: Not on file  Stress: Not on file  Social Connections: Not on file    Allergies: No Known Allergies  Metabolic Disorder Labs: Lab Results  Component Value Date   HGBA1C 5.2 12/09/2017   MPG 111 01/26/2007   No results found for: PROLACTIN Lab Results  Component Value Date    CHOL 187 12/09/2017   TRIG 201 (H) 12/09/2017   HDL 53 12/09/2017   CHOLHDL 3.5 12/09/2017   VLDL 57 (H) 01/26/2007   LDLCALC 94 12/09/2017   LDLCALC 80 07/23/2016   Lab Results  Component Value Date   TSH 1.770 12/09/2017   TSH 0.942 07/23/2016    Therapeutic Level Labs: No results found for: LITHIUM No results found for: VALPROATE No components found for:  CBMZ  Current Medications: Current Outpatient Medications  Medication Sig Dispense Refill  . etonogestrel-ethinyl estradiol (NUVARING) 0.12-0.015 MG/24HR vaginal ring Insert vaginally and leave in place for 3 consecutive weeks, then remove for 1 week. 1 each 12  . HYDROcodone-acetaminophen (NORCO/VICODIN) 5-325 MG tablet Take 1-2 tablets by mouth every 6 (six) hours as needed. 8 tablet 0  . lamoTRIgine (LAMICTAL) 100 MG tablet Take 1 tablet (100 mg total) by mouth 2 (two) times daily. 60 tablet 2  . lisdexamfetamine (VYVANSE) 20 MG capsule Take 1 capsule (20 mg total) by mouth every morning. 30 capsule 0  . lithium 300 MG tablet Take 1 tablet (300 mg total) by mouth at bedtime. 30 tablet 2  . Lurasidone HCl (LATUDA) 60 MG TABS Take 1 tablet (60 mg total) by mouth at bedtime. 30 tablet 2  . melatonin 1 MG TABS tablet Take 1 tablet (1 mg total) by mouth at bedtime. 30 tablet 2   No current facility-administered medications for this visit.     Musculoskeletal: Strength & Muscle Tone: within normal limits Gait & Station: normal Patient leans: N/A  Psychiatric Specialty Exam: Review of Systems  Blood pressure 114/63, pulse 75, height 5\' 6"  (1.676 m), weight 262 lb (118.8 kg), SpO2 96 %.Body mass index is 42.29 kg/m.  General Appearance: Well Groomed  Eye Contact:  Good  Speech:  Clear and Coherent and Normal Rate  Volume:  Normal  Mood:  Euthymic and Notes that she is occasionally anxious and depressed  Affect:  Appropriate and Congruent  Thought Process:  Coherent, Goal Directed and Linear  Orientation:  Full (Time,  Place, and Person)  Thought Content: WDL and Logical   Suicidal Thoughts:  No  Homicidal Thoughts:  No  Memory:  Immediate;   Good Recent;   Good Remote;   Good  Judgement:  Good  Insight:  Good and Fair  Psychomotor Activity:  Normal  Concentration:  Concentration: Fair and Attention Span: Fair  Recall:  Good  Fund of Knowledge: Good  Language: Good  Akathisia:  No  Handed:  Right  AIMS (if indicated):Not done  Assets:  Communication Skills Desire for Improvement Financial Resources/Insurance Housing Intimacy Social Support  ADL's:  Intact  Cognition: WNL  Sleep:  Good   Screenings: AUDIT   Flowsheet Row Clinical Support from 02/21/2020 in [facility]  Alcohol Use Disorder  Identification Test Final Score (AUDIT) 15    GAD-7   Flowsheet Row Clinical Support from 04/07/2020 in [facility] Clinical Support from 02/21/2020 in [facility] Office Visit from 12/09/2017 in Center for [facility] Office Visit from 07/23/2016 in Center for [facility]  Total GAD-7 Score 16 16 7 11     PHQ2-9   Flowsheet Row Clinical Support from 04/07/2020 in [facility] Clinical Support from 02/21/2020 in [facility] Office Visit from 12/09/2017 in Center for [facility] Office Visit from 07/23/2016 in Center for [facility]  PHQ-2 Total Score 1 4 1 1   PHQ-9 Total Score 8 16 10 8        Assessment and Plan: Patient reports that she occasionally has anxiety and depression however reports that she is coping with it well.  No medication changes made today.  Patient agreeable to continue all medications as prescribed.  1. Bipolar I disorder (HCC)  Continue- lamoTRIgine (LAMICTAL) 100 MG tablet; Take 1 tablet (100 mg total) by mouth 2 (two) times daily.  Dispense: 60 tablet; Refill: 2 Continue-  Lurasidone HCl (LATUDA) 60 MG TABS; Take 1 tablet (60 mg total) by mouth at bedtime.  Dispense: 30 tablet; Refill: 2 Continue- melatonin 1 MG TABS tablet; Take 1 tablet (1 mg total) by mouth at bedtime.  Dispense: 30 tablet; Refill: 2 Continue- lithium 300 MG tablet; Take 1 tablet (300 mg total) by mouth at bedtime.  Dispense: 30 tablet; Refill: 2  2. Attention deficit hyperactivity disorder (ADHD), predominantly inattentive type  Continue- lisdexamfetamine (VYVANSE) 20 MG capsule; Take 1 capsule (20 mg total) by mouth every morning.  Dispense: 30 capsule; Refill: 0   Follow-up in 3 month Follow up with therapy   [facility], NP 04/07/2020, 2:36 PM

## 2020-05-06 ENCOUNTER — Other Ambulatory Visit (HOSPITAL_COMMUNITY): Payer: Self-pay | Admitting: Psychiatry

## 2020-05-06 DIAGNOSIS — F900 Attention-deficit hyperactivity disorder, predominantly inattentive type: Secondary | ICD-10-CM

## 2020-05-26 ENCOUNTER — Telehealth (HOSPITAL_COMMUNITY): Payer: Self-pay | Admitting: *Deleted

## 2020-05-26 NOTE — Telephone Encounter (Signed)
Call from patient stating she keeps getting medicine from her pharmacy for Strattera but states she is not on that anymore and doesn't want Brittney to put in on her form she was also calling to find out if we got it re her giving plasma.  Front desk got her form, will forward info to her provider. States she is now taking Vyvanse not Stratters.

## 2020-06-03 ENCOUNTER — Other Ambulatory Visit (HOSPITAL_COMMUNITY): Payer: Self-pay | Admitting: Psychiatry

## 2020-06-03 DIAGNOSIS — F900 Attention-deficit hyperactivity disorder, predominantly inattentive type: Secondary | ICD-10-CM

## 2020-06-16 ENCOUNTER — Ambulatory Visit: Payer: Self-pay | Admitting: Student

## 2020-06-19 ENCOUNTER — Telehealth (HOSPITAL_COMMUNITY): Payer: Self-pay | Admitting: *Deleted

## 2020-06-19 ENCOUNTER — Other Ambulatory Visit (HOSPITAL_COMMUNITY): Payer: Self-pay | Admitting: Psychiatry

## 2020-06-19 DIAGNOSIS — F900 Attention-deficit hyperactivity disorder, predominantly inattentive type: Secondary | ICD-10-CM

## 2020-06-19 MED ORDER — LISDEXAMFETAMINE DIMESYLATE 20 MG PO CAPS: 20.0000 mg | ORAL_CAPSULE | Freq: Every morning | ORAL | 0 refills | Status: DC

## 2020-06-19 NOTE — Telephone Encounter (Signed)
VM from patient re rx for Vyvanse. Reviewed record and I have a note in chart of 05/26/20 she called stating she has rx for Strattera but doesn't take Strattera, takes Vyvanse. I dont see a rx for Vyvanse since 12/21. She has an appt soon with Dr Doyne Keel on 07/07/20 at 3 pm. Will bring her concern to the provider, last progress note from provider states she is being prescribed Vyvanse

## 2020-06-19 NOTE — Telephone Encounter (Signed)
Vyvanse refilled and sent to preferred pharmacy.

## 2020-07-03 ENCOUNTER — Other Ambulatory Visit (HOSPITAL_COMMUNITY): Payer: Self-pay | Admitting: Psychiatry

## 2020-07-03 DIAGNOSIS — F319 Bipolar disorder, unspecified: Secondary | ICD-10-CM

## 2020-07-03 DIAGNOSIS — F900 Attention-deficit hyperactivity disorder, predominantly inattentive type: Secondary | ICD-10-CM

## 2020-07-07 ENCOUNTER — Telehealth (HOSPITAL_COMMUNITY): Payer: Medicaid Other | Admitting: Psychiatry

## 2020-07-07 ENCOUNTER — Other Ambulatory Visit: Payer: Self-pay

## 2020-07-10 ENCOUNTER — Other Ambulatory Visit: Payer: Self-pay

## 2020-07-10 ENCOUNTER — Encounter (HOSPITAL_COMMUNITY): Payer: Self-pay | Admitting: Psychiatry

## 2020-07-10 ENCOUNTER — Telehealth (INDEPENDENT_AMBULATORY_CARE_PROVIDER_SITE_OTHER): Payer: Medicaid Other | Admitting: Psychiatry

## 2020-07-10 ENCOUNTER — Ambulatory Visit (INDEPENDENT_AMBULATORY_CARE_PROVIDER_SITE_OTHER): Payer: Medicaid Other

## 2020-07-10 DIAGNOSIS — F900 Attention-deficit hyperactivity disorder, predominantly inattentive type: Secondary | ICD-10-CM | POA: Diagnosis not present

## 2020-07-10 DIAGNOSIS — F319 Bipolar disorder, unspecified: Secondary | ICD-10-CM

## 2020-07-10 DIAGNOSIS — F411 Generalized anxiety disorder: Secondary | ICD-10-CM | POA: Diagnosis not present

## 2020-07-10 DIAGNOSIS — Z3044 Encounter for surveillance of vaginal ring hormonal contraceptive device: Secondary | ICD-10-CM

## 2020-07-10 MED ORDER — FLUOXETINE HCL 10 MG PO CAPS: 10.0000 mg | ORAL_CAPSULE | Freq: Every day | ORAL | 2 refills | Status: DC

## 2020-07-10 MED ORDER — LISDEXAMFETAMINE DIMESYLATE 20 MG PO CAPS: 20.0000 mg | ORAL_CAPSULE | Freq: Every morning | ORAL | 0 refills | Status: DC

## 2020-07-10 MED ORDER — LAMOTRIGINE 100 MG PO TABS: 100.0000 mg | ORAL_TABLET | Freq: Two times a day (BID) | ORAL | 1 refills | Status: DC

## 2020-07-10 MED ORDER — LURASIDONE HCL 80 MG PO TABS
80.0000 mg | ORAL_TABLET | Freq: Every day | ORAL | 2 refills | Status: DC
Start: 2020-07-10 — End: 2020-09-10

## 2020-07-10 MED ORDER — MELATONIN 1 MG PO TABS: 2.0000 mg | ORAL_TABLET | Freq: Every day | ORAL | 2 refills | Status: DC

## 2020-07-10 NOTE — Progress Notes (Signed)
Patient not seen by provider after CMA verifies birth control refill on file and pap not due until August.

## 2020-07-10 NOTE — Progress Notes (Signed)
BH MD/PA/NP OP Progress Note Virtual Visit via Video Note  I connected with Miranda Robertson Bolender on 07/10/20 at  1:30 PM EDT by a video enabled telemedicine application and verified that I am speaking with the correct person using two identifiers.  Location: Patient: Home Provider: Clinic   I discussed the limitations of evaluation and management by telemedicine and the availability of in person appointments. The patient expressed understanding and agreed to proceed.  I provided 30 minutes of non-face-to-face time during this encounter.    07/10/2020 2:12 PM Miranda Robertson He  MRN:  [id number]  Chief Complaint: "I have been having rapid thoughts and my therapist notes that it could be severe anxiety"  HPI: 30 year old female seen today for follow-up psychiatric evaluation.  She has a psychiatric history of Bipolar disorder 1, PTSD and depression.  She is currently managed on vyvanse 20 mg daily, Lamictal 100 mg twice daily, lithium 300 mg nightly, melatonin 1 mg nightly, and Latuda 60 mg nightly. She noted her medications are somewhat effective in managing her psychiatric condition.   Today she is well-groomed, pleasant, cooperative, engaged in conversation, and maintained eye contact.  She notes that lately she has been having excessive worry and rapid thoughts that she believes in anxiety. She notes she is worried about her son, her daughter (that was adopted), and her fiance. Today provider conducted a GAD 7 and patient scored a 18, at her last visit she scored a 16. She also notes that at times she feels depressed but reports that it is situational. Provider conducted a PHQ 9 and patient scored a 20, at her last visit she scored an 8.  She denied SI/HI/VAH or paranoia. She notes that her sleep has poor noting that she sleeps 4 hours. She notes that she found trazodone ineffective and reports that she does not want to be on a sleep aid that increases her weight. She informed provider that her appetite is  poor and denies weight loss.   Patient informed provider that her mood has been stable on her medications. She however notes that she wants to discontinue lithium which she notes her previous provider noted was for racing thoughts. Provider instructed patient to cut lithium in half for a week and then discontinue it. She is also agreeable to increasing Latuda 60 mg to 80 mg to help manage mood. She is agreeable to increase Melatonin 1 mg to 2 mg (she notes that 3 mg is to sedating and makes her tired in the morning). She will start Prozac 10 mg to help manage anxiety and depression. Potential side effects of medication and risks vs benefits of treatment vs non-treatment were explained and discussed. All questions were answered.She will continue all other mediations as prescribed. No other concerns noted at this time.       Visit Diagnosis:    ICD-10-CM   1. Generalized anxiety disorder  F41.1 FLUoxetine (PROZAC) 10 MG capsule  2. Attention deficit hyperactivity disorder (ADHD), predominantly inattentive type  F90.0 lisdexamfetamine (VYVANSE) 20 MG capsule  3. Bipolar I disorder (HCC)  F31.9 melatonin 1 MG TABS tablet    lamoTRIgine (LAMICTAL) 100 MG tablet    FLUoxetine (PROZAC) 10 MG capsule    lurasidone (LATUDA) 80 MG TABS tablet    Past Psychiatric History: Bipolar disorder 1, PTSD and depression   Past Medical History:  Past Medical History:  Diagnosis Date  . Asthma    Childhood  . HSV (herpes simplex virus) infection   . Mental disorder  bipolar, depression, mild menatl retardation, suicidal thoughts in 2010    Past Surgical History:  Procedure Laterality Date  . CESAREAN SECTION  06/09/12   FTP  . CESAREAN SECTION N/A 05/21/2014   Procedure: CESAREAN SECTION;  Surgeon: Reva Bores, MD;  Location: [facility];  Service: Obstetrics;  Laterality: N/A;  . CHOLECYSTECTOMY    . WISDOM TOOTH EXTRACTION      Family Psychiatric History: Depression mother, father, paternal  grandmother, paternal aunts, paternal consins  Family History: No family history on file.  Social History:  Social History   Socioeconomic History  . Marital status: Single    Spouse name: Not on file  . Number of children: Not on file  . Years of education: Not on file  . Highest education level: Not on file  Occupational History  . Not on file  Tobacco Use  . Smoking status: Current Every Day Smoker    Types: Cigarettes  . Smokeless tobacco: Never Used  Substance and Sexual Activity  . Alcohol use: Yes  . Drug use: Yes    Types: Marijuana  . Sexual activity: Yes    Birth control/protection: Inserts  Other Topics Concern  . Not on file  Social History Narrative  . Not on file   Social Determinants of Health   Financial Resource Strain: Not on file  Food Insecurity: Not on file  Transportation Needs: Not on file  Physical Activity: Not on file  Stress: Not on file  Social Connections: Not on file    Allergies: No Known Allergies  Metabolic Disorder Labs: Lab Results  Component Value Date   HGBA1C 5.2 12/09/2017   MPG 111 01/26/2007   No results found for: PROLACTIN Lab Results  Component Value Date   CHOL 187 12/09/2017   TRIG 201 (H) 12/09/2017   HDL 53 12/09/2017   CHOLHDL 3.5 12/09/2017   VLDL 57 (H) 01/26/2007   LDLCALC 94 12/09/2017   LDLCALC 80 07/23/2016   Lab Results  Component Value Date   TSH 1.770 12/09/2017   TSH 0.942 07/23/2016    Therapeutic Level Labs: No results found for: LITHIUM No results found for: VALPROATE No components found for:  CBMZ  Current Medications: Current Outpatient Medications  Medication Sig Dispense Refill  . FLUoxetine (PROZAC) 10 MG capsule Take 1 capsule (10 mg total) by mouth daily. 30 capsule 2  . lurasidone (LATUDA) 80 MG TABS tablet Take 1 tablet (80 mg total) by mouth daily with breakfast. 30 tablet 2  . etonogestrel-ethinyl estradiol (NUVARING) 0.12-0.015 MG/24HR vaginal ring Insert vaginally and  leave in place for 3 consecutive weeks, then remove for 1 week. 1 each 12  . HYDROcodone-acetaminophen (NORCO/VICODIN) 5-325 MG tablet Take 1-2 tablets by mouth every 6 (six) hours as needed. 8 tablet 0  . lamoTRIgine (LAMICTAL) 100 MG tablet Take 1 tablet (100 mg total) by mouth 2 (two) times daily. 60 tablet 1  . lisdexamfetamine (VYVANSE) 20 MG capsule Take 1 capsule (20 mg total) by mouth every morning. 30 capsule 0  . melatonin 1 MG TABS tablet Take 2 tablets (2 mg total) by mouth at bedtime. 60 tablet 2   No current facility-administered medications for this visit.     Musculoskeletal: Strength & Muscle Tone: within normal limits Gait & Station: normal Patient leans: N/A  Psychiatric Specialty Exam: Review of Systems  There were no vitals taken for this visit.There is no height or weight on file to calculate BMI.  General Appearance: Well Groomed  Eye Contact:  Good  Speech:  Clear and Coherent and Normal Rate  Volume:  Normal  Mood:  Anxious and Depressed  Affect:  Appropriate and Congruent  Thought Process:  Coherent, Goal Directed and Linear  Orientation:  Full (Time, Place, and Person)  Thought Content: WDL and Logical   Suicidal Thoughts:  No  Homicidal Thoughts:  No  Memory:  Immediate;   Good Recent;   Good Remote;   Good  Judgement:  Good  Insight:  Good  Psychomotor Activity:  Normal  Concentration:  Concentration: Good and Attention Span: Good  Recall:  Good  Fund of Knowledge: Good  Language: Good  Akathisia:  No  Handed:  Right  AIMS (if indicated):Not done  Assets:  Communication Skills Desire for Improvement Financial Resources/Insurance Housing Intimacy Social Support  ADL's:  Intact  Cognition: WNL  Sleep:  Poor   Screenings: AUDIT   Flowsheet Row Clinical Support from 02/21/2020 in [facility]  Alcohol Use Disorder Identification Test Final Score (AUDIT) 15    GAD-7   Flowsheet Row Video Visit from 07/10/2020  in [facility] Clinical Support from 04/07/2020 in [facility] Clinical Support from 02/21/2020 in [facility] Office Visit from 12/09/2017 in Center for [facility] Office Visit from 07/23/2016 in Center for [facility]  Total GAD-7 Score 18 16 16 7 11     PHQ2-9   Flowsheet Row Video Visit from 07/10/2020 in [facility] Clinical Support from 04/07/2020 in [facility] Clinical Support from 02/21/2020 in [facility] Office Visit from 12/09/2017 in Center for [facility] Office Visit from 07/23/2016 in Center for [facility]  PHQ-2 Total Score 4 1 4 1 1   PHQ-9 Total Score 20 8 16 10 8     Flowsheet Row Video Visit from 07/10/2020 in [facility] ED from 05/03/2019 in [facility]  C-SSRS RISK CATEGORY No Risk High Risk       Assessment and Plan: Patient endorses symptoms of anxiety, depression, and insomnia. Patient informed provider that her mood has been stable on her medications. She however notes that she wants to discontinue lithium which she notes her previous provider noted was for racing thoughts. Provider instructed patient to cut lithium in half for a week and then discontinue it. She is also agreeable to increasing Latuda 60 mg to 80 mg to help manage mood. She is agreeable to increase Melatonin 1 mg to 2 mg (she notes that 3 mg is to sedating and makes her tired in the morning). She will start Prozac 10 mg to help manage anxiety and depression.  1. Attention deficit hyperactivity disorder (ADHD), predominantly inattentive type  Continue- lisdexamfetamine (VYVANSE) 20 MG capsule; Take 1 capsule (20 mg total) by mouth every morning.  Dispense: 30 capsule; Refill: 0  2. Bipolar I disorder  (HCC)  Increased- melatonin 1 MG TABS tablet; Take 2 tablets (2 mg total) by mouth at bedtime.  Dispense: 60 tablet; Refill: 2 Continue- lamoTRIgine (LAMICTAL) 100 MG tablet; Take 1 tablet (100 mg total) by mouth 2 (two) times daily.  Dispense: 60 tablet; Refill: 1 Start- FLUoxetine (PROZAC) 10 MG capsule; Take 1 capsule (10 mg total) by mouth daily.  Dispense: 30 capsule; Refill: 2 Increased- lurasidone (LATUDA) 80 MG TABS tablet; Take 1 tablet (80 mg total) by mouth daily with breakfast.  Dispense:  30 tablet; Refill: 2  3. Generalized anxiety disorder  Start- FLUoxetine (PROZAC) 10 MG capsule; Take 1 capsule (10 mg total) by mouth daily.  Dispense: 30 capsule; Refill: 2   Follow-up in 3 month Follow up with therapy   Shanna Cisco, NP 07/10/2020, 2:12 PM

## 2020-08-04 ENCOUNTER — Other Ambulatory Visit (HOSPITAL_COMMUNITY): Payer: Self-pay | Admitting: Psychiatry

## 2020-08-04 DIAGNOSIS — F900 Attention-deficit hyperactivity disorder, predominantly inattentive type: Secondary | ICD-10-CM

## 2020-08-05 ENCOUNTER — Telehealth (HOSPITAL_COMMUNITY): Payer: Medicaid Other | Admitting: Psychiatry

## 2020-09-03 ENCOUNTER — Other Ambulatory Visit (HOSPITAL_COMMUNITY): Payer: Self-pay | Admitting: Psychiatry

## 2020-09-03 ENCOUNTER — Telehealth (HOSPITAL_COMMUNITY): Payer: Self-pay | Admitting: Psychiatry

## 2020-09-03 DIAGNOSIS — F411 Generalized anxiety disorder: Secondary | ICD-10-CM

## 2020-09-03 DIAGNOSIS — F319 Bipolar disorder, unspecified: Secondary | ICD-10-CM

## 2020-09-03 MED ORDER — HYDROXYZINE HCL 10 MG PO TABS
10.0000 mg | ORAL_TABLET | Freq: Three times a day (TID) | ORAL | 2 refills | Status: DC | PRN
Start: 2020-09-03 — End: 2020-10-30

## 2020-09-03 NOTE — Telephone Encounter (Signed)
Provider spoke to patient who notes that her depression and mood are stable however notes she is anxious most days.  She informed provider that her anxiety is making her over think and endorses having racing thoughts.  Today she is agreeable to starting hydroxyzine 10 mg 3 times daily as needed to help manage anxiety.  She will follow-up with provider on 09/23/2020 for further evaluation.  No other concerns noted at this time.

## 2020-09-03 NOTE — Telephone Encounter (Signed)
Patient presented in office today, providing an envelope for "Miranda Robertson or Dr. Doyne Keel to complete" . Patient also requested to be seen sooner than next schedule appt. Writer was able to move patient's appt 2 weeks sooner, 6/7. Patient was agreeable to new scheduled appt, but is open to being contacted sooner if provider has availability. Patient states that her therapist requested for her to be seen sooner regards her "voices". Patient also requested for her prescriptions of lithium and stattera to be cancelled as she is not taking those anymore.

## 2020-09-10 ENCOUNTER — Telehealth (HOSPITAL_COMMUNITY): Payer: Self-pay | Admitting: Psychiatry

## 2020-09-10 ENCOUNTER — Encounter (HOSPITAL_COMMUNITY): Payer: Self-pay | Admitting: Psychiatry

## 2020-09-10 ENCOUNTER — Other Ambulatory Visit (HOSPITAL_COMMUNITY): Payer: Self-pay | Admitting: Psychiatry

## 2020-09-10 DIAGNOSIS — F319 Bipolar disorder, unspecified: Secondary | ICD-10-CM

## 2020-09-10 DIAGNOSIS — F900 Attention-deficit hyperactivity disorder, predominantly inattentive type: Secondary | ICD-10-CM

## 2020-09-10 DIAGNOSIS — F411 Generalized anxiety disorder: Secondary | ICD-10-CM

## 2020-09-10 NOTE — Telephone Encounter (Signed)
Patient has contacted office requesting a letter from provider stating that she no longer takes lithium so that she can donate plasma. Please contact the patient if you have any questions or need to discuss this letter.

## 2020-09-23 ENCOUNTER — Telehealth (HOSPITAL_COMMUNITY): Payer: Medicaid Other | Admitting: Psychiatry

## 2020-10-02 ENCOUNTER — Other Ambulatory Visit (HOSPITAL_COMMUNITY): Payer: Self-pay | Admitting: Psychiatry

## 2020-10-02 DIAGNOSIS — F900 Attention-deficit hyperactivity disorder, predominantly inattentive type: Secondary | ICD-10-CM

## 2020-10-06 ENCOUNTER — Telehealth (HOSPITAL_COMMUNITY): Payer: Medicaid Other | Admitting: Psychiatry

## 2020-10-06 ENCOUNTER — Other Ambulatory Visit: Payer: Self-pay

## 2020-10-22 ENCOUNTER — Other Ambulatory Visit (HOSPITAL_COMMUNITY): Payer: Self-pay | Admitting: Psychiatry

## 2020-10-22 DIAGNOSIS — F319 Bipolar disorder, unspecified: Secondary | ICD-10-CM

## 2020-10-30 ENCOUNTER — Encounter (HOSPITAL_COMMUNITY): Payer: Self-pay | Admitting: Psychiatry

## 2020-10-30 ENCOUNTER — Telehealth (INDEPENDENT_AMBULATORY_CARE_PROVIDER_SITE_OTHER): Payer: Medicaid Other | Admitting: Psychiatry

## 2020-10-30 DIAGNOSIS — F411 Generalized anxiety disorder: Secondary | ICD-10-CM | POA: Diagnosis not present

## 2020-10-30 DIAGNOSIS — F900 Attention-deficit hyperactivity disorder, predominantly inattentive type: Secondary | ICD-10-CM

## 2020-10-30 DIAGNOSIS — F319 Bipolar disorder, unspecified: Secondary | ICD-10-CM

## 2020-10-30 MED ORDER — LAMOTRIGINE 100 MG PO TABS: 100.0000 mg | ORAL_TABLET | Freq: Two times a day (BID) | ORAL | 2 refills | Status: DC

## 2020-10-30 MED ORDER — FLUOXETINE HCL 10 MG PO CAPS
10.0000 mg | ORAL_CAPSULE | Freq: Every day | ORAL | 2 refills | Status: DC
Start: 2020-10-30 — End: 2021-01-26

## 2020-10-30 MED ORDER — LURASIDONE HCL 80 MG PO TABS: 80.0000 mg | ORAL_TABLET | Freq: Every day | ORAL | 2 refills | Status: DC

## 2020-10-30 MED ORDER — LISDEXAMFETAMINE DIMESYLATE 20 MG PO CAPS: 20.0000 mg | ORAL_CAPSULE | Freq: Every morning | ORAL | 0 refills | Status: DC

## 2020-10-30 MED ORDER — MELATONIN 1 MG PO TABS: 2.0000 mg | ORAL_TABLET | Freq: Every day | ORAL | 2 refills | Status: DC

## 2020-10-30 NOTE — Progress Notes (Signed)
BH MD/PA/NP OP Progress Note Virtual Visit via Telephone Note  I connected with Miranda Robertson on 10/30/20 at  3:30 PM EDT by telephone and verified that I am speaking with the correct person using two identifiers.  Location: Patient: home Provider: Clinic   I discussed the limitations, risks, security and privacy concerns of performing an evaluation and management service by telephone and the availability of in person appointments. I also discussed with the patient that there may be a patient responsible charge related to this service. The patient expressed understanding and agreed to proceed.   I provided 30 minutes of non-face-to-face time during this encounter.    10/30/2020 4:38 PM Miranda Robertson  MRN:  [id number]  Chief Complaint: "Things are good"  HPI: 30 year old female seen today for follow-up psychiatric evaluation.  She has a psychiatric history of Bipolar disorder 1, PTSD and depression.  She is currently managed on vyvanse 20 mg daily, Lamictal 100 mg twice daily, melatonin 1-2 mg nightly, hydroxyzine 10 mg three times daily, and Latuda 80 mg nightly. She noted she discontinued hydroxyzine because it was too sedating and reports that her medications are effective in managing her psychiatric condition.   Today she is unable to login virtually so assessment was done over the phone.  During exam she was pleasant, cooperative, and engaged in conversation.  She informed Clinical research associate that since her last visit her mood has been stable and notes that her depression has improved.  She however notes that she continues to be anxious most days however notes that she discontinued hydroxyzine because it was too sedating.  She informed Clinical research associate that she has learned how to cope when of her anxiety.  Today provider conducted a GAD-7 and patient scored a 18, at her last visit she scored 18.  She notes that she worries about her relationships with her husband, her friendships, and how she is raising her son.   She notes that she and her husband has been getting along better and reports that that has helped her mental health.  Provider also conducted a PHQ-9 and patient scored a 9, at her last visit she scored a 20.  She endorses adequate sleep and poor appetite.  She denies recent weight loss.  Today she denies SI/HI/VAH, mania, or paranoia.    Patient informed Clinical research associate that she recently she spend time with her daughter.  She notes that her daughters adoptive family paid for her and her family to go to the zoo.  Today no medication changes made.  Patient agreeable to continue medications as prescribed.   No other concerns noted at this time.    Visit Diagnosis:    ICD-10-CM   1. Bipolar I disorder (HCC)  F31.9 FLUoxetine (PROZAC) 10 MG capsule    lamoTRIgine (LAMICTAL) 100 MG tablet    lurasidone (LATUDA) 80 MG TABS tablet    melatonin 1 MG TABS tablet    2. Generalized anxiety disorder  F41.1 FLUoxetine (PROZAC) 10 MG capsule    3. Attention deficit hyperactivity disorder (ADHD), predominantly inattentive type  F90.0 lisdexamfetamine (VYVANSE) 20 MG capsule      Past Psychiatric History: Bipolar disorder 1, PTSD and depression   Past Medical History:  Past Medical History:  Diagnosis Date   Asthma    Childhood   HSV (herpes simplex virus) infection    Mental disorder    bipolar, depression, mild menatl retardation, suicidal thoughts in 2010    Past Surgical History:  Procedure Laterality Date   CESAREAN SECTION  06/09/12   FTP   CESAREAN SECTION N/A 05/21/2014   Procedure: CESAREAN SECTION;  Surgeon: Reva Bores, MD;  Location: [facility];  Service: Obstetrics;  Laterality: N/A;   CHOLECYSTECTOMY     WISDOM TOOTH EXTRACTION      Family Psychiatric History: Depression mother, father, paternal grandmother, paternal aunts, paternal consins  Family History: History reviewed. No pertinent family history.  Social History:  Social History   Socioeconomic History   Marital status:  Single    Spouse name: Not on file   Number of children: Not on file   Years of education: Not on file   Highest education level: Not on file  Occupational History   Not on file  Tobacco Use   Smoking status: Every Day    Types: Cigarettes   Smokeless tobacco: Never  Substance and Sexual Activity   Alcohol use: Yes   Drug use: Yes    Types: Marijuana   Sexual activity: Yes    Birth control/protection: Inserts  Other Topics Concern   Not on file  Social History Narrative   Not on file   Social Determinants of Health   Financial Resource Strain: Not on file  Food Insecurity: Not on file  Transportation Needs: Not on file  Physical Activity: Not on file  Stress: Not on file  Social Connections: Not on file    Allergies: No Known Allergies  Metabolic Disorder Labs: Lab Results  Component Value Date   HGBA1C 5.2 12/09/2017   MPG 111 01/26/2007   No results found for: PROLACTIN Lab Results  Component Value Date   CHOL 187 12/09/2017   TRIG 201 (H) 12/09/2017   HDL 53 12/09/2017   CHOLHDL 3.5 12/09/2017   VLDL 57 (H) 01/26/2007   LDLCALC 94 12/09/2017   LDLCALC 80 07/23/2016   Lab Results  Component Value Date   TSH 1.770 12/09/2017   TSH 0.942 07/23/2016    Therapeutic Level Labs: No results found for: LITHIUM No results found for: VALPROATE No components found for:  CBMZ  Current Medications: Current Outpatient Medications  Medication Sig Dispense Refill   atomoxetine (STRATTERA) 40 MG capsule TAKE ONE CAPSULE BY MOUTH DAILY 30 capsule 0   etonogestrel-ethinyl estradiol (NUVARING) 0.12-0.015 MG/24HR vaginal ring Insert vaginally and leave in place for 3 consecutive weeks, then remove for 1 week. 1 each 12   FLUoxetine (PROZAC) 10 MG capsule Take 1 capsule (10 mg total) by mouth daily. 30 capsule 2   HYDROcodone-acetaminophen (NORCO/VICODIN) 5-325 MG tablet Take 1-2 tablets by mouth every 6 (six) hours as needed. 8 tablet 0   lamoTRIgine (LAMICTAL) 100  MG tablet Take 1 tablet (100 mg total) by mouth 2 (two) times daily. 60 tablet 2   lisdexamfetamine (VYVANSE) 20 MG capsule Take 1 capsule (20 mg total) by mouth every morning. 30 capsule 0   lurasidone (LATUDA) 80 MG TABS tablet Take 1 tablet (80 mg total) by mouth daily with breakfast. 30 tablet 2   melatonin 1 MG TABS tablet Take 2 tablets (2 mg total) by mouth at bedtime. 60 tablet 2   No current facility-administered medications for this visit.     Musculoskeletal: Strength & Muscle Tone:  Unable to assess due to telephone Gait & Station:  Unable to assess due to telephone Patient leans: N/A  Psychiatric Specialty Exam: Review of Systems  There were no vitals taken for this visit.There is no height or weight on file to calculate BMI.  General Appearance:  Unable to  assess due to telephone  Eye Contact:   Unable to assess due to telephone  Speech:  Clear and Coherent and Normal Rate  Volume:  Normal  Mood:  Anxious  Affect:  Appropriate and Congruent  Thought Process:  Coherent, Goal Directed and Linear  Orientation:  Full (Time, Place, and Person)  Thought Content: WDL and Logical   Suicidal Thoughts:  No  Homicidal Thoughts:  No  Memory:  Immediate;   Good Recent;   Good Remote;   Good  Judgement:  Good  Insight:  Good  Psychomotor Activity:  Normal  Concentration:  Concentration: Good and Attention Span: Good  Recall:  Good  Fund of Knowledge: Good  Language: Good  Akathisia:  No  Handed:  Right  AIMS (if indicated):Not done  Assets:  Communication Skills Desire for Improvement Financial Resources/Insurance Housing Intimacy Social Support  ADL's:  Intact  Cognition: WNL  Sleep:  Good   Screenings: AUDIT    Flowsheet Row Clinical Support from 02/21/2020 in [facility]  Alcohol Use Disorder Identification Test Final Score (AUDIT) 15      GAD-7    Flowsheet Row Video Visit from 10/30/2020 in [facility] Video Visit from 07/10/2020 in [facility] Clinical Support from 04/07/2020 in [facility] Clinical Support from 02/21/2020 in [facility] Office Visit from 12/09/2017 in Center for [facility]  Total GAD-7 Score 18 18 16 16 7       PHQ2-9    Flowsheet Row Video Visit from 10/30/2020 in [facility] Video Visit from 07/10/2020 in [facility] Clinical Support from 04/07/2020 in [facility] Clinical Support from 02/21/2020 in [facility] Office Visit from 12/09/2017 in [facility] for [facility]  PHQ-2 Total Score 1 4 1 4 1   PHQ-9 Total Score 9 20 8 16 10       Flowsheet Row Video Visit from 07/10/2020 in [facility] ED from 05/03/2019 in [facility]  C-SSRS RISK CATEGORY No Risk High Risk        Assessment and Plan: Patient endorses symptoms of anxiety however notes her depression has improved.  No medication changes made today.  Patient agreeable to continue medications as prescribed.  1. Attention deficit hyperactivity disorder (ADHD), predominantly inattentive type  Continue- lisdexamfetamine (VYVANSE) 20 MG capsule; Take 1 capsule (20 mg total) by mouth every morning.  Dispense: 30 capsule; Refill: 0  2. Bipolar I disorder (HCC)  Continue- melatonin 1 MG TABS tablet; Take 2 tablets (2 mg total) by mouth at bedtime.  Dispense: 60 tablet; Refill: 2 Continue- lamoTRIgine (LAMICTAL) 100 MG tablet; Take 1 tablet (100 mg total) by mouth 2 (two) times daily.  Dispense: 60 tablet; Refill: 1 Continue- FLUoxetine (PROZAC) 10 MG capsule; Take 1 capsule (10 mg total) by mouth daily.  Dispense: 30 capsule; Refill: 2 Increased- lurasidone (LATUDA) 80 MG TABS tablet; Take 1 tablet (80 mg total) by mouth daily  with breakfast.  Dispense: 30 tablet; Refill: 2  3. Generalized anxiety disorder  Continue- FLUoxetine (PROZAC) 10 MG capsule; Take 1 capsule (10 mg total) by mouth daily.  Dispense: 30 capsule; Refill: 2   Follow-up in 3 month    07/12/2020, NP 10/30/2020, 4:38 PM

## 2020-11-10 ENCOUNTER — Other Ambulatory Visit (HOSPITAL_COMMUNITY): Payer: Self-pay | Admitting: Psychiatry

## 2020-11-10 DIAGNOSIS — F411 Generalized anxiety disorder: Secondary | ICD-10-CM

## 2020-11-12 ENCOUNTER — Encounter (HOSPITAL_COMMUNITY): Payer: Medicaid Other | Admitting: Psychiatry

## 2020-11-26 ENCOUNTER — Other Ambulatory Visit (HOSPITAL_COMMUNITY): Payer: Self-pay | Admitting: Psychiatry

## 2020-11-26 DIAGNOSIS — F900 Attention-deficit hyperactivity disorder, predominantly inattentive type: Secondary | ICD-10-CM

## 2021-01-12 ENCOUNTER — Other Ambulatory Visit (HOSPITAL_COMMUNITY): Payer: Self-pay | Admitting: Psychiatry

## 2021-01-12 DIAGNOSIS — F411 Generalized anxiety disorder: Secondary | ICD-10-CM

## 2021-01-26 ENCOUNTER — Other Ambulatory Visit: Payer: Self-pay

## 2021-01-26 ENCOUNTER — Encounter (HOSPITAL_COMMUNITY): Payer: Self-pay | Admitting: Psychiatry

## 2021-01-26 ENCOUNTER — Telehealth (INDEPENDENT_AMBULATORY_CARE_PROVIDER_SITE_OTHER): Payer: Medicaid Other | Admitting: Psychiatry

## 2021-01-26 DIAGNOSIS — F900 Attention-deficit hyperactivity disorder, predominantly inattentive type: Secondary | ICD-10-CM

## 2021-01-26 DIAGNOSIS — F319 Bipolar disorder, unspecified: Secondary | ICD-10-CM

## 2021-01-26 DIAGNOSIS — F411 Generalized anxiety disorder: Secondary | ICD-10-CM

## 2021-01-26 MED ORDER — LURASIDONE HCL 80 MG PO TABS: 80.0000 mg | ORAL_TABLET | Freq: Every day | ORAL | 3 refills | Status: DC

## 2021-01-26 MED ORDER — LISDEXAMFETAMINE DIMESYLATE 20 MG PO CAPS: ORAL_CAPSULE | ORAL | 0 refills | Status: DC

## 2021-01-26 MED ORDER — HYDROXYZINE HCL 10 MG PO TABS: 10.0000 mg | ORAL_TABLET | Freq: Three times a day (TID) | ORAL | 3 refills | Status: DC | PRN

## 2021-01-26 MED ORDER — FLUOXETINE HCL 20 MG PO CAPS: 20.0000 mg | ORAL_CAPSULE | Freq: Every day | ORAL | 3 refills | Status: DC

## 2021-01-26 MED ORDER — MELATONIN 1 MG PO TABS: 2.0000 mg | ORAL_TABLET | Freq: Every day | ORAL | 3 refills | Status: DC

## 2021-01-26 MED ORDER — LAMOTRIGINE 100 MG PO TABS: 100.0000 mg | ORAL_TABLET | Freq: Two times a day (BID) | ORAL | 3 refills | Status: DC

## 2021-01-26 NOTE — Progress Notes (Signed)
BH MD/PA/NP OP Progress Note Virtual Visit via Video Note  I connected with Miranda Robertson on 01/26/21 at 11:00 AM EDT by a video enabled telemedicine application and verified that I am speaking with the correct person using two identifiers.  Location: Patient: Home Provider: Clinic   I discussed the limitations of evaluation and management by telemedicine and the availability of in person appointments. The patient expressed understanding and agreed to proceed.  I provided 30 minutes of non-face-to-face time during this encounter.     01/26/2021 11:22 AM Miranda Robertson  MRN:  [id number]  Chief Complaint: "The depression is good but I cant feel emotions"  HPI: 30 year old female seen today for follow-up psychiatric evaluation.  She has a psychiatric history of Bipolar disorder 1, PTSD and depression.  She is currently managed on vyvanse 20 mg daily, Lamictal 100 mg twice daily, melatonin 1-2 mg nightly,  Prozac 10 mg daily, and Latuda 80 mg nightly. She notes her medications are somewhat effective in managing her psychiatric conditions.    Today she is well-groomed, pleasant, cooperative, engaged in conversation, and maintains eye contact.  She informed Clinical research associate that her depression is well managed however notes that she is unable to feel emotions.  She notes that she feels blah throughout the day.  Provider conducted a GAD-7 and patient scored a 14, at her last visit she scored an 18.  She notes that she worries about everything (relationships, son, finances,).  Writer also conducted PHQ-9.  Scored a 19, at her last visit she scored a 9.  She notes that her sleep fluctuates because she is worried however reports that she is getting approximately 8 hours nightly.  She notes that her appetite continues to be poor however informed writer that she has maintained her weight.  Today she denies SI/HI/VAH, mania, or paranoia.    Provider asked patient when last time she started PCP and she notes that she  will follow-up with her tomorrow.  Provider encouraged patient to request lab draws such as thyroid panel and CBC.  She endorsed understanding and agreed.  Patient informed Clinical research associate that his 55-year-old son is now enrolled in football.  She also notes that her daughter is doing well with her adoptive family.  Today patient is agreeable to increasing Prozac 10 mg to 20 mg to help manage anxiety and depression. She will continue all other medications as prescribed.  No other concerns noted at this time    Visit Diagnosis:    ICD-10-CM   1. Attention deficit hyperactivity disorder (ADHD), predominantly inattentive type  F90.0 lisdexamfetamine (VYVANSE) 20 MG capsule    2. Bipolar I disorder (HCC)  F31.9 melatonin 1 MG TABS tablet    lurasidone (LATUDA) 80 MG TABS tablet    lamoTRIgine (LAMICTAL) 100 MG tablet    FLUoxetine (PROZAC) 20 MG capsule    3. Generalized anxiety disorder  F41.1 FLUoxetine (PROZAC) 20 MG capsule    DISCONTINUED: hydrOXYzine (ATARAX/VISTARIL) 10 MG tablet      Past Psychiatric History: Bipolar disorder 1, PTSD and depression   Past Medical History:  Past Medical History:  Diagnosis Date   Asthma    Childhood   HSV (herpes simplex virus) infection    Mental disorder    bipolar, depression, mild menatl retardation, suicidal thoughts in 2010    Past Surgical History:  Procedure Laterality Date   CESAREAN SECTION  06/09/12   FTP   CESAREAN SECTION N/A 05/21/2014   Procedure: CESAREAN SECTION;  Surgeon: Reva Bores,  MD;  Location: [facility];  Service: Obstetrics;  Laterality: N/A;   CHOLECYSTECTOMY     WISDOM TOOTH EXTRACTION      Family Psychiatric History: Depression mother, father, paternal grandmother, paternal aunts, paternal consins  Family History: History reviewed. No pertinent family history.  Social History:  Social History   Socioeconomic History   Marital status: Single    Spouse name: Not on file   Number of children: Not on file   Years of  education: Not on file   Highest education level: Not on file  Occupational History   Not on file  Tobacco Use   Smoking status: Every Day    Types: Cigarettes   Smokeless tobacco: Never  Substance and Sexual Activity   Alcohol use: Yes   Drug use: Yes    Types: Marijuana   Sexual activity: Yes    Birth control/protection: Inserts  Other Topics Concern   Not on file  Social History Narrative   Not on file   Social Determinants of Health   Financial Resource Strain: Not on file  Food Insecurity: Not on file  Transportation Needs: Not on file  Physical Activity: Not on file  Stress: Not on file  Social Connections: Not on file    Allergies: No Known Allergies  Metabolic Disorder Labs: Lab Results  Component Value Date   HGBA1C 5.2 12/09/2017   MPG 111 01/26/2007   No results found for: PROLACTIN Lab Results  Component Value Date   CHOL 187 12/09/2017   TRIG 201 (H) 12/09/2017   HDL 53 12/09/2017   CHOLHDL 3.5 12/09/2017   VLDL 57 (H) 01/26/2007   LDLCALC 94 12/09/2017   LDLCALC 80 07/23/2016   Lab Results  Component Value Date   TSH 1.770 12/09/2017   TSH 0.942 07/23/2016    Therapeutic Level Labs: No results found for: LITHIUM No results found for: VALPROATE No components found for:  CBMZ  Current Medications: Current Outpatient Medications  Medication Sig Dispense Refill   etonogestrel-ethinyl estradiol (NUVARING) 0.12-0.015 MG/24HR vaginal ring Insert vaginally and leave in place for 3 consecutive weeks, then remove for 1 week. 1 each 12   FLUoxetine (PROZAC) 20 MG capsule Take 1 capsule (20 mg total) by mouth daily. 30 capsule 3   HYDROcodone-acetaminophen (NORCO/VICODIN) 5-325 MG tablet Take 1-2 tablets by mouth every 6 (six) hours as needed. 8 tablet 0   lamoTRIgine (LAMICTAL) 100 MG tablet Take 1 tablet (100 mg total) by mouth 2 (two) times daily. 60 tablet 3   lisdexamfetamine (VYVANSE) 20 MG capsule TAKE ONE CAPSULE BY MOUTH DAILY IN THE  MORNING 30 capsule 0   lurasidone (LATUDA) 80 MG TABS tablet Take 1 tablet (80 mg total) by mouth daily with breakfast. 30 tablet 3   melatonin 1 MG TABS tablet Take 2 tablets (2 mg total) by mouth at bedtime. 60 tablet 3   No current facility-administered medications for this visit.     Musculoskeletal: Strength & Muscle Tone:  Unable to assess due to telehealth Gait & Station:  Unable to assess due to telehealth Patient leans: N/A  Psychiatric Specialty Exam: Review of Systems  There were no vitals taken for this visit.There is no height or weight on file to calculate BMI.  General Appearance: Well Groomed  Eye Contact:  Good  Speech:  Clear and Coherent and Normal Rate  Volume:  Normal  Mood:  Anxious and Depressed  Affect:  Appropriate and Congruent  Thought Process:  Coherent, Goal Directed and  Linear  Orientation:  Full (Time, Place, and Person)  Thought Content: WDL and Logical   Suicidal Thoughts:  No  Homicidal Thoughts:  No  Memory:  Immediate;   Good Recent;   Good Remote;   Good  Judgement:  Good  Insight:  Good  Psychomotor Activity:  Normal  Concentration:  Concentration: Good and Attention Span: Good  Recall:  Good  Fund of Knowledge: Good  Language: Good  Akathisia:  No  Handed:  Right  AIMS (if indicated):Not done  Assets:  Communication Skills Desire for Improvement Financial Resources/Insurance Housing Intimacy Social Support  ADL's:  Intact  Cognition: WNL  Sleep:  Good   Screenings: AUDIT    Flowsheet Row Clinical Support from 02/21/2020 in [facility]  Alcohol Use Disorder Identification Test Final Score (AUDIT) 15      GAD-7    Flowsheet Row Video Visit from 01/26/2021 in [facility] Video Visit from 10/30/2020 in [facility] Video Visit from 07/10/2020 in [facility] Clinical Support from 04/07/2020 in [facility] Clinical Support from 02/21/2020 in [facility]  Total GAD-7 Score 14 18 18 16 16       PHQ2-9    Flowsheet Row Video Visit from 01/26/2021 in [facility] Video Visit from 10/30/2020 in [facility] Video Visit from 07/10/2020 in [facility] Clinical Support from 04/07/2020 in [facility] Clinical Support from 02/21/2020 in [facility]  PHQ-2 Total Score 4 1 4 1 4   PHQ-9 Total Score 19 9 20 8 16       Flowsheet Row Video Visit from 07/10/2020 in [facility] ED from 05/03/2019 in [facility]  C-SSRS RISK CATEGORY No Risk High Risk        Assessment and Plan: Patient endorses symptoms of anxiety and depression.  She describes her mood as blah.  Today patient is agreeable to increasing Prozac 10 mg to 20 mg to help manage anxiety and depression. She will continue all other medications as prescribed.  1. Attention deficit hyperactivity disorder (ADHD), predominantly inattentive type  Continue- lisdexamfetamine (VYVANSE) 20 MG capsule; Take 1 capsule (20 mg total) by mouth every morning.  Dispense: 30 capsule; Refill: 0  2. Bipolar I disorder (HCC)  Continue- melatonin 1 MG TABS tablet; Take 2 tablets (2 mg total) by mouth at bedtime.  Dispense: 60 tablet; Refill: 3 Continue- lamoTRIgine (LAMICTAL) 100 MG tablet; Take 1 tablet (100 mg total) by mouth 2 (two) times daily.  Dispense: 60 tablet; Refill: 3 Increase- FLUoxetine (PROZAC) 20 MG capsule; Take 1 capsule (10 mg total) by mouth daily.  Dispense: 30 capsule; Refill: 3 Continue- lurasidone (LATUDA) 80 MG TABS tablet; Take 1 tablet (80 mg total) by mouth daily with breakfast.  Dispense: 30 tablet; Refill: 3  3. Generalized anxiety disorder  Increase- FLUoxetine (PROZAC) 20 MG capsule;  Take 1 capsule (10 mg total) by mouth daily.  Dispense: 30 capsule; Refill: 3   Follow-up in 3 month    07/12/2020, NP 01/26/2021, 11:22 AM

## 2021-02-24 ENCOUNTER — Other Ambulatory Visit: Payer: Self-pay | Admitting: Nurse Practitioner

## 2021-03-02 ENCOUNTER — Other Ambulatory Visit (HOSPITAL_COMMUNITY): Payer: Self-pay | Admitting: Psychiatry

## 2021-03-02 DIAGNOSIS — F900 Attention-deficit hyperactivity disorder, predominantly inattentive type: Secondary | ICD-10-CM

## 2021-04-28 ENCOUNTER — Encounter (HOSPITAL_COMMUNITY): Payer: Self-pay | Admitting: Psychiatry

## 2021-04-28 ENCOUNTER — Telehealth (INDEPENDENT_AMBULATORY_CARE_PROVIDER_SITE_OTHER): Payer: Medicaid Other | Admitting: Psychiatry

## 2021-04-28 DIAGNOSIS — F3177 Bipolar disorder, in partial remission, most recent episode mixed: Secondary | ICD-10-CM | POA: Diagnosis not present

## 2021-04-28 NOTE — Progress Notes (Signed)
[place] MD/PA/NP OP Progress Note Virtual Visit via Video Note  I connected with Miranda Robertson on 04/28/21 at  2:30 PM EST by a video enabled telemedicine application and verified that I am speaking with the correct person using two identifiers.  Location: Patient: Home Provider: Clinic   I discussed the limitations of evaluation and management by telemedicine and the availability of in person appointments. The patient expressed understanding and agreed to proceed.  I provided 30 minutes of non-face-to-face time during this encounter.     04/28/2021 2:59 PM Miranda Robertson  MRN:  [id number]  Chief Complaint: "I stopped taking my meds and I feel great"  HPI: 31 year old female seen today for follow-up psychiatric evaluation.  She has a psychiatric history of Bipolar disorder 1, PTSD and depression.  She is currently managed on vyvanse 20 mg daily, Lamictal 100 mg twice daily, melatonin 1-2 mg nightly,  Prozac 20 mg daily, and Latuda 80 mg nightly. She notes that she discontinued all of her medications and feels mentally stable.  Today she is well-groomed, pleasant, cooperative, engaged in conversation, and maintains eye contact.  She informed Probation officer that since discontinuing her medication she feels great.  She informed Probation officer that she and her husband have decided to get pregnant and notes that she would like to be without medication during her pregnancy.  She informed Probation officer that her mood is stable and denies symptoms of mania.  Patient notes at times she is anxious about her relationship, being in a good mother to her son, and her health.  She however notes that she is able to cope with the stress.  Today provider conducted a GAD-7 and patient scored a 11, at her last visit she scored an 14.  Writer also conducted PHQ-9.  Scored a 14, at her last visit she scored a 12.  She endorses adequate sleep and reports that she is trying to decrease her appetite as she would like to lose weight before she becomes  pregnant.  Today she denies SI/HI/VAH, mania, paranoia.    At this time patient notes that she would not like to restart her medications.  She will follow-up with provider in 3 months for further evaluation.  No other concerns noted at this time    Visit Diagnosis:    ICD-10-CM   1. Bipolar 1 disorder, mixed, partial remission ([place])  F31.77       Past Psychiatric History: Bipolar disorder 1, PTSD and depression   Past Medical History:  Past Medical History:  Diagnosis Date   Asthma    Childhood   HSV (herpes simplex virus) infection    Mental disorder    bipolar, depression, mild menatl retardation, suicidal thoughts in 2010    Past Surgical History:  Procedure Laterality Date   CESAREAN SECTION  06/09/12   FTP   CESAREAN SECTION N/A 05/21/2014   Procedure: CESAREAN SECTION;  Surgeon: Donnamae Jude, MD;  Location: [facility];  Service: Obstetrics;  Laterality: N/A;   CHOLECYSTECTOMY     WISDOM TOOTH EXTRACTION      Family Psychiatric History: Depression mother, father, paternal grandmother, paternal aunts, paternal consins  Family History: History reviewed. No pertinent family history.  Social History:  Social History   Socioeconomic History   Marital status: Single    Spouse name: Not on file   Number of children: Not on file   Years of education: Not on file   Highest education level: Not on file  Occupational History   Not on  file  Tobacco Use   Smoking status: Every Day    Types: Cigarettes   Smokeless tobacco: Never  Substance and Sexual Activity   Alcohol use: Yes   Drug use: Yes    Types: Marijuana   Sexual activity: Yes    Birth control/protection: Inserts  Other Topics Concern   Not on file  Social History Narrative   Not on file   Social Determinants of Health   Financial Resource Strain: Not on file  Food Insecurity: Not on file  Transportation Needs: Not on file  Physical Activity: Not on file  Stress: Not on file  Social Connections: Not on  file    Allergies: No Known Allergies  Metabolic Disorder Labs: Lab Results  Component Value Date   HGBA1C 5.2 12/09/2017   MPG 111 01/26/2007   No results found for: PROLACTIN Lab Results  Component Value Date   CHOL 187 12/09/2017   TRIG 201 (H) 12/09/2017   HDL 53 12/09/2017   CHOLHDL 3.5 12/09/2017   VLDL 57 (H) 01/26/2007   LDLCALC 94 12/09/2017   LDLCALC 80 07/23/2016   Lab Results  Component Value Date   TSH 1.770 12/09/2017   TSH 0.942 07/23/2016    Therapeutic Level Labs: No results found for: LITHIUM No results found for: VALPROATE No components found for:  CBMZ  Current Medications: Current Outpatient Medications  Medication Sig Dispense Refill   etonogestrel-ethinyl estradiol (NUVARING) 0.12-0.015 MG/24HR vaginal ring Insert vaginally and leave in place for 3 consecutive weeks, then remove for 1 week. 1 each 12   FLUoxetine (PROZAC) 20 MG capsule Take 1 capsule (20 mg total) by mouth daily. 30 capsule 3   HYDROcodone-acetaminophen (NORCO/VICODIN) 5-325 MG tablet Take 1-2 tablets by mouth every 6 (six) hours as needed. 8 tablet 0   lamoTRIgine (LAMICTAL) 100 MG tablet Take 1 tablet (100 mg total) by mouth 2 (two) times daily. 60 tablet 3   lisdexamfetamine (VYVANSE) 20 MG capsule TAKE ONE CAPSULE BY MOUTH DAILY IN THE MORNING 30 capsule 0   lurasidone (LATUDA) 80 MG TABS tablet Take 1 tablet (80 mg total) by mouth daily with breakfast. 30 tablet 3   melatonin 1 MG TABS tablet Take 2 tablets (2 mg total) by mouth at bedtime. 60 tablet 3   No current facility-administered medications for this visit.     Musculoskeletal: Strength & Muscle Tone:  Unable to assess due to telehealth [place]:  Unable to assess due to telehealth Patient leans: N/A  Psychiatric Specialty Exam: Review of Systems  There were no vitals taken for this visit.There is no height or weight on file to calculate BMI.  General Appearance: Well Groomed  Eye Contact:  Good   Speech:  Clear and Coherent and Normal Rate  Volume:  Normal  Mood:  Euthymic and reports able to cope with life stressors  Affect:  Appropriate and Congruent  Thought Process:  Coherent, Goal Directed and Linear  Orientation:  Full (Time, Place, and Person)  Thought Content: WDL and Logical   Suicidal Thoughts:  No  Homicidal Thoughts:  No  Memory:  Immediate;   Good Recent;   Good Remote;   Good  Judgement:  Good  Insight:  Good  Psychomotor Activity:  Normal  Concentration:  Concentration: Good and Attention Span: Good  Recall:  Good  Fund of Knowledge: Good  Language: Good  Akathisia:  No  Handed:  Right  AIMS (if indicated):Not done  Assets:  Communication Skills Desire for  Improvement Financial Resources/Insurance Housing Intimacy Social Support  ADL's:  Intact  Cognition: WNL  Sleep:  Good   Screenings: AUDIT    Flowsheet Row Clinical Support from 02/21/2020 in [facility]  Alcohol Use Disorder Identification Test Final Score (AUDIT) 15      GAD-7    Flowsheet Row Video Visit from 04/28/2021 in [facility] Video Visit from 01/26/2021 in [facility] Video Visit from 10/30/2020 in [facility] Video Visit from 07/10/2020 in [place] from 04/07/2020 in [facility]  Total GAD-7 Score 11 14 18 18 16       PHQ2-9    Flowsheet Row Video Visit from 04/28/2021 in [facility] Video Visit from 01/26/2021 in [facility] Video Visit from 10/30/2020 in [facility] Video Visit from 07/10/2020 in [place] from 04/07/2020 in [place]  PHQ-2 Total Score 1 4 1 4 1   PHQ-9 Total Score 14 19 9 20 8       Flowsheet Row Video Visit  from 07/10/2020 in [facility] ED from 05/03/2019 in [place] DEPT  C-SSRS RISK CATEGORY No Risk High Risk        Assessment and Plan: Patient informed writer that she discontinued her medications because she wants to become pregnant.  At this time she does not want to restart medications.  She did Artist that she feels her mood is stable and denies symptoms of mania.  She has stressors that cause anxiety and depression but reports she is able to cope with it.    1. Bipolar 1 disorder, mixed, partial remission ([place])    Follow-up in 3 month    Salley Slaughter, NP 04/28/2021, 2:59 PM

## 2021-05-05 ENCOUNTER — Other Ambulatory Visit (HOSPITAL_COMMUNITY): Payer: Self-pay | Admitting: Psychiatry

## 2021-05-05 DIAGNOSIS — F319 Bipolar disorder, unspecified: Secondary | ICD-10-CM

## 2021-05-05 DIAGNOSIS — F411 Generalized anxiety disorder: Secondary | ICD-10-CM

## 2021-07-13 ENCOUNTER — Telehealth (INDEPENDENT_AMBULATORY_CARE_PROVIDER_SITE_OTHER): Payer: Medicaid Other | Admitting: Psychiatry

## 2021-07-13 DIAGNOSIS — F319 Bipolar disorder, unspecified: Secondary | ICD-10-CM | POA: Diagnosis not present

## 2021-07-13 DIAGNOSIS — F3177 Bipolar disorder, in partial remission, most recent episode mixed: Secondary | ICD-10-CM | POA: Diagnosis not present

## 2021-07-13 DIAGNOSIS — F411 Generalized anxiety disorder: Secondary | ICD-10-CM

## 2021-07-13 DIAGNOSIS — F900 Attention-deficit hyperactivity disorder, predominantly inattentive type: Secondary | ICD-10-CM | POA: Diagnosis not present

## 2021-07-13 MED ORDER — LISDEXAMFETAMINE DIMESYLATE 20 MG PO CAPS: ORAL_CAPSULE | ORAL | 0 refills | Status: DC

## 2021-07-13 MED ORDER — LAMOTRIGINE 100 MG PO TABS: 100.0000 mg | ORAL_TABLET | Freq: Two times a day (BID) | ORAL | 2 refills | Status: DC

## 2021-07-13 MED ORDER — FLUOXETINE HCL 20 MG PO CAPS: 20.0000 mg | ORAL_CAPSULE | Freq: Every day | ORAL | 2 refills | Status: DC

## 2021-07-13 MED ORDER — LURASIDONE HCL 80 MG PO TABS: 80.0000 mg | ORAL_TABLET | Freq: Every day | ORAL | 2 refills | Status: DC

## 2021-07-13 MED ORDER — MELATONIN 1 MG PO TABS: 2.0000 mg | ORAL_TABLET | Freq: Every day | ORAL | 2 refills | Status: DC

## 2021-07-13 NOTE — Progress Notes (Signed)
BH MD/PA/NP OP Progress Note ? ?07/13/2021 3:42 PM ?Miranda Robertson  ?MRN:  [id number] ? ?Virtual Visit via Telephone Note ? ?I connected with Miranda Robertson on 07/13/21 at  2:00 PM EDT by telephone and verified that I am speaking with the correct person using two identifiers. ? ?Location: ?Patient: home ?Provider: offsite ?  ?I discussed the limitations, risks, security and privacy concerns of performing an evaluation and management service by telephone and the availability of in person appointments. I also discussed with the patient that there may be a patient responsible charge related to this service. The patient expressed understanding and agreed to proceed. ? ?  ?I discussed the assessment and treatment plan with the patient. The patient was provided an opportunity to ask questions and all were answered. The patient agreed with the plan and demonstrated an understanding of the instructions. ?  ?The patient was advised to call back or seek an in-person evaluation if the symptoms worsen or if the condition fails to improve as anticipated. ? ?I provided 10 minutes of non-face-to-face time during this encounter. ? ? ?Mcneil Sobericely Jaia Alonge, NP  ? ?Chief Complaint: medication management ? ?HPI: Miranda Robertson is a 31 year old [facility] follow-up psychiatric evaluation.  She has a psychiatric history of bipolar disorder and ADHD.  Her symptoms are managed with Prozac 20 mg daily, Lamictal 100 mg twice daily, Vyvanse 20 mg daily, Latuda 80 mg daily with breakfast and melatonin 2 mg at bedtime.  Patient reports that medications are effective and she is medication compliant.  Patient denies adverse effects and denies the need for dosage adjustment today.  No medication changes today. ? ?Patient is alert and oriented x4, calm pleasant and willing to engage.  She reports good appetite, mood and sleep cycle.  Patient denies suicidal or homicidal ideations, paranoia, delusional  thought, auditory or visual hallucinations. ? ?Visit Diagnosis:  ?  ICD-10-CM   ?1. Bipolar 1 disorder, mixed, partial remission (HCC)  F31.77   ?  ?2. Attention deficit hyperactivity disorder (ADHD), predominantly inattentive type  F90.0   ?  ? ? ?Past Psychiatric History: Bipolar disorder and ADHD ? ?Past Medical History:  ?Past Medical History:  ?Diagnosis Date  ? Asthma   ? Childhood  ? HSV (herpes simplex virus) infection   ? Mental disorder   ? bipolar, depression, mild menatl retardation, suicidal thoughts in 2010  ?  ?Past Surgical History:  ?Procedure Laterality Date  ? CESAREAN SECTION  06/09/12  ? FTP  ? CESAREAN SECTION N/A 05/21/2014  ? Procedure: CESAREAN SECTION;  Surgeon: Reva Boresanya S Pratt, MD;  Location: [facility];  Service: Obstetrics;  Laterality: N/A;  ? CHOLECYSTECTOMY    ? WISDOM TOOTH EXTRACTION    ? ? ?Family Psychiatric History: None known ? ?Family History: No family history on file. ? ?Social History:  ?Social History  ? ?Socioeconomic History  ? Marital status: Single  ?  Spouse name: Not on file  ? Number of children: Not on file  ? Years of education: Not on file  ? Highest education level: Not on file  ?Occupational History  ? Not on file  ?Tobacco Use  ? Smoking status: Every Day  ?  Types: Cigarettes  ? Smokeless tobacco: Never  ?Substance and Sexual Activity  ? Alcohol use: Yes  ? Drug use: Yes  ?  Types: Marijuana  ? Sexual activity: Yes  ?  Birth control/protection: Inserts  ?Other Topics Concern  ? Not on  file  ?Social History Narrative  ? Not on file  ? ?Social Determinants of Health  ? ?Financial Resource Strain: Not on file  ?Food Insecurity: Not on file  ?Transportation Needs: Not on file  ?Physical Activity: Not on file  ?Stress: Not on file  ?Social Connections: Not on file  ? ? ?Allergies: No Known Allergies ? ?Metabolic Disorder Labs: ?Lab Results  ?Component Value Date  ? HGBA1C 5.2 12/09/2017  ? MPG 111 01/26/2007  ? ?No results found for: PROLACTIN ?Lab Results  ?Component Value  Date  ? CHOL 187 12/09/2017  ? TRIG 201 (H) 12/09/2017  ? HDL 53 12/09/2017  ? CHOLHDL 3.5 12/09/2017  ? VLDL 57 (H) 01/26/2007  ? LDLCALC 94 12/09/2017  ? LDLCALC 80 07/23/2016  ? ?Lab Results  ?Component Value Date  ? TSH 1.770 12/09/2017  ? TSH 0.942 07/23/2016  ? ? ?Therapeutic Level Labs: ?No results found for: LITHIUM ?No results found for: VALPROATE ?No components found for:  CBMZ ? ?Current Medications: ?Current Outpatient Medications  ?Medication Sig Dispense Refill  ? etonogestrel-ethinyl estradiol (NUVARING) 0.12-0.015 MG/24HR vaginal ring Insert vaginally and leave in place for 3 consecutive weeks, then remove for 1 week. 1 each 12  ? FLUoxetine (PROZAC) 20 MG capsule TAKE ONE CAPSULE BY MOUTH DAILY 30 capsule 2  ? HYDROcodone-acetaminophen (NORCO/VICODIN) 5-325 MG tablet Take 1-2 tablets by mouth every 6 (six) hours as needed. 8 tablet 0  ? lamoTRIgine (LAMICTAL) 100 MG tablet Take 1 tablet (100 mg total) by mouth 2 (two) times daily. 60 tablet 3  ? lisdexamfetamine (VYVANSE) 20 MG capsule TAKE ONE CAPSULE BY MOUTH DAILY IN THE MORNING 30 capsule 0  ? lurasidone (LATUDA) 80 MG TABS tablet Take 1 tablet (80 mg total) by mouth daily with breakfast. 30 tablet 3  ? melatonin 1 MG TABS tablet Take 2 tablets (2 mg total) by mouth at bedtime. 60 tablet 3  ? ?No current facility-administered medications for this visit.  ? ? ? ?Musculoskeletal: ?Strength & Muscle Tone: N/A virtual visit  ?Gait & Station: N/A virtual visit ?Patient leans: N/A ? ?Psychiatric Specialty Exam: ?Review of Systems  ?Psychiatric/Behavioral:  Negative for hallucinations, self-injury and suicidal ideas.   ?All other systems reviewed and are negative.  ?There were no vitals taken for this visit.There is no height or weight on file to calculate BMI.  ?General Appearance: N/A  ?Eye Contact: N/A  ?Speech:  Clear and Coherent  ?Volume:  Normal  ?Mood:  Euthymic  ?Affect: N/A  ?Thought Process: Goal directed  ?Orientation: Full  ?Thought  Content: Logical  ?Suicidal Thoughts:  No  ?Homicidal Thoughts:  No  ?Memory: Good  ?Judgement:  Good  ?Insight:  Good  ?Psychomotor Activity:  NA  ?Concentration: Good  ?Recall:  Good  ?Fund of Knowledge: Good  ?Language: Good  ?Akathisia:  NA  ?Handed:  Right  ?AIMS (if indicated): not done  ?Assets:  Communication Skills ?Desire for Improvement  ?ADL's:  Intact  ?Cognition: WNL  ?Sleep:  Good  ? ?Screenings: ?AUDIT   ? ?Flowsheet Row Clinical Support from 02/21/2020 in [facility]  ?Alcohol Use Disorder Identification Test Final Score (AUDIT) 15  ? ?  ? ?GAD-7   ? ?Flowsheet Row Video Visit from 04/28/2021 in [facility] Video Visit from 01/26/2021 in [facility] Video Visit from 10/30/2020 in [facility] Video Visit from 07/10/2020 in [facility] Clinical Support from  04/07/2020 in [facility]  ?Total GAD-7 Score 11 14 18 18 16   ? ?  ? ?PHQ2-9   ? ?Flowsheet Row Video Visit from 04/28/2021 in [facility] Video Visit from 01/26/2021 in [facility] Video Visit from 10/30/2020 in [facility] Video Visit from 07/10/2020 in [facility] Clinical Support from 04/07/2020 in [facility]  ?PHQ-2 Total Score 1 4 1 4 1   ?PHQ-9 Total Score 14 19 9 20 8   ? ?  ? ?Flowsheet Row Video Visit from 07/10/2020 in [facility] ED from 05/03/2019 in [facility]  HOSPITAL-EMERGENCY DEPT  ?C-SSRS RISK CATEGORY No Risk High Risk  ? ?  ? ? ? ?Assessment and Plan: Dameka Younker is a 31 year old female presenting to [facility] behavioral health outpatient for follow-up psychiatric evaluation.  She has a psychiatric history of bipolar disorder and ADHD.  Her symptoms are managed with Prozac 20 mg  daily, Lamictal 100 mg twice daily, Vyvanse 20 mg daily, Latuda 80 mg daily with breakfast and melatonin 2 mg at bedtime.  Patient reports that medications are effective and she is medication compliant.  Patien

## 2021-07-17 ENCOUNTER — Other Ambulatory Visit: Payer: Self-pay

## 2021-07-17 ENCOUNTER — Emergency Department (HOSPITAL_BASED_OUTPATIENT_CLINIC_OR_DEPARTMENT_OTHER)
Admission: EM | Admit: 2021-07-17 | Discharge: 2021-07-17 | Disposition: A | Discharge status: Home / Self Care | Payer: Medicaid Other | Attending: Emergency Medicine | Admitting: Emergency Medicine

## 2021-07-17 ENCOUNTER — Encounter (HOSPITAL_BASED_OUTPATIENT_CLINIC_OR_DEPARTMENT_OTHER): Payer: Self-pay | Admitting: Emergency Medicine

## 2021-07-17 ENCOUNTER — Emergency Department (HOSPITAL_BASED_OUTPATIENT_CLINIC_OR_DEPARTMENT_OTHER): Payer: Medicaid Other | Admitting: Radiology

## 2021-07-17 DIAGNOSIS — R0781 Pleurodynia: Secondary | ICD-10-CM | POA: Insufficient documentation

## 2021-07-17 DIAGNOSIS — Y9361 Activity, american tackle football: Secondary | ICD-10-CM | POA: Diagnosis not present

## 2021-07-17 DIAGNOSIS — W1839XA Other fall on same level, initial encounter: Secondary | ICD-10-CM | POA: Insufficient documentation

## 2021-07-17 DIAGNOSIS — R0789 Other chest pain: Secondary | ICD-10-CM | POA: Diagnosis present

## 2021-07-17 HISTORY — DX: Depression, unspecified: F32.A

## 2021-07-17 LAB — PREGNANCY, URINE: Preg Test, Ur: NEGATIVE

## 2021-07-17 MED ORDER — IBUPROFEN 600 MG PO TABS: 600.0000 mg | ORAL_TABLET | Freq: Four times a day (QID) | ORAL | 0 refills | Status: DC | PRN

## 2021-07-17 MED ORDER — HYDROCODONE-ACETAMINOPHEN 5-325 MG PO TABS: 1.0000 | ORAL_TABLET | Freq: Four times a day (QID) | ORAL | 0 refills | Status: DC | PRN

## 2021-07-17 NOTE — ED Notes (Signed)
Patient transported to X-ray 

## 2021-07-17 NOTE — Discharge Instructions (Addendum)
Your xray did not show an obvious rib fracture but clinically it is still likely you have a fracture.  I included information for you to read over.  It is very important that you use as a respirometer at home to make sure you do not develop pneumonia.  You should use this taking 10 slow breaths at a time, 10 times a day for the next 10 days. ? ?You can take ibuprofen regularly for pain.  You should avoid taking meloxicam when you are taking ibuprofen.  If you have more severe pain you can take Norco which is a narcotic.  Be aware this can cause constipation.  Do not drive or operate heavy machinery after taking narcotics. ? ? ?

## 2021-07-17 NOTE — ED Notes (Signed)
Patient educated on the use and indications of IS. Patient showed good effort and understanding.  ?

## 2021-07-17 NOTE — ED Triage Notes (Signed)
On sat she was playing football with her son  went to reach for another kid that was racing her ,she fell and rolled over  her rt side and since it has hurt to walk and cough and bend down , denies  sob nausea or vomiting  ?

## 2021-07-17 NOTE — ED Provider Notes (Signed)
?[place] EMERGENCY DEPT ?Provider Note ? ? ?CSN: [id number] ?Arrival date & time: 07/17/21  1019 ? ?  ? ?History ? ?CC: Chest wall pain ? ? ?Miranda Robertson is a 31 y.o. female presenting with right chest wall pain after playing football several days ago with her son, falling over and landing on her right side.  The pain is sharp, pleuritic, worse with walking, coughing and movement. ? ?HPI ? ?  ? ?Home Medications ?Prior to Admission medications   ?Medication Sig Start Date End Date Taking? Authorizing Provider  ?HYDROcodone-acetaminophen (NORCO/VICODIN) 5-325 MG tablet Take 1 tablet by mouth every 6 (six) hours as needed for up to 15 doses. 07/17/21  Yes Tommy Goostree, Carola Rhine, MD  ?ibuprofen (ADVIL) 600 MG tablet Take 1 tablet (600 mg total) by mouth every 6 (six) hours as needed for up to 30 doses for moderate pain or mild pain. 07/17/21  Yes Wyvonnia Dusky, MD  ?etonogestrel-ethinyl estradiol (NUVARING) 0.12-0.015 MG/24HR vaginal ring Insert vaginally and leave in place for 3 consecutive weeks, then remove for 1 week. 02/19/20   Burleson, Rona Ravens, NP  ?FLUoxetine (PROZAC) 20 MG capsule Take 1 capsule (20 mg total) by mouth daily. 07/13/21   Penn, Lunette Stands, NP  ?HYDROcodone-acetaminophen (NORCO/VICODIN) 5-325 MG tablet Take 1-2 tablets by mouth every 6 (six) hours as needed. 02/26/20   Petrucelli, Samantha R, PA-C  ?lamoTRIgine (LAMICTAL) 100 MG tablet Take 1 tablet (100 mg total) by mouth 2 (two) times daily. 07/13/21   Penn, Lunette Stands, NP  ?lisdexamfetamine (VYVANSE) 20 MG capsule TAKE ONE CAPSULE BY MOUTH DAILY IN THE MORNING 07/13/21   Penn, Lunette Stands, NP  ?lurasidone (LATUDA) 80 MG TABS tablet Take 1 tablet (80 mg total) by mouth daily with breakfast. 07/13/21   Penn, Lunette Stands, NP  ?melatonin 1 MG TABS tablet Take 2 tablets (2 mg total) by mouth at bedtime. 07/13/21   Franne Grip, NP  ?   ? ?Allergies    ?Patient has no known allergies.   ? ?Review of Systems   ?Review of Systems ? ?Physical Exam ?Updated Vital  Signs ?BP 113/79   Pulse 70   Temp 98.1 ?F (36.7 ?C) (Oral)   Resp 16   Ht 5\' 6"  (1.676 m)   Wt 124.7 kg   SpO2 97%   BMI 44.39 kg/m?  ?Physical Exam ?Constitutional:   ?   General: She is not in acute distress. ?HENT:  ?   Head: Normocephalic and atraumatic.  ?Eyes:  ?   Conjunctiva/sclera: Conjunctivae normal.  ?   Pupils: Pupils are equal, round, and reactive to light.  ?Cardiovascular:  ?   Rate and Rhythm: Normal rate and regular rhythm.  ?Pulmonary:  ?   Effort: Pulmonary effort is normal. No respiratory distress.  ?Musculoskeletal:  ?   Comments: Right anterior and parasternal chest wall tenderness  ?Skin: ?   General: Skin is warm and dry.  ?Neurological:  ?   General: No focal deficit present.  ?   Mental Status: She is alert. Mental status is at baseline.  ? ? ?ED Results / Procedures / Treatments   ?Labs ?(all labs ordered are listed, but only abnormal results are displayed) ?Labs Reviewed  ?PREGNANCY, URINE  ? ? ?EKG ?EKG Interpretation ? ?Date/Time:  Friday July 17 2021 10:35:52 EDT ?Ventricular Rate:  63 ?PR Interval:  158 ?QRS Duration: 86 ?QT Interval:  428 ?QTC Calculation: 439 ?R Axis:   26 ?Text Interpretation: Sinus rhythm Low voltage, precordial leads Confirmed by Krystie Leiter,  Rodman Key [phone]) on 07/17/2021 10:55:54 AM ? ?Radiology ?DG Ribs Unilateral W/Chest Right ? ?Result Date: 07/17/2021 ?CLINICAL DATA:  Fall EXAM: RIGHT RIBS AND CHEST - 3+ VIEW COMPARISON:  05/22/2019 FINDINGS: No displaced fracture or other bone lesions are seen involving the ribs. There is no evidence of pneumothorax or pleural effusion. Both lungs are clear. Heart size and mediastinal contours are within normal limits. IMPRESSION: No displaced rib fracture is seen. Electronically Signed   By: Merilyn Baba M.D.   On: 07/17/2021 11:06   ? ?Procedures ?Procedures  ? ? ?Medications Ordered in ED ?Medications - No data to display ? ?ED Course/ Medical Decision Making/ A&P ?  ?                        ?Medical Decision  Making ?Amount and/or Complexity of Data Reviewed ?Labs: ordered. ?Radiology: ordered. ? ?Risk ?Prescription drug management. ? ? ?Patient is here chest wall pain, clinically I suspect this is likely a fracture of the ribs per her exam.  She has no hypoxia, no respiratory distress.  I do not see evidence of pneumothorax on her x-rays, which I personally reviewed and interpreted, and which were notable for no visibly displaced rib fractures.  However as explained to the patient a large number of nondisplaced rib fractures are not seen on plain x-ray films of the chest, clinically I would still treat her for suspected rib fracture ? ?I doubt pneumonia or pulmonary embolism ? ?SPO2 of 0% entered in error in the chart - no hypoxia on exam ? ?We will treat for suspected rib fracture with pain control at home, incentive spirometer teaching here.  Follow-up with PCP. ? ?  ? ? ? ? ? ? ? ? ?Final Clinical Impression(s) / ED Diagnoses ?Final diagnoses:  ?Rib pain  ? ? ?Rx / DC Orders ?ED Discharge Orders   ? ?      Ordered  ?  HYDROcodone-acetaminophen (NORCO/VICODIN) 5-325 MG tablet  Every 6 hours PRN       ? 07/17/21 1204  ?  ibuprofen (ADVIL) 600 MG tablet  Every 6 hours PRN       ? 07/17/21 1204  ? ?  ?  ? ?  ? ? ?  ?Wyvonnia Dusky, MD ?07/17/21 1436 ? ?

## 2021-09-02 ENCOUNTER — Emergency Department (HOSPITAL_BASED_OUTPATIENT_CLINIC_OR_DEPARTMENT_OTHER)
Admission: EM | Admit: 2021-09-02 | Discharge: 2021-09-02 | Disposition: A | Discharge status: Home / Self Care | Payer: Medicaid Other | Attending: Emergency Medicine | Admitting: Emergency Medicine

## 2021-09-02 ENCOUNTER — Other Ambulatory Visit: Payer: Self-pay

## 2021-09-02 ENCOUNTER — Emergency Department (HOSPITAL_BASED_OUTPATIENT_CLINIC_OR_DEPARTMENT_OTHER): Payer: Medicaid Other

## 2021-09-02 DIAGNOSIS — G44221 Chronic tension-type headache, intractable: Secondary | ICD-10-CM | POA: Diagnosis not present

## 2021-09-02 DIAGNOSIS — R519 Headache, unspecified: Secondary | ICD-10-CM | POA: Diagnosis present

## 2021-09-02 LAB — HCG, SERUM, QUALITATIVE: Preg, Serum: NEGATIVE

## 2021-09-02 MED ORDER — KETOROLAC TROMETHAMINE 15 MG/ML IJ SOLN
15.0000 mg | Freq: Once | INTRAMUSCULAR | Status: AC
  Administered 2021-09-02: 15 mg via INTRAVENOUS
  Filled 2021-09-02: qty 1

## 2021-09-02 MED ORDER — METOCLOPRAMIDE HCL 5 MG/ML IJ SOLN
10.0000 mg | Freq: Once | INTRAMUSCULAR | Status: AC
  Administered 2021-09-02: 10 mg via INTRAVENOUS
  Filled 2021-09-02: qty 2

## 2021-09-02 MED ORDER — SODIUM CHLORIDE 0.9 % IV BOLUS
500.0000 mL | Freq: Once | INTRAVENOUS | Status: AC
  Administered 2021-09-02: 500 mL via INTRAVENOUS

## 2021-09-02 MED ORDER — DIPHENHYDRAMINE HCL 50 MG/ML IJ SOLN
25.0000 mg | Freq: Once | INTRAMUSCULAR | Status: AC
  Administered 2021-09-02: 25 mg via INTRAVENOUS
  Filled 2021-09-02: qty 1

## 2021-09-02 NOTE — Discharge Instructions (Addendum)
Follow-up with neurology within the week. ? ?Return to the ER if your symptoms worsen, you have new numbness weakness new symptoms or any additional concerns return back to the ER.  Otherwise follow-up with neurologist within this week.  You will need to call to make an appointment. ? ?

## 2021-09-02 NOTE — ED Provider Notes (Signed)
Patient signed out to me is pending repeat evaluation. ? ?Patient states that she has had migraines for most of her life, last migraine was about a month ago.  However this migraine that she presents today has been there for about 3 weeks.  She states it was gradual onset but persistent for 3 weeks despite medication she took at home.  Describes it as throbbing worse with light.  Given migraine cocktail here with improvement.  Denies vision changes. ? ?On my exam she has no focal neurodeficit.  Headaches have been ongoing for 3 weeks with negative CT imaging today.  Will advise outpatient follow-up with neurology.  Advised return for fevers worsening symptoms or any additional concerns. ?  ?Cheryll Cockayne, MD ?09/02/21 1636 ? ?

## 2021-09-02 NOTE — ED Triage Notes (Signed)
She c/o generalized h/a x 3 weeks. This persists, in spite of her seeing her pcp last week. She denies fever, and reports a few (~3) episodes of N/V/D since yesterday. She is in no distress. She tells me she has hx of "migraine" ?

## 2021-09-02 NOTE — ED Notes (Signed)
Patient verbalizes understanding of discharge instructions. Opportunity for questioning and answers were provided. Patient discharged from ED.  °

## 2021-09-02 NOTE — ED Provider Notes (Signed)
?[facility] EMERGENCY DEPT ?Provider Note ? ? ?CSN: [id number] ?Arrival date & time: 09/02/21  1206 ? ?  ? ?History ? ?Chief Complaint  ?Patient presents with  ? Headache  ? ? ?Chiann Goffredo is a 31 y.o. female. ? ?Patient is a 32 year old female with PMH of migraines, bipolar disorder, ADHD, gestational hypertension presenting with intractable headache for approximately 3 weeks.  She reports that in the past she has had migraines and was able to take Aleve or Excedrin and the headache would resolve.  She reports that she has had a headache which has been constant for the past 3 weeks.  She reports that she goes to bed with a headache and when she wakes up she still has a headache.  She is taking Excedrin, Aleve, ibuprofen, caffeine to try and help with the headaches but they have not resolved.  She has a chronic marijuana smoker but reports that she has been unable to smoke for the last 3 weeks because of the headache.  Denies any changes in vision but does report dizziness with a headache.  Denies any photophobia.  Denies any slurred speech or weakness in her upper or lower extremities.  She reports that she went to her PCP at [facility] and had lab work done which all came back within normal limits. ? ? ?  ? ?Home Medications ?Prior to Admission medications   ?Medication Sig Start Date End Date Taking? Authorizing Provider  ?etonogestrel-ethinyl estradiol (NUVARING) 0.12-0.015 MG/24HR vaginal ring Insert vaginally and leave in place for 3 consecutive weeks, then remove for 1 week. 02/19/20   Burleson, Brand Males, NP  ?FLUoxetine (PROZAC) 20 MG capsule Take 1 capsule (20 mg total) by mouth daily. 07/13/21   Penn, Cranston Neighbor, NP  ?HYDROcodone-acetaminophen (NORCO/VICODIN) 5-325 MG tablet Take 1-2 tablets by mouth every 6 (six) hours as needed. 02/26/20   Petrucelli, Samantha R, PA-C  ?HYDROcodone-acetaminophen (NORCO/VICODIN) 5-325 MG tablet Take 1 tablet by mouth every 6 (six) hours as needed for up  to 15 doses. 07/17/21   Terald Sleeper, MD  ?ibuprofen (ADVIL) 600 MG tablet Take 1 tablet (600 mg total) by mouth every 6 (six) hours as needed for up to 30 doses for moderate pain or mild pain. 07/17/21   Terald Sleeper, MD  ?lamoTRIgine (LAMICTAL) 100 MG tablet Take 1 tablet (100 mg total) by mouth 2 (two) times daily. 07/13/21   Penn, Cranston Neighbor, NP  ?lisdexamfetamine (VYVANSE) 20 MG capsule TAKE ONE CAPSULE BY MOUTH DAILY IN THE MORNING 07/13/21   Penn, Cranston Neighbor, NP  ?lurasidone (LATUDA) 80 MG TABS tablet Take 1 tablet (80 mg total) by mouth daily with breakfast. 07/13/21   Penn, Cranston Neighbor, NP  ?melatonin 1 MG TABS tablet Take 2 tablets (2 mg total) by mouth at bedtime. 07/13/21   Mcneil Sober, NP  ?   ? ?Allergies    ?Patient has no known allergies.   ? ?Review of Systems   ?Review of Systems  ?Constitutional:  Positive for chills. Negative for fever.  ?HENT:  Negative for congestion and rhinorrhea.   ?Eyes:  Negative for visual disturbance.  ?Respiratory:  Negative for shortness of breath.   ?Cardiovascular:  Negative for chest pain.  ?Gastrointestinal:  Positive for abdominal pain and nausea.  ?Genitourinary:  Negative for difficulty urinating.  ?Musculoskeletal:  Negative for arthralgias.  ?Skin:  Negative for rash.  ?Neurological:  Positive for dizziness and headaches.  ?All other systems reviewed and are negative. ? ?Physical Exam ?Updated Vital Signs ?  BP (!) 114/91   Pulse 75   Temp 98.3 ?F (36.8 ?C) (Oral)   Resp 16   LMP 08/12/2021   SpO2 99%  ?Physical Exam ?Vitals and nursing note reviewed.  ?Constitutional:   ?   General: She is not in acute distress. ?   Appearance: She is well-developed.  ?HENT:  ?   Head: Normocephalic and atraumatic.  ?Eyes:  ?   General: No scleral icterus. ?   Extraocular Movements: Extraocular movements intact.  ?   Conjunctiva/sclera: Conjunctivae normal.  ?   Pupils: Pupils are equal, round, and reactive to light.  ?Cardiovascular:  ?   Rate and Rhythm: Normal rate and regular  rhythm.  ?   Heart sounds: No murmur heard. ?Pulmonary:  ?   Effort: Pulmonary effort is normal. No respiratory distress.  ?   Breath sounds: Normal breath sounds.  ?Abdominal:  ?   Palpations: Abdomen is soft.  ?   Tenderness: There is no abdominal tenderness.  ?Musculoskeletal:     ?   General: No swelling.  ?   Cervical back: Neck supple.  ?Skin: ?   General: Skin is warm and dry.  ?   Capillary Refill: Capillary refill takes less than 2 seconds.  ?Neurological:  ?   Mental Status: She is alert and oriented to person, place, and time.  ?   Cranial Nerves: No cranial nerve deficit, dysarthria or facial asymmetry.  ?Psychiatric:     ?   Mood and Affect: Mood normal.  ? ? ?ED Results / Procedures / Treatments   ?Labs ?(all labs ordered are listed, but only abnormal results are displayed) ?Labs Reviewed  ?PREGNANCY, URINE  ? ? ?EKG ?None ? ?Radiology ?CT Head Wo Contrast ? ?Result Date: 09/02/2021 ?CLINICAL DATA:  Headache EXAM: CT HEAD WITHOUT CONTRAST TECHNIQUE: Contiguous axial images were obtained from the base of the skull through the vertex without intravenous contrast. RADIATION DOSE REDUCTION: This exam was performed according to the departmental dose-optimization program which includes automated exposure control, adjustment of the mA and/or kV according to patient size and/or use of iterative reconstruction technique. COMPARISON:  None Available. FINDINGS: Brain: No acute intracranial hemorrhage, mass effect, or herniation. No extra-axial fluid collections. No evidence of acute territorial infarct. No hydrocephalus. Vascular: No hyperdense vessel or unexpected calcification. Skull: Normal. Negative for fracture or focal lesion. Sinuses/Orbits: No acute finding. Other: None. IMPRESSION: No acute intracranial process identified. Electronically Signed   By: Jannifer Hick M.D.   On: 09/02/2021 14:39   ? ?Procedures ?Procedures  ? ? ?Medications Ordered in ED ?Medications  ?metoCLOPramide (REGLAN) injection 10  mg (has no administration in time range)  ?diphenhydrAMINE (BENADRYL) injection 25 mg (has no administration in time range)  ?ketorolac (TORADOL) 15 MG/ML injection 15 mg (has no administration in time range)  ?sodium chloride 0.9 % bolus 500 mL (500 mLs Intravenous New Bag/Given 09/02/21 1359)  ? ? ?ED Course/ Medical Decision Making/ A&P ?  ?                        ?Medical Decision Making ?Patient is 31 year old female with 3-week history of intractable headache.  History of migraines although her migraines are usually responsive to medications.  Also not having photophobia which is usual for her migraines.  On arrival vital signs within normal limits.  Differential includes atypical migraine versus intracranial abnormality.  Given the duration and no response to medications noncontrast CT head ordered. ? ?On  reevaluation patient reports she is still having her headache.  Noncontrast CT showing no acute intracranial abnormalities.  Will trial headache cocktail with Reglan, Benadryl, Toradol.  If patient responds well to this she will likely discharge.  Patient handed off to oncoming provider to reassess after headache cocktail and possibly discharge. ? ? ?Final Clinical Impression(s) / ED Diagnoses ?Final diagnoses:  ?Chronic tension-type headache, intractable  ? ? ?Rx / DC Orders ?ED Discharge Orders   ? ? None  ? ?  ? ? ?  ?Derrel Nipresenzo, Victor, MD ?09/02/21 1455 ? ?  ?Cheryll CockayneHong, Joshua S, MD ?09/02/21 1635 ? ?

## 2021-09-26 ENCOUNTER — Ambulatory Visit (HOSPITAL_COMMUNITY)
Admission: EM | Admit: 2021-09-26 | Discharge: 2021-09-26 | Disposition: A | Discharge status: Home / Self Care | Payer: Medicaid Other | Attending: Urology | Admitting: Urology

## 2021-09-26 DIAGNOSIS — F909 Attention-deficit hyperactivity disorder, unspecified type: Secondary | ICD-10-CM | POA: Insufficient documentation

## 2021-09-26 DIAGNOSIS — Z79899 Other long term (current) drug therapy: Secondary | ICD-10-CM | POA: Insufficient documentation

## 2021-09-26 DIAGNOSIS — Z046 Encounter for general psychiatric examination, requested by authority: Secondary | ICD-10-CM | POA: Insufficient documentation

## 2021-09-26 DIAGNOSIS — F319 Bipolar disorder, unspecified: Secondary | ICD-10-CM | POA: Insufficient documentation

## 2021-09-26 DIAGNOSIS — F419 Anxiety disorder, unspecified: Secondary | ICD-10-CM | POA: Insufficient documentation

## 2021-09-26 MED ORDER — ACETAMINOPHEN 325 MG PO TABS
650.0000 mg | ORAL_TABLET | Freq: Once | ORAL | Status: AC
  Administered 2021-09-26: 650 mg via ORAL
  Filled 2021-09-26: qty 2
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
## 2021-10-07 ENCOUNTER — Encounter (HOSPITAL_COMMUNITY): Payer: Self-pay | Admitting: Psychiatry

## 2021-10-07 ENCOUNTER — Telehealth (INDEPENDENT_AMBULATORY_CARE_PROVIDER_SITE_OTHER): Payer: Medicaid Other | Admitting: Psychiatry

## 2021-10-07 DIAGNOSIS — F319 Bipolar disorder, unspecified: Secondary | ICD-10-CM | POA: Diagnosis not present

## 2021-10-07 DIAGNOSIS — F411 Generalized anxiety disorder: Secondary | ICD-10-CM | POA: Diagnosis not present

## 2021-10-07 DIAGNOSIS — F900 Attention-deficit hyperactivity disorder, predominantly inattentive type: Secondary | ICD-10-CM

## 2021-10-07 MED ORDER — LURASIDONE HCL 80 MG PO TABS: 80.0000 mg | ORAL_TABLET | Freq: Every day | ORAL | 3 refills | Status: DC

## 2021-10-07 MED ORDER — FLUOXETINE HCL 20 MG PO CAPS: 20.0000 mg | ORAL_CAPSULE | Freq: Every day | ORAL | 3 refills | Status: DC

## 2021-10-07 MED ORDER — LISDEXAMFETAMINE DIMESYLATE 20 MG PO CAPS: ORAL_CAPSULE | ORAL | 0 refills | Status: DC

## 2021-10-07 MED ORDER — UNISOM SLEEPMELTS 25 MG PO TBDP: 25.0000 mg | ORAL_TABLET | Freq: Every evening | ORAL | 3 refills | Status: DC

## 2021-10-07 MED ORDER — MELATONIN 1 MG PO TABS: 2.0000 mg | ORAL_TABLET | Freq: Every day | ORAL | 3 refills | Status: DC

## 2021-10-07 MED ORDER — LAMOTRIGINE 100 MG PO TABS: 100.0000 mg | ORAL_TABLET | Freq: Two times a day (BID) | ORAL | 3 refills | Status: DC
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
## 2021-10-23 ENCOUNTER — Telehealth (HOSPITAL_COMMUNITY): Payer: Self-pay

## 2021-10-23 NOTE — BH Assessment (Signed)
Care Management - [facility]UC Follow Up Discharges   Writer attempted to make contact with patient today and was unsuccessful.  Writer left a HIPPA compliant voice message.   Per chart review, patient completed a follow up appointment at [facility] on 10-07-21 with NP, Toy Cookey

## 2021-10-26 ENCOUNTER — Telehealth: Payer: Self-pay | Admitting: Psychiatry

## 2021-10-26 ENCOUNTER — Ambulatory Visit (INDEPENDENT_AMBULATORY_CARE_PROVIDER_SITE_OTHER): Payer: Medicaid Other | Admitting: Psychiatry

## 2021-10-26 ENCOUNTER — Encounter: Payer: Self-pay | Admitting: Psychiatry

## 2021-10-26 VITALS — BP 133/89 | HR 74 | Ht 66.0 in | Wt 262.0 lb

## 2021-10-26 DIAGNOSIS — R519 Headache, unspecified: Secondary | ICD-10-CM

## 2021-10-26 DIAGNOSIS — R29818 Other symptoms and signs involving the nervous system: Secondary | ICD-10-CM | POA: Diagnosis not present

## 2021-10-26 DIAGNOSIS — G43009 Migraine without aura, not intractable, without status migrainosus: Secondary | ICD-10-CM | POA: Diagnosis not present

## 2021-10-26 MED ORDER — PROPRANOLOL HCL 20 MG PO TABS: 20.0000 mg | ORAL_TABLET | Freq: Two times a day (BID) | ORAL | 6 refills | Status: DC

## 2021-10-26 MED ORDER — RIZATRIPTAN BENZOATE 5 MG PO TABS: 5.0000 mg | ORAL_TABLET | ORAL | 6 refills | Status: DC | PRN

## 2021-10-26 NOTE — Patient Instructions (Signed)
MRI brain Start rizatriptan as needed for migraines. Take at the onset of migraine. If headache recurs or does not fully resolve, you may take a second dose after 2 hours. Please avoid taking more than 2 days per week to avoid rebound headaches Start propranolol 20 mg twice a day for headache prevention

## 2021-10-26 NOTE — Progress Notes (Signed)
Referring:  Center, [facility] [address] [place],  [place] [address]  PCP: Center, [facility] was asked to evaluate Miranda Robertson, a 31 year old female for a chief complaint of headaches.  Our recommendations of care will be communicated by shared medical record.    CC:  headaches  History provided from self  HPI:  Medical co-morbidities: bipolar 1 disorder, ADHD, migraines, mild asthma (rarely uses inhaler)  The patient presents for evaluation of headaches which began 4 years ago but have worsened over the past 6 months. No clear triggers for the worsening of her headaches. Currently she has headaches every day. They are described as frontal pressure and throbbing with associated photophobia, phonophobia, and nausea. They can last for several hours at a time without medication. She currently takes Excedrin and [facility] as needed for headaches. Notes that she does have an irregular eating schedule which she thinks may be contributing.  [facility] 09/02/21 was unremarkable  Headache History: Onset: 4 years ago Triggers: fasting Aura: no Location: frontal Quality/Description: pressure, throbbing Associated Symptoms:  Photophobia: yes  Phonophobia: yes  Nausea: yes Other symptoms: irritable Worse with activity?: yes Duration of headaches: several hours without Excedrin, 30-45 minutes with Excedrin   Headache days per month: 30 Headache free days per month: 0  Current Treatment: Abortive Excedrin  Preventative none  Prior Therapies                                 Lamictal 100 mg BID Fluoxetine 20 mg daily Excedrin  LABS: CBC    Component Value Date/Time   WBC 9.7 12/09/2017 1146   WBC 16.9 (H) 06/10/2015 0421   RBC 5.07 12/09/2017 1146   RBC 4.79 06/10/2015 0421   HGB 13.7 12/09/2017 1146   HGB 32.5 03/07/2014 0000   HCT 40.5 12/09/2017 1146   HCT 11 03/07/2014 0000   PLT 355 12/09/2017 1146   PLT 340 03/07/2014 0000   MCV 80  12/09/2017 1146   MCH 27.0 12/09/2017 1146   MCH 25.9 (L) 06/10/2015 0421   MCHC 33.8 12/09/2017 1146   MCHC 32.4 06/10/2015 0421   RDW 14.5 12/09/2017 1146      Latest Ref Rng & Units 12/09/2017   11:46 AM 07/23/2016   11:12 AM 06/10/2015    4:21 AM  CMP  Glucose 65 - 99 mg/dL 95  92  500   BUN 6 - 20 mg/dL 8  10  12    Creatinine 0.57 - 1.00 mg/dL  9.38  1.82   Sodium 134 - 144 mmol/L 137  139  139   Potassium 3.5 - 5.2 mmol/L 4.3  4.4  3.7   Chloride 96 - 106 mmol/L 101  106  110   CO2 20 - 29 mmol/L 23  19  21    Calcium 8.7 - 10.2 mg/dL 9.5  9.6  9.3   Total Protein 6.0 - 8.5 g/dL 7.3  7.1  7.7   Total Bilirubin 0.0 - 1.2 mg/dL 0.3  0.2  0.4   Alkaline Phos 39 - 117 IU/L 112  81  72   AST 0 - 40 IU/L 31  14  20    ALT 0 - 32 IU/L 36  17  17      IMAGING:  [facility] 09/02/21: unremarkable  Imaging independently reviewed on October 26, 2021   Current Outpatient Medications on File Prior to Visit  Medication Sig Dispense Refill   diphenhydrAMINE HCl, Sleep, (UNISOM SLEEPMELTS) 25 MG TBDP Take 1 tablet (25 mg total) by mouth at bedtime. 30 tablet 3   FLUoxetine (PROZAC) 20 MG capsule Take 1 capsule (20 mg total) by mouth daily. 30 capsule 3   HYDROcodone-acetaminophen (NORCO/VICODIN) 5-325 MG tablet Take 1-2 tablets by mouth every 6 (six) hours as needed. 8 tablet 0   HYDROcodone-acetaminophen (NORCO/VICODIN) 5-325 MG tablet Take 1 tablet by mouth every 6 (six) hours as needed for up to 15 doses. 15 tablet 0   ibuprofen (ADVIL) 600 MG tablet Take 1 tablet (600 mg total) by mouth every 6 (six) hours as needed for up to 30 doses for moderate pain or mild pain. 30 tablet 0   lamoTRIgine (LAMICTAL) 100 MG tablet Take 1 tablet (100 mg total) by mouth 2 (two) times daily. 60 tablet 3   lisdexamfetamine (VYVANSE) 20 MG capsule TAKE ONE CAPSULE BY MOUTH DAILY IN THE MORNING 30 capsule 0   lurasidone (LATUDA) 80 MG TABS tablet Take 1 tablet (80 mg total) by mouth daily with breakfast. 30  tablet 3   melatonin 1 MG TABS tablet Take 2 tablets (2 mg total) by mouth at bedtime. 60 tablet 3   etonogestrel-ethinyl estradiol (NUVARING) 0.12-0.015 MG/24HR vaginal ring Insert vaginally and leave in place for 3 consecutive weeks, then remove for 1 week. (Patient not taking: Reported on 10/26/2021) 1 each 12   No current facility-administered medications on file prior to visit.     Allergies: No Known Allergies  Family History: Migraine or other headaches in the family:  sister, father Aneurysms in a first degree relative:  no Brain tumors in the family:  no Other neurological illness in the family:   no  Past Medical History: Past Medical History:  Diagnosis Date   Asthma    Childhood   Depression    HSV (herpes simplex virus) infection    Mental disorder    bipolar, depression, mild menatl retardation, suicidal thoughts in 2010    Past Surgical History Past Surgical History:  Procedure Laterality Date   CESAREAN SECTION  06/09/12   FTP   CESAREAN SECTION N/A 05/21/2014   Procedure: CESAREAN SECTION;  Surgeon: Donnamae Jude, MD;  Location: [facility];  Service: Obstetrics;  Laterality: N/A;   CHOLECYSTECTOMY     WISDOM TOOTH EXTRACTION      Social History: Social History   Tobacco Use   Smoking status: Every Day    Types: Cigarettes   Smokeless tobacco: Never  Vaping Use   Vaping Use: Never used  Substance Use Topics   Alcohol use: Yes    Comment: occassonal   Drug use: Yes    Types: Marijuana     ROS: Negative for fevers, chills. Positive for headaches. All other systems reviewed and negative unless stated otherwise in HPI.   Physical Exam:   Vital Signs: BP 133/89   Pulse 74   Ht 5\' 6"  (1.676 m)   Wt 262 lb (118.8 kg)   BMI 42.29 kg/m  GENERAL: well appearing,in no acute distress,alert SKIN:  Color, texture, turgor normal. No rashes or lesions HEAD:  Normocephalic/atraumatic. CV:  RRR RESP: Normal respiratory effort MSK: +tenderness to  palpation over bilateral neck and occiput  NEUROLOGICAL: Mental Status: Alert, oriented to person, place and time,Follows commands Cranial Nerves: PERRL, visual fields intact to confrontation, extraocular movements intact, decreased sensation over right V1-3,  no facial droop or ptosis, hearing grossly intact, no dysarthria, palate  elevate symmetrically Motor: muscle strength 5/5 both upper and lower extremities,no drift, normal tone Reflexes: 2+ throughout Sensation: decreased sensation to light touch over RLE, otherwise extremities intact to light touch Coordination: Finger-to- nose-finger intact bilaterally Gait: normal-based   IMPRESSION: 31 year old female with a history of bipolar 1 disorder, ADHD, mild asthma who presents for evaluation of headaches. Will order MRI brain and exam reveals decreased sensation over her right face and leg. Her headache pattern is consistent with chronic migraine. She also may have a component of medication overuse headache in the setting of frequent Excedrin use. Counseled on limiting Excedrin to avoid rebound headaches. Preventive medication options are limited due to her comorbidities and interactions with her psychiatric medications. Will start low dose propranolol for headache prevention. Counseled that this may worsen asthma and to let office know if shortness of breath develops. Will start Maxalt for rescue.  PLAN: -MRI brain -Prevention: Start propranolol 20 mg BID -Rescue: Start Maxalt 5 mg PRN -Counseled on limiting Excedrin use to avoid rebound headaches -next steps: consider Topamax, neck PT   I spent a total of 26 minutes chart reviewing and counseling the patient. Headache education was done. Discussed treatment options including preventive and acute medications. Discussed medication overuse headache and to limit use of acute treatments to no more than 2 days/week or 10 days/month. Discussed medication side effects, adverse reactions and drug  interactions. Written educational materials and patient instructions outlining all of the above were given.  Follow-up: 6 months   Ocie Doyne, MD 10/26/2021   12:05 PM

## 2021-10-26 NOTE — Telephone Encounter (Signed)
[place] medicaid NPR sent to GI 

## 2021-11-06 ENCOUNTER — Other Ambulatory Visit (HOSPITAL_COMMUNITY): Payer: Self-pay | Admitting: Psychiatry

## 2021-11-06 DIAGNOSIS — F900 Attention-deficit hyperactivity disorder, predominantly inattentive type: Secondary | ICD-10-CM

## 2021-11-07 ENCOUNTER — Inpatient Hospital Stay: Admission: RE | Admit: 2021-11-07 | Payer: Self-pay | Source: Ambulatory Visit

## 2021-11-12 NOTE — Telephone Encounter (Signed)
Received request for refill of Vyvanse.  This was sent.   Sent: -Vyvanse 20 mg daily. 30 tablets with 0 refills.    Arna Snipe MD Resident

## 2021-11-16 ENCOUNTER — Telehealth (HOSPITAL_COMMUNITY): Payer: Self-pay | Admitting: *Deleted

## 2021-11-16 DIAGNOSIS — F900 Attention-deficit hyperactivity disorder, predominantly inattentive type: Secondary | ICD-10-CM

## 2021-11-16 NOTE — Telephone Encounter (Signed)
Patient Called Requested Refill -- isdexamfetamine (VYVANSE) 20 MG capsule TAKE ONE CAPSULE BY MOUTH DAILY IN THE MORNING

## 2021-11-17 NOTE — Addendum Note (Signed)
Addended by: Lauro Franklin on: 11/17/2021 07:09 AM   Modules accepted: Orders

## 2021-11-17 NOTE — Telephone Encounter (Signed)
Received message stating patient needed refill of their Vyvanse.  Per PDMP Vyvanse filled at [facility] on 7/27.  Will not send a refill at this time.    Arna Snipe MD Resident

## 2021-11-19 ENCOUNTER — Ambulatory Visit
Admission: RE | Admit: 2021-11-19 | Discharge: 2021-11-19 | Disposition: A | Discharge status: Home / Self Care | Payer: Medicaid Other | Source: Ambulatory Visit | Attending: Psychiatry | Admitting: Psychiatry

## 2021-11-19 DIAGNOSIS — R519 Headache, unspecified: Secondary | ICD-10-CM

## 2021-11-19 DIAGNOSIS — R29818 Other symptoms and signs involving the nervous system: Secondary | ICD-10-CM

## 2021-11-19 MED ORDER — GADOBENATE DIMEGLUMINE 529 MG/ML IV SOLN
20.0000 mL | Freq: Once | INTRAVENOUS | Status: AC | PRN
  Administered 2021-11-19: 20 mL via INTRAVENOUS

## 2021-12-02 ENCOUNTER — Telehealth: Payer: Self-pay | Admitting: Psychiatry

## 2021-12-02 NOTE — Telephone Encounter (Signed)
Pt is calling and requesting someone call and go over MRI results.

## 2021-12-02 NOTE — Telephone Encounter (Signed)
Called pt and discussed MRI results with her. She understood and appreciated the call. I verbalized for her to call back if she had questions or concerns.

## 2021-12-17 ENCOUNTER — Telehealth (HOSPITAL_COMMUNITY): Payer: Self-pay | Admitting: *Deleted

## 2021-12-17 ENCOUNTER — Other Ambulatory Visit (HOSPITAL_COMMUNITY): Payer: Self-pay | Admitting: Psychiatry

## 2021-12-17 DIAGNOSIS — F900 Attention-deficit hyperactivity disorder, predominantly inattentive type: Secondary | ICD-10-CM

## 2021-12-17 MED ORDER — LISDEXAMFETAMINE DIMESYLATE 20 MG PO CAPS: ORAL_CAPSULE | ORAL | 0 refills | Status: DC

## 2021-12-17 NOTE — Telephone Encounter (Signed)
Call from patient asking for a PA for her MCD to be done on her medicine. Called her pharmacy because I have not gotten a notice from [employer] for a PA. I spoke with pharmacy tech, its not a PA needed its a new rx. She should be out tomorrow per chart and has an appt on 01/11/22 with Brittney DNP for her next appt. WIll notify Dr Doyne Keel of the need to send in a new rx sent in to [facility] for her Vyvanse.

## 2021-12-17 NOTE — Telephone Encounter (Signed)
Medication refilled and sent to preferred pharmacy

## 2021-12-21 ENCOUNTER — Ambulatory Visit (HOSPITAL_COMMUNITY)
Admission: EM | Admit: 2021-12-21 | Discharge: 2021-12-21 | Disposition: A | Discharge status: Home / Self Care | Payer: Medicaid Other | Attending: Physician Assistant | Admitting: Physician Assistant

## 2021-12-21 ENCOUNTER — Encounter (HOSPITAL_COMMUNITY): Payer: Self-pay

## 2021-12-21 DIAGNOSIS — J019 Acute sinusitis, unspecified: Secondary | ICD-10-CM

## 2021-12-21 DIAGNOSIS — J45901 Unspecified asthma with (acute) exacerbation: Secondary | ICD-10-CM

## 2021-12-21 MED ORDER — ALBUTEROL SULFATE HFA 108 (90 BASE) MCG/ACT IN AERS: 1.0000 | INHALATION_SPRAY | Freq: Four times a day (QID) | RESPIRATORY_TRACT | 0 refills | Status: DC | PRN

## 2021-12-21 MED ORDER — AMOXICILLIN-POT CLAVULANATE 875-125 MG PO TABS: 1.0000 | ORAL_TABLET | Freq: Two times a day (BID) | ORAL | 0 refills | Status: DC

## 2021-12-21 MED ORDER — METHYLPREDNISOLONE SODIUM SUCC 125 MG IJ SOLR
80.0000 mg | Freq: Once | INTRAMUSCULAR | Status: AC
  Administered 2021-12-21: 80 mg via INTRAMUSCULAR

## 2021-12-21 MED ORDER — METHYLPREDNISOLONE SODIUM SUCC 125 MG IJ SOLR
INTRAMUSCULAR | Status: AC
  Filled 2021-12-21: qty 2

## 2021-12-21 NOTE — Discharge Instructions (Signed)
Take antibiotic as prescribed Use inhaler as needed for wheezing, shortness of breath Drink plenty of fluids, rest.  Return if symptoms become worse.

## 2021-12-21 NOTE — ED Provider Notes (Signed)
[facility]    CSN: [id number] Arrival date & time: 12/21/21  1314      History   Chief Complaint Chief Complaint  Patient presents with   Cough   Nasal Congestion    HPI Miranda Robertson is a 31 y.o. female.   Pt complains of sinus pressure/pain, congestion, and cough that started about 2 weeks ago.  Reports h/o asthma.  States she has experienced occasional wheezing, needs a refill of albuterol inhaler.  She has been taking sudafed and otc cough syrup with temporary improvement.  She denies fever, chills, body aches, shortness of breath.  She request a steroid shot in clinic today.     Past Medical History:  Diagnosis Date   Asthma    Childhood   Depression    HSV (herpes simplex virus) infection    Mental disorder    bipolar, depression, mild menatl retardation, suicidal thoughts in 2010    Patient Active Problem List   Diagnosis Date Noted   Bipolar 1 disorder, mixed, partial remission (HCC) 04/28/2021   Bipolar I disorder (HCC) 11/27/2019   Attention deficit hyperactivity disorder (ADHD), 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   Previous cesarean section 05/22/2014   Gestational hypertension without significant proteinuria, antepartum 05/16/2014   Gestational hypertension    H/O cesarean section complicating pregnancy 05/06/2014   Pregnant 05/06/2014   Drug use affecting pregnancy in third trimester 04/30/2014   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   Lost custody of children 04/30/2014   Previous cesarean delivery, antepartum 04/30/2014    Past Surgical History:  Procedure Laterality Date   CESAREAN SECTION  06/09/12   FTP   CESAREAN SECTION N/A 05/21/2014   Procedure: CESAREAN SECTION;  Surgeon: Reva Bores, MD;  Location: [facility];  Service: Obstetrics;  Laterality: N/A;   CHOLECYSTECTOMY     WISDOM TOOTH EXTRACTION      OB History     Gravida  2   Para  2   Term  2   Preterm      AB      Living  2      SAB       IAB      Ectopic      Multiple  0   Live Births  2            Home Medications    Prior to Admission medications   Medication Sig Start Date End Date Taking? Authorizing Provider  albuterol (VENTOLIN HFA) 108 (90 Base) MCG/ACT inhaler Inhale 1-2 puffs into the lungs every 6 (six) hours as needed for wheezing or shortness of breath. 12/21/21  Yes Ward, Tylene Fantasia, PA-C  amoxicillin-clavulanate (AUGMENTIN) 875-125 MG tablet Take 1 tablet by mouth every 12 (twelve) hours. 12/21/21  Yes Ward, Tylene Fantasia, PA-C  diphenhydrAMINE HCl, Sleep, (UNISOM SLEEPMELTS) 25 MG TBDP Take 1 tablet (25 mg total) by mouth at bedtime. 10/07/21   Shanna Cisco, NP  etonogestrel-ethinyl estradiol (NUVARING) 0.12-0.015 MG/24HR vaginal ring Insert vaginally and leave in place for 3 consecutive weeks, then remove for 1 week. Patient not taking: Reported on 10/26/2021 02/19/20   Currie Paris, NP  FLUoxetine (PROZAC) 20 MG capsule Take 1 capsule (20 mg total) by mouth daily. 10/07/21   Shanna Cisco, NP  HYDROcodone-acetaminophen (NORCO/VICODIN) 5-325 MG tablet Take 1-2 tablets by mouth every 6 (six) hours as needed. 02/26/20   Petrucelli, Samantha R, PA-C  HYDROcodone-acetaminophen (NORCO/VICODIN) 5-325 MG tablet Take 1 tablet by mouth  every 6 (six) hours as needed for up to 15 doses. 07/17/21   Wyvonnia Dusky, MD  ibuprofen (ADVIL) 600 MG tablet Take 1 tablet (600 mg total) by mouth every 6 (six) hours as needed for up to 30 doses for moderate pain or mild pain. 07/17/21   Wyvonnia Dusky, MD  lamoTRIgine (LAMICTAL) 100 MG tablet Take 1 tablet (100 mg total) by mouth 2 (two) times daily. 10/07/21   Salley Slaughter, NP  lisdexamfetamine (VYVANSE) 20 MG capsule TAKE ONE CAPSULE BY MOUTH DAILY IN THE MORNING 12/17/21   Salley Slaughter, NP  lurasidone (LATUDA) 80 MG TABS tablet Take 1 tablet (80 mg total) by mouth daily with breakfast. 10/07/21   Eulis Canner E, NP  melatonin 1 MG TABS tablet Take 2  tablets (2 mg total) by mouth at bedtime. 10/07/21   Salley Slaughter, NP  propranolol (INDERAL) 20 MG tablet Take 1 tablet (20 mg total) by mouth 2 (two) times daily. 10/26/21   Genia Harold, MD  rizatriptan (MAXALT) 5 MG tablet Take 1 tablet (5 mg total) by mouth as needed for migraine. May repeat in 2 hours if needed 10/26/21   Genia Harold, MD    Family History Family History  Problem Relation Age of Onset   Headache Father    Headache Sister     Social History Social History   Tobacco Use   Smoking status: Every Day    Types: Cigarettes   Smokeless tobacco: Never  Vaping Use   Vaping Use: Never used  Substance Use Topics   Alcohol use: Yes    Comment: occassonal   Drug use: Yes    Types: Marijuana     Allergies   Patient has no known allergies.   Review of Systems Review of Systems  Constitutional:  Negative for chills and fever.  HENT:  Positive for congestion, sinus pressure and sinus pain. Negative for ear pain and sore throat.   Eyes:  Negative for pain and visual disturbance.  Respiratory:  Positive for cough. Negative for shortness of breath.   Cardiovascular:  Negative for chest pain and palpitations.  Gastrointestinal:  Negative for abdominal pain and vomiting.  Genitourinary:  Negative for dysuria and hematuria.  Musculoskeletal:  Negative for arthralgias and back pain.  Skin:  Negative for color change and rash.  Neurological:  Negative for seizures and syncope.  All other systems reviewed and are negative.    Physical Exam Triage Vital Signs ED Triage Vitals [12/21/21 1418]  Enc Vitals Group     BP 136/89     Pulse Rate 86     Resp 18     Temp 97.8 F (36.6 C)     Temp Source Oral     SpO2 98 %     Weight      Height      Head Circumference      Peak Flow      Pain Score      Pain Loc      Pain Edu?      Excl. in [place]?    No data found.  Updated Vital Signs BP 136/89 (BP Location: Left Arm)   Pulse 86   Temp 97.8 F (36.6  C) (Oral)   Resp 18   SpO2 98%   Visual Acuity Right Eye Distance:   Left Eye Distance:   Bilateral Distance:    Right Eye Near:   Left Eye Near:    Bilateral Near:  Physical Exam Vitals and nursing note reviewed.  Constitutional:      General: She is not in acute distress.    Appearance: She is well-developed.  HENT:     Head: Normocephalic and atraumatic.  Eyes:     Conjunctiva/sclera: Conjunctivae normal.  Cardiovascular:     Rate and Rhythm: Normal rate and regular rhythm.     Heart sounds: No murmur heard. Pulmonary:     Effort: Pulmonary effort is normal. No respiratory distress.     Breath sounds: Examination of the left-upper field reveals wheezing. Wheezing present.  Abdominal:     Palpations: Abdomen is soft.     Tenderness: There is no abdominal tenderness.  Musculoskeletal:        General: No swelling.     Cervical back: Neck supple.  Skin:    General: Skin is warm and dry.     Capillary Refill: Capillary refill takes less than 2 seconds.  Neurological:     Mental Status: She is alert.  Psychiatric:        Mood and Affect: Mood normal.      UC Treatments / Results  Labs (all labs ordered are listed, but only abnormal results are displayed) Labs Reviewed - No data to display  EKG   Radiology No results found.  Procedures Procedures (including critical care time)  Medications Ordered in UC Medications  methylPREDNISolone sodium succinate (SOLU-MEDROL) 125 mg/2 mL injection 80 mg (has no administration in time range)    Initial Impression / Assessment and Plan / UC Course  I have reviewed the triage vital signs and the nursing notes.  Pertinent labs & imaging results that were available during my care of the patient were reviewed by me and considered in my medical decision making (see chart for details).     Acute sinusitis.  Antibiotic prescribed.  Albuterol inhaler refill given.  Vitals wnl, pt non-toxic, stable for discharge.  Return precautions discussed.  Final Clinical Impressions(s) / UC Diagnoses   Final diagnoses:  Acute sinusitis, recurrence not specified, unspecified location  Mild asthma with acute exacerbation, unspecified whether persistent     Discharge Instructions      Take antibiotic as prescribed Use inhaler as needed for wheezing, shortness of breath Drink plenty of fluids, rest.  Return if symptoms become worse.    ED Prescriptions     Medication Sig Dispense Auth. Provider   amoxicillin-clavulanate (AUGMENTIN) 875-125 MG tablet Take 1 tablet by mouth every 12 (twelve) hours. 14 tablet Ward, Tylene Fantasia, PA-C   albuterol (VENTOLIN HFA) 108 (90 Base) MCG/ACT inhaler Inhale 1-2 puffs into the lungs every 6 (six) hours as needed for wheezing or shortness of breath. 1 each Ward, Tylene Fantasia, PA-C      PDMP not reviewed this encounter.   Ward, Tylene Fantasia, PA-C 12/21/21 1455

## 2021-12-21 NOTE — ED Triage Notes (Signed)
Pt reports sinus congestion and pressure x 2-3 weeks.  Pt reports sore throat and headache.  Pt is using OTC medication with no  relief.

## 2022-01-11 ENCOUNTER — Other Ambulatory Visit (HOSPITAL_COMMUNITY): Payer: Self-pay | Admitting: Psychiatry

## 2022-01-11 ENCOUNTER — Encounter (HOSPITAL_COMMUNITY): Payer: Self-pay | Admitting: Psychiatry

## 2022-01-11 ENCOUNTER — Telehealth (INDEPENDENT_AMBULATORY_CARE_PROVIDER_SITE_OTHER): Payer: Medicaid Other | Admitting: Psychiatry

## 2022-01-11 DIAGNOSIS — F411 Generalized anxiety disorder: Secondary | ICD-10-CM | POA: Diagnosis not present

## 2022-01-11 DIAGNOSIS — F900 Attention-deficit hyperactivity disorder, predominantly inattentive type: Secondary | ICD-10-CM | POA: Diagnosis not present

## 2022-01-11 DIAGNOSIS — F319 Bipolar disorder, unspecified: Secondary | ICD-10-CM

## 2022-01-11 MED ORDER — PROPRANOLOL HCL 20 MG PO TABS: 20.0000 mg | ORAL_TABLET | Freq: Two times a day (BID) | ORAL | 6 refills | Status: DC

## 2022-01-11 MED ORDER — LURASIDONE HCL 80 MG PO TABS: 80.0000 mg | ORAL_TABLET | Freq: Every day | ORAL | 3 refills | Status: DC

## 2022-01-11 MED ORDER — LAMOTRIGINE 100 MG PO TABS: 100.0000 mg | ORAL_TABLET | Freq: Two times a day (BID) | ORAL | 3 refills | Status: DC

## 2022-01-11 MED ORDER — FLUOXETINE HCL 20 MG PO CAPS: 20.0000 mg | ORAL_CAPSULE | Freq: Every day | ORAL | 3 refills | Status: DC

## 2022-01-11 MED ORDER — LISDEXAMFETAMINE DIMESYLATE 20 MG PO CAPS: ORAL_CAPSULE | ORAL | 0 refills | Status: DC

## 2022-01-11 MED ORDER — UNISOM SLEEPMELTS 25 MG PO TBDP: 25.0000 mg | ORAL_TABLET | Freq: Every evening | ORAL | 3 refills | Status: DC
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
## 2022-02-01 ENCOUNTER — Encounter (HOSPITAL_BASED_OUTPATIENT_CLINIC_OR_DEPARTMENT_OTHER): Payer: Self-pay

## 2022-02-01 ENCOUNTER — Emergency Department (HOSPITAL_BASED_OUTPATIENT_CLINIC_OR_DEPARTMENT_OTHER)
Admission: EM | Admit: 2022-02-01 | Discharge: 2022-02-01 | Discharge status: Against Medical Advice | Payer: Medicaid Other | Attending: Emergency Medicine | Admitting: Emergency Medicine

## 2022-02-01 DIAGNOSIS — Z5321 Procedure and treatment not carried out due to patient leaving prior to being seen by health care provider: Secondary | ICD-10-CM | POA: Diagnosis not present

## 2022-02-01 DIAGNOSIS — R079 Chest pain, unspecified: Secondary | ICD-10-CM | POA: Insufficient documentation

## 2022-02-01 DIAGNOSIS — R0602 Shortness of breath: Secondary | ICD-10-CM | POA: Insufficient documentation

## 2022-02-01 DIAGNOSIS — J45909 Unspecified asthma, uncomplicated: Secondary | ICD-10-CM | POA: Insufficient documentation

## 2022-02-01 LAB — CBC WITH DIFFERENTIAL/PLATELET
Abs Immature Granulocytes: 0.04 10*3/uL (ref 0.00–0.07)
Basophils Absolute: 0 10*3/uL (ref 0.0–0.1)
Basophils Relative: 0 %
Eosinophils Absolute: 0.1 10*3/uL (ref 0.0–0.5)
Eosinophils Relative: 1 %
HCT: 37.2 % (ref 36.0–46.0)
Hemoglobin: 12.1 g/dL (ref 12.0–15.0)
Immature Granulocytes: 0 %
Lymphocytes Relative: 19 %
Lymphs Abs: 1.9 10*3/uL (ref 0.7–4.0)
MCH: 27.9 pg (ref 26.0–34.0)
MCHC: 32.5 g/dL (ref 30.0–36.0)
MCV: 85.7 fL (ref 80.0–100.0)
Monocytes Absolute: 0.5 10*3/uL (ref 0.1–1.0)
Monocytes Relative: 5 %
Neutro Abs: 7.5 10*3/uL (ref 1.7–7.7)
Neutrophils Relative %: 75 %
Platelets: 326 10*3/uL (ref 150–400)
RBC: 4.34 MIL/uL (ref 3.87–5.11)
RDW: 13.3 % (ref 11.5–15.5)
WBC: 10.1 10*3/uL (ref 4.0–10.5)
nRBC: 0 % (ref 0.0–0.2)

## 2022-02-01 LAB — COMPREHENSIVE METABOLIC PANEL
ALT: 20 U/L (ref 0–44)
AST: 15 U/L (ref 15–41)
Albumin: 4.2 g/dL (ref 3.5–5.0)
Alkaline Phosphatase: 80 U/L (ref 38–126)
Anion gap: 8 (ref 5–15)
BUN: 7 mg/dL (ref 6–20)
CO2: 26 mmol/L (ref 22–32)
Calcium: 9.6 mg/dL (ref 8.9–10.3)
Chloride: 106 mmol/L (ref 98–111)
Creatinine, Ser: 0.63 mg/dL (ref 0.44–1.00)
GFR, Estimated: >60 mL/min (ref >60)
Glucose, Bld: 88 mg/dL (ref 70–99)
Potassium: 3.8 mmol/L (ref 3.5–5.1)
Sodium: 140 mmol/L (ref 135–145)
Total Bilirubin: 0.3 mg/dL (ref 0.3–1.2)
Total Protein: 7.2 g/dL (ref 6.5–8.1)

## 2022-02-01 LAB — TROPONIN I (HIGH SENSITIVITY): Troponin I (High Sensitivity): <2 ng/L (ref <18)

## 2022-02-01 NOTE — ED Triage Notes (Addendum)
Pt c/o CP and SHOB that started last Tuesday, worse today Hx of asthma Home covid test -

## 2022-02-01 NOTE — ED Notes (Signed)
Patient Called several times for Examination Room and EDP Assessment. No Response and not visualized in Waiting Areas. To be discharged accordingly.

## 2022-03-18 ENCOUNTER — Telehealth (HOSPITAL_COMMUNITY): Payer: Self-pay | Admitting: *Deleted

## 2022-03-18 ENCOUNTER — Other Ambulatory Visit (HOSPITAL_COMMUNITY): Payer: Self-pay | Admitting: Psychiatry

## 2022-03-18 DIAGNOSIS — F900 Attention-deficit hyperactivity disorder, predominantly inattentive type: Secondary | ICD-10-CM

## 2022-03-18 MED ORDER — LISDEXAMFETAMINE DIMESYLATE 20 MG PO CAPS: ORAL_CAPSULE | ORAL | 0 refills | Status: DC

## 2022-03-18 NOTE — Telephone Encounter (Signed)
Medication refilled and sent to preferred pharmacy

## 2022-03-18 NOTE — Telephone Encounter (Signed)
PATIENT REQUESTED REFILL-- lisdexamfetamine (VYVANSE) 20 MG capsule

## 2022-03-31 ENCOUNTER — Telehealth (HOSPITAL_COMMUNITY): Payer: Medicaid Other | Admitting: Student in an Organized Health Care Education/Training Program

## 2022-04-10 ENCOUNTER — Telehealth: Payer: Medicaid Other | Admitting: Nurse Practitioner

## 2022-04-10 DIAGNOSIS — R399 Unspecified symptoms and signs involving the genitourinary system: Secondary | ICD-10-CM | POA: Diagnosis not present

## 2022-04-11 MED ORDER — NITROFURANTOIN MONOHYD MACRO 100 MG PO CAPS: 100.0000 mg | ORAL_CAPSULE | Freq: Two times a day (BID) | ORAL | 0 refills | Status: DC

## 2022-04-11 NOTE — Progress Notes (Signed)
I have spent 5 minutes in review of e-visit questionnaire, review and updating patient chart, medical decision making and response to patient.  ° °Teonia Yager W [place] Trawick, NP ° °  °

## 2022-04-11 NOTE — Progress Notes (Signed)
In regard to your bleeding I would suggest you follow up with your PCP, GYN or local health department.  E-Visit for Urinary Problems  We are sorry that you are not feeling well.  Here is how we plan to help!  Based on what you shared with me it looks like you most likely have a simple urinary tract infection.  A UTI (Urinary Tract Infection) is a bacterial infection of the bladder.  Most cases of urinary tract infections are simple to treat but a key part of your care is to encourage you to drink plenty of fluids and watch your symptoms carefully.  I have prescribed MacroBid 100 mg twice a day for 5 days.  Your symptoms should gradually improve. Call us if the burning in your urine worsens, you develop worsening fever, back pain or pelvic pain or if your symptoms do not resolve after completing the antibiotic.  Urinary tract infections can be prevented by drinking plenty of water to keep your body hydrated.  Also be sure when you wipe, wipe from front to back and don't hold it in!  If possible, empty your bladder every 4 hours.  HOME CARE Drink plenty of fluids Compete the full course of the antibiotics even if the symptoms resolve Remember, when you need to go.go. Holding in your urine can increase the likelihood of getting a UTI! GET HELP RIGHT AWAY IF: You cannot urinate You get a high fever Worsening back pain occurs You see blood in your urine You feel sick to your stomach or throw up You feel like you are going to pass out  MAKE SURE YOU  Understand these instructions. Will watch your condition. Will get help right away if you are not doing well or get worse.   Thank you for choosing an e-visit.  Your e-visit answers were reviewed by a board certified advanced clinical practitioner to complete your personal care plan. Depending upon the condition, your plan could have included both over the counter or prescription medications.  Please review your pharmacy choice. Make  sure the pharmacy is open so you can pick up prescription now. If there is a problem, you may contact your provider through [employer] and have the prescription routed to another pharmacy.  Your safety is important to [place]. If you have drug allergies check your prescription carefully.   For the next 24 hours you can use MyChart to ask questions about today's visit, request a non-urgent call back, or ask for a work or school excuse. You will get an email in the next two days asking about your experience. I hope that your e-visit has been valuable and will speed your recovery.

## 2022-04-12 ENCOUNTER — Other Ambulatory Visit: Payer: Self-pay

## 2022-04-12 ENCOUNTER — Emergency Department (HOSPITAL_BASED_OUTPATIENT_CLINIC_OR_DEPARTMENT_OTHER)
Admission: EM | Admit: 2022-04-12 | Discharge: 2022-04-12 | Discharge status: Against Medical Advice | Payer: Medicaid Other | Attending: Emergency Medicine | Admitting: Emergency Medicine

## 2022-04-12 ENCOUNTER — Encounter (HOSPITAL_BASED_OUTPATIENT_CLINIC_OR_DEPARTMENT_OTHER): Payer: Self-pay

## 2022-04-12 DIAGNOSIS — Z5321 Procedure and treatment not carried out due to patient leaving prior to being seen by health care provider: Secondary | ICD-10-CM | POA: Insufficient documentation

## 2022-04-12 DIAGNOSIS — R109 Unspecified abdominal pain: Secondary | ICD-10-CM | POA: Insufficient documentation

## 2022-04-12 DIAGNOSIS — N939 Abnormal uterine and vaginal bleeding, unspecified: Secondary | ICD-10-CM | POA: Diagnosis present

## 2022-04-12 DIAGNOSIS — M549 Dorsalgia, unspecified: Secondary | ICD-10-CM | POA: Diagnosis not present

## 2022-04-12 DIAGNOSIS — R110 Nausea: Secondary | ICD-10-CM | POA: Insufficient documentation

## 2022-04-12 LAB — COMPREHENSIVE METABOLIC PANEL
ALT: 18 U/L (ref 0–44)
AST: 17 U/L (ref 15–41)
Albumin: 4.3 g/dL (ref 3.5–5.0)
Alkaline Phosphatase: 85 U/L (ref 38–126)
Anion gap: 12 (ref 5–15)
BUN: 11 mg/dL (ref 6–20)
CO2: 24 mmol/L (ref 22–32)
Calcium: 9.6 mg/dL (ref 8.9–10.3)
Chloride: 102 mmol/L (ref 98–111)
Creatinine, Ser: 0.69 mg/dL (ref 0.44–1.00)
GFR, Estimated: >60 mL/min (ref >60)
Glucose, Bld: 93 mg/dL (ref 70–99)
Potassium: 4.3 mmol/L (ref 3.5–5.1)
Sodium: 138 mmol/L (ref 135–145)
Total Bilirubin: 0.4 mg/dL (ref 0.3–1.2)
Total Protein: 7.6 g/dL (ref 6.5–8.1)

## 2022-04-12 LAB — CBC
HCT: 38.1 % (ref 36.0–46.0)
Hemoglobin: 12.3 g/dL (ref 12.0–15.0)
MCH: 27.3 pg (ref 26.0–34.0)
MCHC: 32.3 g/dL (ref 30.0–36.0)
MCV: 84.5 fL (ref 80.0–100.0)
Platelets: 311 10*3/uL (ref 150–400)
RBC: 4.51 MIL/uL (ref 3.87–5.11)
RDW: 13.8 % (ref 11.5–15.5)
WBC: 12.8 10*3/uL — ABNORMAL HIGH (ref 4.0–10.5)
nRBC: 0 % (ref 0.0–0.2)

## 2022-04-12 LAB — LIPASE, BLOOD: Lipase: 30 U/L (ref 11–51)

## 2022-04-12 NOTE — ED Triage Notes (Signed)
Called x3 in the lobby. No answer

## 2022-04-12 NOTE — ED Triage Notes (Signed)
Pt c/o back pain, along with abd pain and nausea, worsening yesterday. Pt states she has had vaginal bleeding with large clots, but that has stopped.

## 2022-04-13 ENCOUNTER — Encounter (HOSPITAL_COMMUNITY): Payer: Self-pay

## 2022-04-13 ENCOUNTER — Ambulatory Visit (HOSPITAL_COMMUNITY)
Admission: EM | Admit: 2022-04-13 | Discharge: 2022-04-13 | Disposition: A | Discharge status: Home / Self Care | Payer: Medicaid Other | Attending: Internal Medicine | Admitting: Internal Medicine

## 2022-04-13 VITALS — BP 110/71 | HR 75 | Temp 98.3°F | Resp 16

## 2022-04-13 DIAGNOSIS — R1030 Lower abdominal pain, unspecified: Secondary | ICD-10-CM | POA: Insufficient documentation

## 2022-04-13 DIAGNOSIS — N939 Abnormal uterine and vaginal bleeding, unspecified: Secondary | ICD-10-CM | POA: Diagnosis present

## 2022-04-13 LAB — COMPREHENSIVE METABOLIC PANEL
ALT: 22 U/L (ref 0–44)
AST: 21 U/L (ref 15–41)
Albumin: 3.7 g/dL (ref 3.5–5.0)
Alkaline Phosphatase: 91 U/L (ref 38–126)
Anion gap: 9 (ref 5–15)
BUN: 9 mg/dL (ref 6–20)
CO2: 24 mmol/L (ref 22–32)
Calcium: 9.3 mg/dL (ref 8.9–10.3)
Chloride: 107 mmol/L (ref 98–111)
Creatinine, Ser: 0.59 mg/dL (ref 0.44–1.00)
GFR, Estimated: >60 mL/min (ref >60)
Glucose, Bld: 100 mg/dL — ABNORMAL HIGH (ref 70–99)
Potassium: 4 mmol/L (ref 3.5–5.1)
Sodium: 140 mmol/L (ref 135–145)
Total Bilirubin: 0.4 mg/dL (ref 0.3–1.2)
Total Protein: 7 g/dL (ref 6.5–8.1)

## 2022-04-13 LAB — CBC
HCT: 38.6 % (ref 36.0–46.0)
Hemoglobin: 12.2 g/dL (ref 12.0–15.0)
MCH: 26.9 pg (ref 26.0–34.0)
MCHC: 31.6 g/dL (ref 30.0–36.0)
MCV: 85.2 fL (ref 80.0–100.0)
Platelets: 315 10*3/uL (ref 150–400)
RBC: 4.53 MIL/uL (ref 3.87–5.11)
RDW: 13.6 % (ref 11.5–15.5)
WBC: 7.6 10*3/uL (ref 4.0–10.5)
nRBC: 0 % (ref 0.0–0.2)

## 2022-04-13 LAB — POC URINE PREG, ED: Preg Test, Ur: NEGATIVE

## 2022-04-13 LAB — POCT URINALYSIS DIPSTICK, ED / UC
Bilirubin Urine: NEGATIVE
Glucose, UA: NEGATIVE mg/dL
Hgb urine dipstick: NEGATIVE
Ketones, ur: NEGATIVE mg/dL
Leukocytes,Ua: NEGATIVE
Nitrite: NEGATIVE
Protein, ur: NEGATIVE mg/dL
Specific Gravity, Urine: 1.02 (ref 1.005–1.030)
Urobilinogen, UA: 0.2 mg/dL (ref 0.0–1.0)
pH: 6.5 (ref 5.0–8.0)

## 2022-04-13 LAB — TSH: TSH: 0.92 u[IU]/mL (ref 0.350–4.500)

## 2022-04-13 MED ORDER — IBUPROFEN 800 MG PO TABS: 800.0000 mg | ORAL_TABLET | Freq: Three times a day (TID) | ORAL | 0 refills | Status: DC

## 2022-04-13 NOTE — ED Triage Notes (Addendum)
Chief Complaint: abdominal pain, heavy bleeding with blood clots started back up last week. Bleeding stopped this past Friday. Now having back pain, abdominal pain and nausea. Was seen at ED yesterday and had blood work done. No new sexual partners however just ended a relationship with the father of her children.   Onset: 2 months ago onset of bleeding.   Prescriptions or OTC medications tried: Yes- pain medication     with mild relief  Sick exposure: No  New foods, medications, or products: No  Recent Travel: No

## 2022-04-13 NOTE — ED Provider Notes (Signed)
[facility]    CSN: [id number] Arrival date & time: 04/13/22  1230      History   Chief Complaint Chief Complaint  Patient presents with   Abdominal Pain    Bleeding with blood clots, back pain, abdominal pain nauseous - Entered by patient   Vaginal Bleeding    HPI Giannah Zavadil is a 31 y.o. female.   Patient presents today with a several week history of intermittent lower abdominal pain abnormal uterine bleeding.  She reports that for the past several months she has had a heavy menstrual cycle where she is having to change her personal hygiene products every few hours which last for between 3 and 5 days then resolves until she has a recurrence approximately 1 week later.  Prior to this she reports having normal menstrual cycles.  She is not currently on any hormonal contraception.  She reports lower abdominal pain that radiates to her back which is rated 8 on a 0-10 pain scale, described as aching, no aggravating leaving factors identified.  She denies any significant vaginal discharge or odor.  Denies any urinary symptoms.  She took an at home pregnancy test that was negative.  She was seen in the emergency room yesterday and had triage labs including normal CMP but did have mild leukocytosis with normal hemoglobin on CBC.  Imaging was not obtained.  She does have a OB/GYN but has not seen them recently and was unable to get an appointment until February 2024.    Past Medical History:  Diagnosis Date   Asthma    Childhood   Depression    HSV (herpes simplex virus) infection    Mental disorder    bipolar, depression, mild menatl retardation, suicidal thoughts in 2010    Patient Active Problem List   Diagnosis Date Noted   Bipolar 1 disorder, mixed, partial remission (HCC) 04/28/2021   Bipolar I disorder (HCC) 11/27/2019   Attention deficit hyperactivity disorder (ADHD), 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   Previous cesarean section 05/22/2014   Gestational  hypertension without significant proteinuria, antepartum 05/16/2014   Gestational hypertension    H/O cesarean section complicating pregnancy 05/06/2014   Pregnant 05/06/2014   Drug use affecting pregnancy in third trimester 04/30/2014   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   Lost custody of children 04/30/2014   Previous cesarean delivery, antepartum 04/30/2014    Past Surgical History:  Procedure Laterality Date   CESAREAN SECTION  06/09/12   FTP   CESAREAN SECTION N/A 05/21/2014   Procedure: CESAREAN SECTION;  Surgeon: Reva Bores, MD;  Location: [facility];  Service: Obstetrics;  Laterality: N/A;   CHOLECYSTECTOMY     WISDOM TOOTH EXTRACTION      OB History     Gravida  2   Para  2   Term  2   Preterm      AB      Living  2      SAB      IAB      Ectopic      Multiple  0   Live Births  2            Home Medications    Prior to Admission medications   Medication Sig Start Date End Date Taking? Authorizing Provider  diphenhydrAMINE HCl, Sleep, (UNISOM SLEEPMELTS) 25 MG TBDP Take 1-3 tablets (25-75 mg total) by mouth at bedtime. 01/11/22  Yes Toy Cookey E, NP  FLUoxetine (PROZAC) 20 MG capsule Take  1 capsule (20 mg total) by mouth daily. 01/11/22  Yes Eulis Canner E, NP  ibuprofen (ADVIL) 800 MG tablet Take 1 tablet (800 mg total) by mouth 3 (three) times daily. 04/13/22  Yes Yamilee Harmes, Derry Skill, PA-C  lamoTRIgine (LAMICTAL) 100 MG tablet Take 1 tablet (100 mg total) by mouth 2 (two) times daily. 01/11/22  Yes Eulis Canner E, NP  lisdexamfetamine (VYVANSE) 20 MG capsule TAKE ONE CAPSULE BY MOUTH DAILY IN THE MORNING 03/18/22  Yes Eulis Canner E, NP  lurasidone (LATUDA) 80 MG TABS tablet Take 1 tablet (80 mg total) by mouth daily with breakfast. 01/11/22  Yes Eulis Canner E, NP  albuterol (VENTOLIN HFA) 108 (90 Base) MCG/ACT inhaler Inhale 1-2 puffs into the lungs every 6 (six) hours as needed for wheezing or shortness of  breath. 12/21/21   Ward, Lenise Arena, PA-C  HYDROcodone-acetaminophen (NORCO/VICODIN) 5-325 MG tablet Take 1-2 tablets by mouth every 6 (six) hours as needed. 02/26/20   Petrucelli, Glynda Jaeger, PA-C  HYDROcodone-acetaminophen (NORCO/VICODIN) 5-325 MG tablet Take 1 tablet by mouth every 6 (six) hours as needed for up to 15 doses. 07/17/21   Wyvonnia Dusky, MD  propranolol (INDERAL) 20 MG tablet Take 1 tablet (20 mg total) by mouth 2 (two) times daily. 01/11/22   Salley Slaughter, NP  rizatriptan (MAXALT) 5 MG tablet Take 1 tablet (5 mg total) by mouth as needed for migraine. May repeat in 2 hours if needed 10/26/21   Genia Harold, MD    Family History Family History  Problem Relation Age of Onset   Headache Father    Headache Sister     Social History Social History   Tobacco Use   Smoking status: Former    Packs/day: 0.50    Types: Cigarettes    Quit date: 01/17/2018    Years since quitting: 4.2   Smokeless tobacco: Never  Vaping Use   Vaping Use: Never used  Substance Use Topics   Alcohol use: Yes    Comment: occassonal   Drug use: Yes    Types: Marijuana     Allergies   Patient has no known allergies.   Review of Systems Review of Systems  Constitutional:  Positive for activity change. Negative for appetite change, fatigue and fever.  Respiratory:  Negative for cough and shortness of breath.   Cardiovascular:  Negative for chest pain.  Gastrointestinal:  Positive for abdominal pain and nausea. Negative for diarrhea and vomiting.  Genitourinary:  Positive for vaginal bleeding and vaginal discharge. Negative for dysuria, frequency, pelvic pain, urgency and vaginal pain.     Physical Exam Triage Vital Signs ED Triage Vitals  Enc Vitals Group     BP 04/13/22 1257 110/71     Pulse Rate 04/13/22 1257 75     Resp 04/13/22 1257 16     Temp 04/13/22 1257 98.3 F (36.8 C)     Temp Source 04/13/22 1257 Oral     SpO2 04/13/22 1257 98 %     Weight --      Height --       Head Circumference --      Peak Flow --      Pain Score 04/13/22 1254 8     Pain Loc --      Pain Edu? --      Excl. in [place]? --    No data found.  Updated Vital Signs BP 110/71 (BP Location: Right Arm)   Pulse 75   Temp 98.3  F (36.8 C) (Oral)   Resp 16   LMP 04/11/2022 (Exact Date)   SpO2 98%   Visual Acuity Right Eye Distance:   Left Eye Distance:   Bilateral Distance:    Right Eye Near:   Left Eye Near:    Bilateral Near:     Physical Exam Vitals reviewed. Exam conducted with a chaperone present.  Constitutional:      General: She is awake. She is not in acute distress.    Appearance: Normal appearance. She is well-developed. She is not ill-appearing.     Comments: Very pleasant female appears stated age no acute process sitting comfortably in exam room  HENT:     Head: Normocephalic and atraumatic.  Cardiovascular:     Rate and Rhythm: Normal rate and regular rhythm.     Heart sounds: Normal heart sounds, S1 normal and S2 normal. No murmur heard. Pulmonary:     Effort: Pulmonary effort is normal.     Breath sounds: Normal breath sounds. No wheezing, rhonchi or rales.     Comments: Clear to auscultation bilaterally Abdominal:     General: Bowel sounds are normal.     Palpations: Abdomen is soft.     Tenderness: There is abdominal tenderness in the right lower quadrant, suprapubic area and left lower quadrant. There is no right CVA tenderness, left CVA tenderness, guarding or rebound. Negative signs include Rovsing's sign, McBurney's sign and psoas sign.  Genitourinary:    Labia:        Right: No rash or tenderness.        Left: No rash or tenderness.      Vagina: Vaginal discharge present. No bleeding.     Cervix: No cervical motion tenderness or cervical bleeding.     Uterus: Normal.      Adnexa: Right adnexa normal and left adnexa normal.       Right: No mass or tenderness.         Left: No mass or tenderness.       Comments: Maudie Mercury, RN present as  chaperone during exam Psychiatric:        Behavior: Behavior is cooperative.      UC Treatments / Results  Labs (all labs ordered are listed, but only abnormal results are displayed) Labs Reviewed  CBC  COMPREHENSIVE METABOLIC PANEL  TSH  POCT URINALYSIS DIPSTICK, ED / UC  POC URINE PREG, ED  CERVICOVAGINAL ANCILLARY ONLY    EKG   Radiology No results found.  Procedures Procedures (including critical care time)  Medications Ordered in UC Medications - No data to display  Initial Impression / Assessment and Plan / UC Course  I have reviewed the triage vital signs and the nursing notes.  Pertinent labs & imaging results that were available during my care of the patient were reviewed by me and considered in my medical decision making (see chart for details).     Patient is well-appearing, afebrile, nontoxic, nontachycardic.  No indication for emergent evaluation or imaging based on vital signs and physical exam today.  UA was obtained that was normal with no evidence of infection.  Urine pregnancy was obtained and was negative.  No CMT on exam concerning for pelvic inflammatory disease.  STI swab was collected and is pending.  She was started on ibuprofen 800 mg to be taken up to 3 times a day with instruction not to take additional NSAIDs with this medication due to risk of GI bleeding.  Discussed that ultimately she  will need to follow-up with a GYN.  She was given contact information for local providers since she has had difficulty scheduling an appointment with her OB/GYN in a reasonable amount of time.  CBC, CMP, TSH obtained to investigate abnormal bleeding-results pending.  Will contact her if this is abnormal and changes her treatment plan.  Discussed that ultimately she will likely need imaging which we do not have available in urgent care.  If her symptoms are worsening or changing in any way she needs to go to the emergency room including pelvic pain, abdominal pain,  recurrent bleeding, fever, nausea/vomiting interfere with oral intake.  Strict return precautions given.  Work excuse note provided.  Final Clinical Impressions(s) / UC Diagnoses   Final diagnoses:  Abnormal uterine bleeding (AUB)  Lower abdominal pain     Discharge Instructions      [facility]: [place] for [place] at [place]: [place] for [place] at [place]: [place] for [place] at [place]: [place] for [place] at [facility]: [place] for [place] at [place]: [place]: [phone]  Your urine was normal.  You are negative for pregnancy.  We will contact you if you are positive for any thing on your swab.  Monitor your MyChart for these results.  It is important follow-up with OB/GYN as soon as possible.  Since you cannot see your primary OB/GYN I recommend calling the above clinic to see if they can fit you in sooner.  If anything changes or worsens you need to go to the emergency room since we do not have imaging capabilities in urgent care.  Take ibuprofen 800 mg up to 3 times a day for pain.  Do not take additional NSAIDs with this medication including aspirin, ibuprofen/Advil, naproxen/Aleve.  If anything changes or worsens and you have worsening pain, recurrent bleeding, fever, nausea/vomiting interfere with oral intake you need to be seen immediately.     ED Prescriptions     Medication Sig Dispense Auth. Provider   ibuprofen (ADVIL) 800 MG tablet Take 1 tablet (800 mg total) by mouth 3 (three) times daily. 21 tablet Brinleigh Tew, Derry Skill, PA-C      PDMP not reviewed this encounter.   Terrilee Croak, PA-C 04/13/22 1338

## 2022-04-13 NOTE — Discharge Instructions (Addendum)
Center for [employer] [facility] at [facility]: [phone] Center for [facility] at [facility]: [phone] Center for [facility] at [place]: [phone] Center for [facility] Healthcare at [place]: [phone] Center for [facility] Healthcare at [facility]: [phone] Center for [facility] Healthcare at [place]: [phone] [facility] [place]: [phone]  Your urine was normal.  You are negative for pregnancy.  We will contact you if you are positive for any thing on your swab.  Monitor your MyChart for these results.  It is important follow-up with OB/GYN as soon as possible.  Since you cannot see your primary OB/GYN I recommend calling the above clinic to see if they can fit you in sooner.  If anything changes or worsens you need to go to the emergency room since we do not have imaging capabilities in urgent care.  Take ibuprofen 800 mg up to 3 times a day for pain.  Do not take additional NSAIDs with this medication including aspirin, ibuprofen/Advil, naproxen/Aleve.  If anything changes or worsens and you have worsening pain, recurrent bleeding, fever, nausea/vomiting interfere with oral intake you need to be seen immediately.

## 2022-04-14 ENCOUNTER — Telehealth (HOSPITAL_COMMUNITY): Payer: Self-pay | Admitting: Emergency Medicine

## 2022-04-14 LAB — CERVICOVAGINAL ANCILLARY ONLY
Bacterial Vaginitis (gardnerella): POSITIVE — AB
Candida Glabrata: NEGATIVE
Candida Vaginitis: NEGATIVE
Chlamydia: NEGATIVE
Comment: NEGATIVE
Comment: NEGATIVE
Comment: NEGATIVE
Comment: NEGATIVE
Comment: NEGATIVE
Comment: NORMAL
Neisseria Gonorrhea: NEGATIVE
Trichomonas: NEGATIVE

## 2022-04-14 MED ORDER — METRONIDAZOLE 500 MG PO TABS: 500.0000 mg | ORAL_TABLET | Freq: Two times a day (BID) | ORAL | 0 refills | Status: DC

## 2022-04-20 ENCOUNTER — Ambulatory Visit: Payer: Medicaid Other | Admitting: Psychiatry

## 2022-04-20 ENCOUNTER — Encounter: Payer: Self-pay | Admitting: Psychiatry

## 2022-04-20 NOTE — Progress Notes (Deleted)
   CC:  headaches  Follow-up Visit  Last visit: 10/26/21  Brief HPI: 32 year old female with a history of bipolar 1 disorder, ADHD, migraines, mild asthma who follows in clinic for migraines.  At her last visit, brain MRI was ordered. She was started on propranolol for prevention and Maxalt for rescue.  Interval History: Headaches***  Brain MRI 11/20/21 was unremarkable.  Headache days per month: *** Migraine days per month*** Headache free days per month: ***  Current Headache Regimen: Preventative: *** Abortive: ***   Prior Therapies                                  Lamictal 100 mg BID Fluoxetine 20 mg daily Propranolol 20 mg BID Excedrin  Physical Exam:   Vital Signs: LMP 04/11/2022 (Exact Date)  GENERAL:  well appearing, in no acute distress, alert  SKIN:  Color, texture, turgor normal. No rashes or lesions HEAD:  Normocephalic/atraumatic. RESP: normal respiratory effort MSK:  No gross joint deformities.   NEUROLOGICAL: Mental Status: Alert, oriented to person, place and time, Follows commands, and Speech fluent and appropriate. Cranial Nerves: PERRL, face symmetric, no dysarthria, hearing grossly intact Motor: moves all extremities equally Gait: normal-based.  IMPRESSION: ***  PLAN: ***   Follow-up: ***  I spent a total of *** minutes on the date of the service. Headache education was done. Discussed lifestyle modification including increased oral hydration, decreased caffeine, exercise and stress management. Discussed treatment options including preventive and acute medications, natural supplements, and infusion therapy. Discussed medication overuse headache and to limit use of acute treatments to no more than 2 days/week or 10 days/month. Discussed medication side effects, adverse reactions and drug interactions. Written educational materials and patient instructions outlining all of the above were given.  Genia Harold, MD

## 2022-05-04 ENCOUNTER — Ambulatory Visit
Admission: RE | Admit: 2022-05-04 | Discharge: 2022-05-04 | Disposition: A | Discharge status: Home / Self Care | Payer: Medicaid Other | Source: Ambulatory Visit | Attending: Urgent Care | Admitting: Urgent Care

## 2022-05-04 VITALS — BP 118/66 | HR 76 | Temp 98.5°F | Resp 16

## 2022-05-04 DIAGNOSIS — B3731 Acute candidiasis of vulva and vagina: Secondary | ICD-10-CM | POA: Diagnosis not present

## 2022-05-04 LAB — POCT URINE PREGNANCY: Preg Test, Ur: NEGATIVE

## 2022-05-04 MED ORDER — FLUCONAZOLE 150 MG PO TABS: 150.0000 mg | ORAL_TABLET | ORAL | 0 refills | Status: DC

## 2022-05-04 NOTE — ED Triage Notes (Signed)
Pt c/o vaginal itching and irritation that began about two weeks ago.  Home interventions: monistat

## 2022-05-04 NOTE — ED Provider Notes (Signed)
[facility]  Note:  This document was prepared using Systems analyst and may include unintentional dictation errors.  MRN: [id number] DOB: [birth year]  Subjective:   Miranda Robertson is a 32 y.o. female presenting for 10-day history of acute onset persistent vaginal itching, vaginal swelling and redness. LMP was 1 week ago but wants a pregnancy test.  Patient just finished treatment for bacterial vaginosis at the end of December 2023. Denies fever, n/v, abdominal pain, pelvic pain, rashes, dysuria, urinary frequency, hematuria.    No current facility-administered medications for this encounter.  Current Outpatient Medications:    albuterol (VENTOLIN HFA) 108 (90 Base) MCG/ACT inhaler, Inhale 1-2 puffs into the lungs every 6 (six) hours as needed for wheezing or shortness of breath., Disp: 1 each, Rfl: 0   diphenhydrAMINE HCl, Sleep, (UNISOM SLEEPMELTS) 25 MG TBDP, Take 1-3 tablets (25-75 mg total) by mouth at bedtime., Disp: 90 tablet, Rfl: 3   FLUoxetine (PROZAC) 20 MG capsule, Take 1 capsule (20 mg total) by mouth daily., Disp: 30 capsule, Rfl: 3   HYDROcodone-acetaminophen (NORCO/VICODIN) 5-325 MG tablet, Take 1-2 tablets by mouth every 6 (six) hours as needed., Disp: 8 tablet, Rfl: 0   HYDROcodone-acetaminophen (NORCO/VICODIN) 5-325 MG tablet, Take 1 tablet by mouth every 6 (six) hours as needed for up to 15 doses., Disp: 15 tablet, Rfl: 0   ibuprofen (ADVIL) 800 MG tablet, Take 1 tablet (800 mg total) by mouth 3 (three) times daily., Disp: 21 tablet, Rfl: 0   lamoTRIgine (LAMICTAL) 100 MG tablet, Take 1 tablet (100 mg total) by mouth 2 (two) times daily., Disp: 60 tablet, Rfl: 3   lisdexamfetamine (VYVANSE) 20 MG capsule, TAKE ONE CAPSULE BY MOUTH DAILY IN THE MORNING, Disp: 30 capsule, Rfl: 0   lurasidone (LATUDA) 80 MG TABS tablet, Take 1 tablet (80 mg total) by mouth daily with breakfast., Disp: 30 tablet, Rfl: 3   metroNIDAZOLE (FLAGYL) 500 MG  tablet, Take 1 tablet (500 mg total) by mouth 2 (two) times daily., Disp: 14 tablet, Rfl: 0   propranolol (INDERAL) 20 MG tablet, Take 1 tablet (20 mg total) by mouth 2 (two) times daily., Disp: 60 tablet, Rfl: 6   rizatriptan (MAXALT) 5 MG tablet, Take 1 tablet (5 mg total) by mouth as needed for migraine. May repeat in 2 hours if needed, Disp: 10 tablet, Rfl: 6   No Known Allergies  Past Medical History:  Diagnosis Date   Asthma    Childhood   Depression    HSV (herpes simplex virus) infection    Mental disorder    bipolar, depression, mild menatl retardation, suicidal thoughts in 2010     Past Surgical History:  Procedure Laterality Date   CESAREAN SECTION  06/09/12   FTP   CESAREAN SECTION N/A 05/21/2014   Procedure: CESAREAN SECTION;  Surgeon: Donnamae Jude, MD;  Location: [facility];  Service: Obstetrics;  Laterality: N/A;   CHOLECYSTECTOMY     WISDOM TOOTH EXTRACTION      Family History  Problem Relation Age of Onset   Headache Father    Headache Sister     Social History   Tobacco Use   Smoking status: Former    Packs/day: 0.50    Types: Cigarettes    Quit date: 01/17/2018    Years since quitting: 4.2   Smokeless tobacco: Never  Vaping Use   Vaping Use: Never used  Substance Use Topics   Alcohol use: Yes    Comment: occassonal  Drug use: Yes    Types: Marijuana    ROS   Objective:   Vitals: BP 118/66 (BP Location: Left Arm)   Pulse 76   Temp 98.5 F (36.9 C) (Oral)   Resp 16   LMP 04/25/2022 (Exact Date)   SpO2 97%   Physical Exam Constitutional:      General: She is not in acute distress.    Appearance: Normal appearance. She is well-developed. She is not ill-appearing, toxic-appearing or diaphoretic.  HENT:     Head: Normocephalic and atraumatic.     Nose: Nose normal.     Mouth/Throat:     Mouth: Mucous membranes are moist.  Eyes:     General: No scleral icterus.       Right eye: No discharge.        Left eye: No discharge.      Extraocular Movements: Extraocular movements intact.  Cardiovascular:     Rate and Rhythm: Normal rate.  Pulmonary:     Effort: Pulmonary effort is normal.  Skin:    General: Skin is warm and dry.  Neurological:     General: No focal deficit present.     Mental Status: She is alert and oriented to person, place, and time.  Psychiatric:        Mood and Affect: Mood normal.        Behavior: Behavior normal.     Results for orders placed or performed during the hospital encounter of 05/04/22 (from the past 24 hour(s))  POCT urine pregnancy     Status: None   Collection Time: 05/04/22  9:38 AM  Result Value Ref Range   Preg Test, Ur Negative Negative    Assessment and Plan :   PDMP not reviewed this encounter.  1. Yeast vaginitis     Will treat empirically for yeast vaginitis with fluconazole.  Labs pending, will treat as appropriate otherwise. Counseled patient on potential for adverse effects with medications prescribed/recommended today, ER and return-to-clinic precautions discussed, patient verbalized understanding.    Jaynee Eagles, [place] 05/04/22 [phone]

## 2022-05-05 ENCOUNTER — Encounter (HOSPITAL_COMMUNITY): Payer: Self-pay | Admitting: Psychiatry

## 2022-05-05 ENCOUNTER — Telehealth (INDEPENDENT_AMBULATORY_CARE_PROVIDER_SITE_OTHER): Payer: Medicaid Other | Admitting: Psychiatry

## 2022-05-05 ENCOUNTER — Telehealth (HOSPITAL_COMMUNITY): Payer: Self-pay | Admitting: Emergency Medicine

## 2022-05-05 DIAGNOSIS — F319 Bipolar disorder, unspecified: Secondary | ICD-10-CM

## 2022-05-05 DIAGNOSIS — F411 Generalized anxiety disorder: Secondary | ICD-10-CM

## 2022-05-05 LAB — CERVICOVAGINAL ANCILLARY ONLY
Bacterial Vaginitis (gardnerella): POSITIVE — AB
Candida Glabrata: NEGATIVE
Candida Vaginitis: NEGATIVE
Chlamydia: NEGATIVE
Comment: NEGATIVE
Comment: NEGATIVE
Comment: NEGATIVE
Comment: NEGATIVE
Comment: NEGATIVE
Comment: NORMAL
Neisseria Gonorrhea: NEGATIVE
Trichomonas: NEGATIVE

## 2022-05-05 MED ORDER — FLUOXETINE HCL 20 MG PO CAPS: 20.0000 mg | ORAL_CAPSULE | Freq: Every day | ORAL | 3 refills | Status: DC

## 2022-05-05 MED ORDER — METRONIDAZOLE 500 MG PO TABS: 500.0000 mg | ORAL_TABLET | Freq: Two times a day (BID) | ORAL | 0 refills | Status: DC

## 2022-05-05 MED ORDER — LURASIDONE HCL 80 MG PO TABS: 80.0000 mg | ORAL_TABLET | Freq: Every day | ORAL | 3 refills | Status: DC

## 2022-05-05 MED ORDER — UNISOM SLEEPMELTS 25 MG PO TBDP: 25.0000 mg | ORAL_TABLET | Freq: Every evening | ORAL | 3 refills | Status: DC

## 2022-05-05 NOTE — Progress Notes (Signed)
BH MD/PA/NP OP Progress Note Virtual Visit via Video Note  I connected with Miranda Robertson on 05/05/22 at  1:00 PM EST by a video enabled telemedicine application and verified that I am speaking with the correct person using two identifiers.  Location: Patient: Home Provider: Clinic   I discussed the limitations of evaluation and management by telemedicine and the availability of in person appointments. The patient expressed understanding and agreed to proceed.  I provided 30 minutes of non-face-to-face time during this encounter.     05/05/2022 1:49 PM Miranda Robertson  MRN:  [id number]  Chief Complaint: "I am doing horrible"  HPI: 32 year old female seen today for follow-up psychiatric evaluation.  She has a psychiatric history of ADHD, bipolar disorder 1, PTSD and depression.  She is currently managed on vyvanse 20 mg daily, Lamictal 100 mg twice daily, Unisom 25-75 mg nightly as needed,  Prozac 20 mg daily, and Latuda 80 mg nightly. She reports that she discontinued her medications a month ago and a half ago.    Today she was well-groomed, pleasant, cooperative, and engaged in conversation.  She informed Probation officer that she is doing horrible.  She notes that a month and a half ago she discontinued her medications.  She also informed Probation officer that she is under investigation by [employer].  She informed Probation officer that her neighbor called [employer]PS to inform them that she was smoking marijuana in front of her son, being angry/aggressive, and in a depressive state.  She reports that she fears losing her son however notes that she was told by the CPS worker that charges most likely will not be brought against her and her son will not be taken away.  To cope with the stressors patient notes that she uses marijuana daily.  She describes being irritable, distractible, and having racing thoughts.  Today she denies SI/HI/AVH or paranoia.  Provider discussed doing a UDS prior to restarting Vyvanse.  She endorsed understanding  however notes that at this time she would not be able to pass a urine drug screen.  She notes that she has marijuana in her system but denies other illegal substances.  Provider recommended restarting a nonstimulant.  At this time she is not interested.  She was agreeable to restarting Unisom 25 to 75 mg nightly as needed, Latuda 80 mg nightly, and Prozac 20 mg daily.  At this time Lamictal and Vyvanse not restarted.  No other concerns noted at this time.    Visit Diagnosis:    ICD-10-CM   1. Generalized anxiety disorder  F41.1 FLUoxetine (PROZAC) 20 MG capsule    2. Bipolar I disorder (HCC)  F31.9 FLUoxetine (PROZAC) 20 MG capsule    lurasidone (LATUDA) 80 MG TABS tablet    diphenhydrAMINE HCl, Sleep, (UNISOM SLEEPMELTS) 25 MG TBDP      Past Psychiatric History: Bipolar disorder 1, PTSD and depression   Past Medical History:  Past Medical History:  Diagnosis Date   Asthma    Childhood   Depression    HSV (herpes simplex virus) infection    Mental disorder    bipolar, depression, mild menatl retardation, suicidal thoughts in 2010    Past Surgical History:  Procedure Laterality Date   CESAREAN SECTION  06/09/12   FTP   CESAREAN SECTION N/A 05/21/2014   Procedure: CESAREAN SECTION;  Surgeon: Donnamae Jude, MD;  Location: [facility];  Service: Obstetrics;  Laterality: N/A;   CHOLECYSTECTOMY     WISDOM TOOTH EXTRACTION      Family  Psychiatric History: Depression mother, father, paternal grandmother, paternal aunts, paternal consins  Family History:  Family History  Problem Relation Age of Onset   Headache Father    Headache Sister     Social History:  Social History   Socioeconomic History   Marital status: Single    Spouse name: Not on file   Number of children: Not on file   Years of education: Not on file   Highest education level: Not on file  Occupational History   Not on file  Tobacco Use   Smoking status: Former    Packs/day: 0.50    Types: Cigarettes    Quit  date: 01/17/2018    Years since quitting: 4.2   Smokeless tobacco: Never  Vaping Use   Vaping Use: Never used  Substance and Sexual Activity   Alcohol use: Yes    Comment: occassonal   Drug use: Yes    Types: Marijuana   Sexual activity: Yes    Birth control/protection: Inserts  Other Topics Concern   Not on file  Social History Narrative   Right handed   Caffeine intake 8 cups a week   Social Determinants of Health   Financial Resource Strain: Not on file  Food Insecurity: Not on file  Transportation Needs: Not on file  Physical Activity: Not on file  Stress: Not on file  Social Connections: Not on file    Allergies: No Known Allergies  Metabolic Disorder Labs: Lab Results  Component Value Date   HGBA1C 5.2 12/09/2017   MPG 111 01/26/2007   No results found for: "PROLACTIN" Lab Results  Component Value Date   CHOL 187 12/09/2017   TRIG 201 (H) 12/09/2017   HDL 53 12/09/2017   CHOLHDL 3.5 12/09/2017   VLDL 57 (H) 01/26/2007   LDLCALC 94 12/09/2017   LDLCALC 80 07/23/2016   Lab Results  Component Value Date   TSH 0.920 04/13/2022   TSH 1.770 12/09/2017    Therapeutic Level Labs: No results found for: "LITHIUM" No results found for: "VALPROATE" No results found for: "CBMZ"  Current Medications: Current Outpatient Medications  Medication Sig Dispense Refill   albuterol (VENTOLIN HFA) 108 (90 Base) MCG/ACT inhaler Inhale 1-2 puffs into the lungs every 6 (six) hours as needed for wheezing or shortness of breath. 1 each 0   diphenhydrAMINE HCl, Sleep, (UNISOM SLEEPMELTS) 25 MG TBDP Take 1-3 tablets (25-75 mg total) by mouth at bedtime. 90 tablet 3   fluconazole (DIFLUCAN) 150 MG tablet Take 1 tablet (150 mg total) by mouth every 3 (three) days. 4 tablet 0   FLUoxetine (PROZAC) 20 MG capsule Take 1 capsule (20 mg total) by mouth daily. 30 capsule 3   HYDROcodone-acetaminophen (NORCO/VICODIN) 5-325 MG tablet Take 1-2 tablets by mouth every 6 (six) hours as  needed. 8 tablet 0   HYDROcodone-acetaminophen (NORCO/VICODIN) 5-325 MG tablet Take 1 tablet by mouth every 6 (six) hours as needed for up to 15 doses. 15 tablet 0   ibuprofen (ADVIL) 800 MG tablet Take 1 tablet (800 mg total) by mouth 3 (three) times daily. 21 tablet 0   lurasidone (LATUDA) 80 MG TABS tablet Take 1 tablet (80 mg total) by mouth daily with breakfast. 30 tablet 3   metroNIDAZOLE (FLAGYL) 500 MG tablet Take 1 tablet (500 mg total) by mouth 2 (two) times daily. 14 tablet 0   rizatriptan (MAXALT) 5 MG tablet Take 1 tablet (5 mg total) by mouth as needed for migraine. May repeat in 2 hours  if needed 10 tablet 6   No current facility-administered medications for this visit.     Musculoskeletal: Strength & Muscle Tone: within normal limits and telehealth visit Gait & Station: normal, telehealth visit Patient leans: N/A  Psychiatric Specialty Exam: Review of Systems  Last menstrual period 04/25/2022.There is no height or weight on file to calculate BMI.  General Appearance: Well Groomed  Eye Contact:  Good  Speech:  Clear and Coherent and Normal Rate  Volume:  Normal  Mood:  Euthymic  Affect:  Appropriate and Congruent  Thought Process:  Coherent, Goal Directed and Linear  Orientation:  Full (Time, Place, and Person)  Thought Content: WDL and Logical   Suicidal Thoughts:  No  Homicidal Thoughts:  No  Memory:  Immediate;   Good Recent;   Good Remote;   Good  Judgement:  Good  Insight:  Good  Psychomotor Activity:  Normal  Concentration:  Concentration: Good and Attention Span: Good  Recall:  Good  Fund of Knowledge: Good  Language: Good  Akathisia:  No  Handed:  Right  AIMS (if indicated):Not done  Assets:  Communication Skills Desire for Improvement Financial Resources/Insurance Housing Intimacy Social Support  ADL's:  Intact  Cognition: WNL  Sleep:  Fair   Screenings: AUDIT    Flowsheet Row Clinical Support from 02/21/2020 in [facility]  Alcohol Use Disorder Identification Test Final Score (AUDIT) 15      GAD-7    Flowsheet Row Video Visit from 05/05/2022 in [facility] Video Visit from 01/11/2022 in [facility] Video Visit from 10/07/2021 in [facility] Video Visit from 04/28/2021 in [facility] Video Visit from 01/26/2021 in [facility]  Total GAD-7 Score 21 18 10 11 14       PHQ2-9    Flowsheet Row Video Visit from 05/05/2022 in [facility] Video Visit from 01/11/2022 in [facility] Video Visit from 10/07/2021 in [facility] Video Visit from 04/28/2021 in [facility] Video Visit from 01/26/2021 in [place]  PHQ-2 Total Score 3 3 1 1 4   PHQ-9 Total Score 14 7 4 14 19       Flowsheet Row Video Visit from 05/05/2022 in [facility] ED from 05/04/2022 in [facility] [place] ED from 04/13/2022 in [facility] [place] No Risk No Risk No Risk        Assessment and Plan: Patient symptoms of anxiety, depression, hypomania, and insomnia.  Patient reports that she uses marijuana daily.  Today provider discussed doing a UDS prior to restarting Vyvanse.  She endorsed understanding however notes that at this time she would not be able to pass a urine drug screen.  She notes that she has marijuana in her system but denies other illegal substances.  Provider recommended restarting a nonstimulant.  At this time she is not interested.  She was agreeable to restarting Unisom 25 to 75 mg nightly as needed, Latuda 80 mg nightly, and Prozac 20 mg daily.  At this time Lamictal and Vyvanse not restarted.   1. Generalized anxiety disorder  Restart- FLUoxetine  (PROZAC) 20 MG capsule; Take 1 capsule (20 mg total) by mouth daily.  Dispense: 30 capsule; Refill: 3  2. Bipolar I disorder (HCC)  Restart- FLUoxetine (PROZAC) 20  MG capsule; Take 1 capsule (20 mg total) by mouth daily.  Dispense: 30 capsule; Refill: 3 Restart- lurasidone (LATUDA) 80 MG TABS tablet; Take 1 tablet (80 mg total) by mouth daily with breakfast.  Dispense: 30 tablet; Refill: 3 Restart- diphenhydrAMINE HCl, Sleep, (UNISOM SLEEPMELTS) 25 MG TBDP; Take 1-3 tablets (25-75 mg total) by mouth at bedtime.  Dispense: 90 tablet; Refill: 3  Follow-up in 3 month    Salley Slaughter, NP 05/05/2022, 1:49 PM

## 2022-05-11 ENCOUNTER — Ambulatory Visit: Payer: Medicaid Other | Admitting: Psychiatry

## 2022-05-19 ENCOUNTER — Other Ambulatory Visit: Payer: Self-pay | Admitting: Psychiatry

## 2022-05-19 ENCOUNTER — Other Ambulatory Visit (HOSPITAL_COMMUNITY): Payer: Self-pay | Admitting: Psychiatry

## 2022-05-19 DIAGNOSIS — F319 Bipolar disorder, unspecified: Secondary | ICD-10-CM

## 2022-05-19 NOTE — Telephone Encounter (Signed)
Last seen on 10/26/21 No follow up scheduled.

## 2022-05-25 ENCOUNTER — Ambulatory Visit: Payer: Self-pay | Admitting: Obstetrics and Gynecology

## 2022-06-04 ENCOUNTER — Ambulatory Visit
Admission: RE | Admit: 2022-06-04 | Discharge: 2022-06-04 | Disposition: A | Discharge status: Home / Self Care | Payer: Medicaid Other | Source: Ambulatory Visit | Attending: Urgent Care | Admitting: Urgent Care

## 2022-06-04 VITALS — BP 117/81 | HR 70 | Temp 98.6°F | Resp 18

## 2022-06-04 DIAGNOSIS — J4530 Mild persistent asthma, uncomplicated: Secondary | ICD-10-CM | POA: Diagnosis not present

## 2022-06-04 DIAGNOSIS — F1290 Cannabis use, unspecified, uncomplicated: Secondary | ICD-10-CM

## 2022-06-04 DIAGNOSIS — R079 Chest pain, unspecified: Secondary | ICD-10-CM

## 2022-06-04 DIAGNOSIS — J018 Other acute sinusitis: Secondary | ICD-10-CM | POA: Diagnosis not present

## 2022-06-04 MED ORDER — FLUCONAZOLE 150 MG PO TABS: 150.0000 mg | ORAL_TABLET | ORAL | 0 refills | Status: DC

## 2022-06-04 MED ORDER — AMOXICILLIN-POT CLAVULANATE 875-125 MG PO TABS: 1.0000 | ORAL_TABLET | Freq: Two times a day (BID) | ORAL | 0 refills | Status: DC

## 2022-06-04 MED ORDER — ALBUTEROL SULFATE HFA 108 (90 BASE) MCG/ACT IN AERS: 1.0000 | INHALATION_SPRAY | Freq: Four times a day (QID) | RESPIRATORY_TRACT | 0 refills | Status: DC | PRN

## 2022-06-04 MED ORDER — PREDNISONE 20 MG PO TABS: ORAL_TABLET | ORAL | 0 refills | Status: DC

## 2022-06-04 MED ORDER — PROMETHAZINE-DM 6.25-15 MG/5ML PO SYRP: 5.0000 mL | ORAL_SOLUTION | Freq: Three times a day (TID) | ORAL | 0 refills | Status: DC | PRN

## 2022-06-04 MED ORDER — LEVOCETIRIZINE DIHYDROCHLORIDE 5 MG PO TABS: 5.0000 mg | ORAL_TABLET | Freq: Every evening | ORAL | 0 refills | Status: DC

## 2022-06-04 NOTE — ED Triage Notes (Signed)
Pt c/o dry cough, right side CP, sore throat, right earache x 4 days-states nasal congestion, runny nose x 1 week-NAD-steady gait

## 2022-06-04 NOTE — ED Provider Notes (Signed)
[facility]  Note:  This document was prepared using Systems analyst and may include unintentional dictation errors.  MRN: [id number] DOB: 03-Jun-1990  Subjective:   Miranda Robertson is a 32 y.o. female presenting for 1 week history of persistent and worsening runny and stuffy nose, sinus drainage, now having coughing with right-sided chest pain, throat pain, right ear pain, malaise and fatigue for the past 4 days.  Patient smokes marijuana daily.  Has a history of asthma, needs a refill on her albuterol inhaler.  No current facility-administered medications for this encounter.  Current Outpatient Medications:    albuterol (VENTOLIN HFA) 108 (90 Base) MCG/ACT inhaler, Inhale 1-2 puffs into the lungs every 6 (six) hours as needed for wheezing or shortness of breath., Disp: 1 each, Rfl: 0   diphenhydrAMINE HCl, Sleep, (UNISOM SLEEPMELTS) 25 MG TBDP, Take 1-3 tablets (25-75 mg total) by mouth at bedtime., Disp: 90 tablet, Rfl: 3   fluconazole (DIFLUCAN) 150 MG tablet, Take 1 tablet (150 mg total) by mouth every 3 (three) days., Disp: 4 tablet, Rfl: 0   FLUoxetine (PROZAC) 20 MG capsule, Take 1 capsule (20 mg total) by mouth daily., Disp: 30 capsule, Rfl: 3   HYDROcodone-acetaminophen (NORCO/VICODIN) 5-325 MG tablet, Take 1-2 tablets by mouth every 6 (six) hours as needed., Disp: 8 tablet, Rfl: 0   HYDROcodone-acetaminophen (NORCO/VICODIN) 5-325 MG tablet, Take 1 tablet by mouth every 6 (six) hours as needed for up to 15 doses., Disp: 15 tablet, Rfl: 0   ibuprofen (ADVIL) 800 MG tablet, Take 1 tablet (800 mg total) by mouth 3 (three) times daily., Disp: 21 tablet, Rfl: 0   lurasidone (LATUDA) 80 MG TABS tablet, Take 1 tablet (80 mg total) by mouth daily with breakfast., Disp: 30 tablet, Rfl: 3   metroNIDAZOLE (FLAGYL) 500 MG tablet, Take 1 tablet (500 mg total) by mouth 2 (two) times daily., Disp: 14 tablet, Rfl: 0   propranolol (INDERAL) 20 MG tablet, TAKE  ONE TABLET BY MOUTH TWICE A DAY, Disp: 60 tablet, Rfl: 0   rizatriptan (MAXALT) 5 MG tablet, TAKE ONE TABLET BY MOUTH AS NEEDED FOR MIGRAINE. MAY REPEAT IN TWO HOURS IF NEEDED, Disp: 10 tablet, Rfl: 0   No Known Allergies  Past Medical History:  Diagnosis Date   Asthma    Childhood   Depression    HSV (herpes simplex virus) infection    Mental disorder    bipolar, depression, mild menatl retardation, suicidal thoughts in 2010     Past Surgical History:  Procedure Laterality Date   CESAREAN SECTION  06/09/12   FTP   CESAREAN SECTION N/A 05/21/2014   Procedure: CESAREAN SECTION;  Surgeon: Donnamae Jude, MD;  Location: [facility];  Service: Obstetrics;  Laterality: N/A;   CHOLECYSTECTOMY     WISDOM TOOTH EXTRACTION      Family History  Problem Relation Age of Onset   Headache Father    Headache Sister     Social History   Tobacco Use   Smoking status: Every Day    Types: Cigars   Smokeless tobacco: Never  Vaping Use   Vaping Use: Never used  Substance Use Topics   Alcohol use: Yes    Comment: occassonal   Drug use: Yes    Types: Marijuana    ROS   Objective:   Vitals: BP 117/81 (BP Location: Left Arm)   Pulse 70   Temp 98.6 F (37 C) (Oral)   Resp 18  LMP 05/21/2022   SpO2 97%   Physical Exam Constitutional:      General: She is not in acute distress.    Appearance: Normal appearance. She is well-developed and normal weight. She is not ill-appearing, toxic-appearing or diaphoretic.  HENT:     Head: Normocephalic and atraumatic.     Right Ear: Tympanic membrane, ear canal and external ear normal. No drainage or tenderness. No middle ear effusion. There is no impacted cerumen. Tympanic membrane is not erythematous or bulging.     Left Ear: Tympanic membrane, ear canal and external ear normal. No drainage or tenderness.  No middle ear effusion. There is no impacted cerumen. Tympanic membrane is not erythematous or bulging.     Nose: Congestion and rhinorrhea  present.     Mouth/Throat:     Mouth: Mucous membranes are moist. No oral lesions.     Pharynx: Posterior oropharyngeal erythema (with associated thick post-nasal drainage overlying pharynx) present. No pharyngeal swelling, oropharyngeal exudate or uvula swelling.     Tonsils: No tonsillar exudate or tonsillar abscesses.  Eyes:     General: No scleral icterus.       Right eye: No discharge.        Left eye: No discharge.     Extraocular Movements: Extraocular movements intact.     Right eye: Normal extraocular motion.     Left eye: Normal extraocular motion.     Conjunctiva/sclera: Conjunctivae normal.  Cardiovascular:     Rate and Rhythm: Normal rate and regular rhythm.     Heart sounds: Normal heart sounds. No murmur heard.    No friction rub. No gallop.  Pulmonary:     Effort: Pulmonary effort is normal. No respiratory distress.     Breath sounds: No stridor. No wheezing, rhonchi or rales.  Chest:     Chest wall: No tenderness.  Musculoskeletal:     Cervical back: Normal range of motion and neck supple.  Lymphadenopathy:     Cervical: No cervical adenopathy.  Skin:    General: Skin is warm and dry.  Neurological:     General: No focal deficit present.     Mental Status: She is alert and oriented to person, place, and time.  Psychiatric:        Mood and Affect: Mood normal.        Behavior: Behavior normal.     Assessment and Plan :   PDMP not reviewed this encounter.  1. Acute non-recurrent sinusitis of other sinus   2. Right-sided chest pain   3. Mild persistent asthma without complication   4. Marijuana use     Deferred imaging given clear cardiopulmonary exam, hemodynamically stable vital signs. Will start empiric treatment for sinusitis with Augmentin.  In the context of her respiratory symptoms, asthma, daily marijuana use, recommended oral prednisone course.  Refilled her albuterol inhaler.  Will use oral fluconazole for secondary yeast infection from  antibiotic and steroid use, patient also is prone to these.  Recommended supportive care otherwise. Counseled patient on potential for adverse effects with medications prescribed/recommended today, ER and return-to-clinic precautions discussed, patient verbalized understanding.    Jaynee Eagles, PA-C 06/04/22 1102

## 2022-06-30 ENCOUNTER — Ambulatory Visit: Payer: Self-pay

## 2022-07-05 ENCOUNTER — Ambulatory Visit (HOSPITAL_COMMUNITY): Payer: Self-pay

## 2022-07-12 ENCOUNTER — Ambulatory Visit
Admission: RE | Admit: 2022-07-12 | Discharge: 2022-07-12 | Disposition: A | Discharge status: Home / Self Care | Payer: Medicaid Other | Source: Ambulatory Visit | Attending: Urgent Care | Admitting: Urgent Care

## 2022-07-12 VITALS — BP 131/86 | HR 92 | Temp 98.8°F | Resp 18

## 2022-07-12 DIAGNOSIS — J4530 Mild persistent asthma, uncomplicated: Secondary | ICD-10-CM

## 2022-07-12 DIAGNOSIS — J309 Allergic rhinitis, unspecified: Secondary | ICD-10-CM | POA: Diagnosis not present

## 2022-07-12 DIAGNOSIS — J329 Chronic sinusitis, unspecified: Secondary | ICD-10-CM | POA: Diagnosis not present

## 2022-07-12 MED ORDER — LEVOCETIRIZINE DIHYDROCHLORIDE 5 MG PO TABS: 5.0000 mg | ORAL_TABLET | Freq: Every evening | ORAL | 0 refills | Status: DC

## 2022-07-12 MED ORDER — CEFDINIR 300 MG PO CAPS: 300.0000 mg | ORAL_CAPSULE | Freq: Two times a day (BID) | ORAL | 0 refills | Status: DC

## 2022-07-12 MED ORDER — PREDNISONE 50 MG PO TABS: 50.0000 mg | ORAL_TABLET | Freq: Every day | ORAL | 0 refills | Status: DC

## 2022-07-12 NOTE — ED Provider Notes (Signed)
[facility]  Note:  This document was prepared using Systems analyst and may include unintentional dictation errors.  MRN: [id number] DOB: 1991-03-20  Subjective:   Miranda Robertson is a 32 y.o. female presenting for 2-week history of recurrent sinus congestion, sinus drainage, coughing, significant sinus pressure.  Was last seen a month ago for the same thing and reports that she did recover fully, had 1 day of symptomatic relief and progressively worsened to her current state.  Has a history of asthma, does not need an albuterol inhaler.  She did complete the entire regimen of the medications from last visit.  Still smokes marijuana multiple times daily.  No current facility-administered medications for this encounter.  Current Outpatient Medications:    albuterol (VENTOLIN HFA) 108 (90 Base) MCG/ACT inhaler, Inhale 1-2 puffs into the lungs every 6 (six) hours as needed for wheezing or shortness of breath., Disp: 18 g, Rfl: 0   amoxicillin-clavulanate (AUGMENTIN) 875-125 MG tablet, Take 1 tablet by mouth 2 (two) times daily., Disp: 20 tablet, Rfl: 0   diphenhydrAMINE HCl, Sleep, (UNISOM SLEEPMELTS) 25 MG TBDP, Take 1-3 tablets (25-75 mg total) by mouth at bedtime., Disp: 90 tablet, Rfl: 3   fluconazole (DIFLUCAN) 150 MG tablet, Take 1 tablet (150 mg total) by mouth every 3 (three) days., Disp: 4 tablet, Rfl: 0   FLUoxetine (PROZAC) 20 MG capsule, Take 1 capsule (20 mg total) by mouth daily., Disp: 30 capsule, Rfl: 3   HYDROcodone-acetaminophen (NORCO/VICODIN) 5-325 MG tablet, Take 1-2 tablets by mouth every 6 (six) hours as needed., Disp: 8 tablet, Rfl: 0   HYDROcodone-acetaminophen (NORCO/VICODIN) 5-325 MG tablet, Take 1 tablet by mouth every 6 (six) hours as needed for up to 15 doses., Disp: 15 tablet, Rfl: 0   ibuprofen (ADVIL) 800 MG tablet, Take 1 tablet (800 mg total) by mouth 3 (three) times daily., Disp: 21 tablet, Rfl: 0   levocetirizine (XYZAL)  5 MG tablet, Take 1 tablet (5 mg total) by mouth every evening., Disp: 90 tablet, Rfl: 0   lurasidone (LATUDA) 80 MG TABS tablet, Take 1 tablet (80 mg total) by mouth daily with breakfast., Disp: 30 tablet, Rfl: 3   metroNIDAZOLE (FLAGYL) 500 MG tablet, Take 1 tablet (500 mg total) by mouth 2 (two) times daily., Disp: 14 tablet, Rfl: 0   predniSONE (DELTASONE) 20 MG tablet, Take 2 tablets daily with breakfast., Disp: 10 tablet, Rfl: 0   promethazine-dextromethorphan (PROMETHAZINE-DM) 6.25-15 MG/5ML syrup, Take 5 mLs by mouth 3 (three) times daily as needed for cough., Disp: 200 mL, Rfl: 0   propranolol (INDERAL) 20 MG tablet, TAKE ONE TABLET BY MOUTH TWICE A DAY, Disp: 60 tablet, Rfl: 0   rizatriptan (MAXALT) 5 MG tablet, TAKE ONE TABLET BY MOUTH AS NEEDED FOR MIGRAINE. MAY REPEAT IN TWO HOURS IF NEEDED, Disp: 10 tablet, Rfl: 0   No Known Allergies  Past Medical History:  Diagnosis Date   Asthma    Childhood   Depression    HSV (herpes simplex virus) infection    Mental disorder    bipolar, depression, mild menatl retardation, suicidal thoughts in 2010     Past Surgical History:  Procedure Laterality Date   CESAREAN SECTION  06/09/12   FTP   CESAREAN SECTION N/A 05/21/2014   Procedure: CESAREAN SECTION;  Surgeon: Donnamae Jude, MD;  Location: [facility];  Service: Obstetrics;  Laterality: N/A;   CHOLECYSTECTOMY     WISDOM TOOTH EXTRACTION  Family History  Problem Relation Age of Onset   Headache Father    Headache Sister     Social History   Tobacco Use   Smoking status: Every Day    Types: Cigars   Smokeless tobacco: Never  Vaping Use   Vaping Use: Never used  Substance Use Topics   Alcohol use: Yes    Comment: occassonal   Drug use: Yes    Frequency: 7.0 times per week    Types: Marijuana    ROS   Objective:   Vitals: BP 131/86 (BP Location: Left Arm)   Pulse 92   Temp 98.8 F (37.1 C) (Oral)   Resp 18   LMP 07/09/2022 (Exact Date)   SpO2 92%    Physical Exam Constitutional:      General: She is not in acute distress.    Appearance: Normal appearance. She is well-developed and normal weight. She is not ill-appearing, toxic-appearing or diaphoretic.  HENT:     Head: Normocephalic and atraumatic.     Right Ear: Tympanic membrane, ear canal and external ear normal. No drainage or tenderness. No middle ear effusion. There is no impacted cerumen. Tympanic membrane is not erythematous or bulging.     Left Ear: Tympanic membrane, ear canal and external ear normal. No drainage or tenderness.  No middle ear effusion. There is no impacted cerumen. Tympanic membrane is not erythematous or bulging.     Nose: Congestion and rhinorrhea present.     Mouth/Throat:     Mouth: Mucous membranes are moist. No oral lesions.     Pharynx: No pharyngeal swelling, oropharyngeal exudate, posterior oropharyngeal erythema or uvula swelling.     Tonsils: No tonsillar exudate or tonsillar abscesses.     Comments: Thick purulent postnasal drainage overlying pharynx. Eyes:     General: No scleral icterus.       Right eye: No discharge.        Left eye: No discharge.     Extraocular Movements: Extraocular movements intact.     Right eye: Normal extraocular motion.     Left eye: Normal extraocular motion.     Conjunctiva/sclera: Conjunctivae normal.  Cardiovascular:     Rate and Rhythm: Normal rate and regular rhythm.     Heart sounds: Normal heart sounds. No murmur heard.    No friction rub. No gallop.  Pulmonary:     Effort: Pulmonary effort is normal. No respiratory distress.     Breath sounds: No stridor. No wheezing, rhonchi or rales.  Chest:     Chest wall: No tenderness.  Musculoskeletal:     Cervical back: Normal range of motion and neck supple.  Lymphadenopathy:     Cervical: No cervical adenopathy.  Skin:    General: Skin is warm and dry.  Neurological:     General: No focal deficit present.     Mental Status: She is alert and oriented to  person, place, and time.  Psychiatric:        Mood and Affect: Mood normal.        Behavior: Behavior normal.     Assessment and Plan :   PDMP not reviewed this encounter.  1. Recurrent sinusitis   2. Allergic rhinitis, unspecified seasonality, unspecified trigger   3. Mild persistent asthma without complication     Deferred imaging given clear cardiopulmonary exam, hemodynamically stable vital signs.  Will manage for persistent and recurrent sinusitis with cefdinir given that she just had Augmentin a month ago.  Will  increase the prednisone to 50 mg for 5 days.  Encouraged patient again to make efforts at quitting smoking.  Maintain allergy medications except for nasal spray. Counseled patient on potential for adverse effects with medications prescribed/recommended today, ER and return-to-clinic precautions discussed, patient verbalized understanding.    Jaynee Eagles, [place] 07/12/22 1334

## 2022-07-12 NOTE — ED Triage Notes (Signed)
Pt reports cough,  nasal congestion x 2 weeks. OTC meds gives no relief.

## 2022-07-20 ENCOUNTER — Encounter (HOSPITAL_COMMUNITY): Payer: Self-pay | Admitting: Psychiatry

## 2022-07-20 ENCOUNTER — Encounter (HOSPITAL_COMMUNITY): Payer: Self-pay

## 2022-07-20 ENCOUNTER — Telehealth (INDEPENDENT_AMBULATORY_CARE_PROVIDER_SITE_OTHER): Payer: Medicaid Other | Admitting: Psychiatry

## 2022-07-20 DIAGNOSIS — F319 Bipolar disorder, unspecified: Secondary | ICD-10-CM

## 2022-07-20 DIAGNOSIS — F411 Generalized anxiety disorder: Secondary | ICD-10-CM | POA: Diagnosis not present

## 2022-07-20 MED ORDER — UNISOM SLEEPMELTS 25 MG PO TBDP: 25.0000 mg | ORAL_TABLET | Freq: Every evening | ORAL | 3 refills | Status: DC

## 2022-07-20 MED ORDER — FLUOXETINE HCL 20 MG PO CAPS: 20.0000 mg | ORAL_CAPSULE | Freq: Every day | ORAL | 3 refills | Status: DC

## 2022-07-20 MED ORDER — LURASIDONE HCL 80 MG PO TABS: 80.0000 mg | ORAL_TABLET | Freq: Every day | ORAL | 3 refills | Status: DC

## 2022-07-20 NOTE — Progress Notes (Signed)
[place] MD/PA/NP OP Progress Note Virtual Visit via Video Note  I connected with Miranda Robertson on 07/20/22 at 11:00 AM EDT by a video enabled telemedicine application and verified that I am speaking with the correct person using two identifiers.  Location: Patient: Home Provider: Clinic   I discussed the limitations of evaluation and management by telemedicine and the availability of in person appointments. The patient expressed understanding and agreed to proceed.  I provided 30 minutes of non-face-to-face time during this encounter.     07/20/2022 10:53 AM Miranda Robertson  MRN:  [id number]  Chief Complaint: "I am amazing"  HPI: 32 year old female seen today for follow-up psychiatric evaluation.  She has a psychiatric history of ADHD, bipolar disorder 1, PTSD and depression.  She is currently managed on Unisom 25-75 mg nightly as needed,  Prozac 20 mg daily, and Latuda 80 mg nightly. She reports that her medications are somewhat effective in managing her mental health conditions.    Today she was well-groomed, pleasant, cooperative, and engaged in conversation.  She informed Probation officer that she is doing Media pl[employer].  She notes that a she no longer has a CPS case opened. 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 She notes that her son is doing well. She reports that he is in therapy and notes that his behaviors have improved. She informed Probation officer that mentally she feels stable and notes that she has minimal anxiety and depression. Today provider conducted a GAD 7 and patient scored 12. Provider also conducted a PHQ 9 and patient  scored an 8.   Today she denies SI/HI/AVH or paranoia.Patient notes that she sleeps 4-5 hours nightly.  Patient informed Probation officer that she continues to have symptoms of ADHD. She informed Probation officer that she is forgetful, disorganized, and inattentive to mentally taxing task. Provider discussed doing a UDS prior to restarting Vyvanse.  She endorsed understanding  however notes that she has reduced her marijuana consumption and is willing to take a UDS. Provider ordered UDS, CBC, Lipid panel, THS, LFT, and Hgb A1c. No medication changes made today. Patient agreeable to continue medications as prescribed. No other concerns noted at this time.       Visit Diagnosis:    ICD-10-CM   1. Generalized anxiety disorder  F41.1 FLUoxetine (PROZAC) 20 MG capsule    2. Bipolar I disorder  F31.9 FLUoxetine (PROZAC) 20 MG capsule    lurasidone (LATUDA) 80 MG TABS tablet    diphenhydrAMINE HCl, Sleep, (UNISOM SLEEPMELTS) 25 MG TBDP    CBC w/Diff/Platelet    Lipid Profile    Hepatic function panel    HgB A1c    Urine Drug Panel 7    CANCELED: POCT Urine Drug Screen      Past Psychiatric History: Bipolar disorder 1, PTSD and depression   Past Medical History:  Past Medical History:  Diagnosis Date   Asthma    Childhood   Depression    HSV (herpes simplex virus) infection    Mental disorder    bipolar, depression, mild menatl retardation, suicidal thoughts in 2010    Past Surgical History:  Procedure Laterality Date   CESAREAN SECTION  06/09/12   FTP   CESAREAN SECTION N/A 05/21/2014   Procedure: CESAREAN SECTION;  Surgeon: Donnamae Jude, MD;  Location: [facility];  Service: Obstetrics;  Laterality: N/A;   CHOLECYSTECTOMY     WISDOM TOOTH EXTRACTION      Family Psychiatric History: Depression mother, father, paternal grandmother, paternal  aunts, paternal consins  Family History:  Family History  Problem Relation Age of Onset   Headache Father    Headache Sister     Social History:  Social History   Socioeconomic History   Marital status: Single    Spouse name: Not on file   Number of children: Not on file   Years of education: Not on file   Highest education level: Not on file  Occupational History   Not on file  Tobacco Use   Smoking status: Every Day    Types: Cigars   Smokeless tobacco: Never  Vaping Use   Vaping Use: Never used   Substance and Sexual Activity   Alcohol use: Yes    Comment: occassonal   Drug use: Yes    Frequency: 7.0 times per week    Types: Marijuana   Sexual activity: Yes    Birth control/protection: Condom  Other Topics Concern   Not on file  Social History Narrative   Right handed   Caffeine intake 8 cups a week   Social Determinants of Health   Financial Resource Strain: Not on file  Food Insecurity: Not on file  Transportation Needs: Not on file  Physical Activity: Not on file  Stress: Not on file  Social Connections: Not on file    Allergies: No Known Allergies  Metabolic Disorder Labs: Lab Results  Component Value Date   HGBA1C 5.2 12/09/2017   MPG 111 01/26/2007   No results found for: "PROLACTIN" Lab Results  Component Value Date   CHOL 187 12/09/2017   TRIG 201 (H) 12/09/2017   HDL 53 12/09/2017   CHOLHDL 3.5 12/09/2017   VLDL 57 (H) 01/26/2007   [place] 94 12/09/2017   LDLCALC 80 07/23/2016   Lab Results  Component Value Date   TSH 0.920 04/13/2022   TSH 1.770 12/09/2017    Therapeutic Level Labs: No results found for: "LITHIUM" No results found for: "VALPROATE" No results found for: "CBMZ"  Current Medications: Current Outpatient Medications  Medication Sig Dispense Refill   albuterol (VENTOLIN HFA) 108 (90 Base) MCG/ACT inhaler Inhale 1-2 puffs into the lungs every 6 (six) hours as needed for wheezing or shortness of breath. 18 g 0   amoxicillin-clavulanate (AUGMENTIN) 875-125 MG tablet Take 1 tablet by mouth 2 (two) times daily. 20 tablet 0   cefdinir (OMNICEF) 300 MG capsule Take 1 capsule (300 mg total) by mouth 2 (two) times daily. 20 capsule 0   diphenhydrAMINE HCl, Sleep, (UNISOM SLEEPMELTS) 25 MG TBDP Take 1-3 tablets (25-75 mg total) by mouth at bedtime. 90 tablet 3   fluconazole (DIFLUCAN) 150 MG tablet Take 1 tablet (150 mg total) by mouth every 3 (three) days. 4 tablet 0   FLUoxetine (PROZAC) 20 MG capsule Take 1 capsule (20 mg total) by  mouth daily. 30 capsule 3   HYDROcodone-acetaminophen (NORCO/VICODIN) 5-325 MG tablet Take 1-2 tablets by mouth every 6 (six) hours as needed. 8 tablet 0   HYDROcodone-acetaminophen (NORCO/VICODIN) 5-325 MG tablet Take 1 tablet by mouth every 6 (six) hours as needed for up to 15 doses. 15 tablet 0   ibuprofen (ADVIL) 800 MG tablet Take 1 tablet (800 mg total) by mouth 3 (three) times daily. 21 tablet 0   levocetirizine (XYZAL) 5 MG tablet Take 1 tablet (5 mg total) by mouth every evening. 90 tablet 0   lurasidone (LATUDA) 80 MG TABS tablet Take 1 tablet (80 mg total) by mouth daily with breakfast. 30 tablet 3  metroNIDAZOLE (FLAGYL) 500 MG tablet Take 1 tablet (500 mg total) by mouth 2 (two) times daily. 14 tablet 0   predniSONE (DELTASONE) 50 MG tablet Take 1 tablet (50 mg total) by mouth daily with breakfast. 5 tablet 0   promethazine-dextromethorphan (PROMETHAZINE-DM) 6.25-15 MG/5ML syrup Take 5 mLs by mouth 3 (three) times daily as needed for cough. 200 mL 0   propranolol (INDERAL) 20 MG tablet TAKE ONE TABLET BY MOUTH TWICE A DAY 60 tablet 0   rizatriptan (MAXALT) 5 MG tablet TAKE ONE TABLET BY MOUTH AS NEEDED FOR MIGRAINE. MAY REPEAT IN TWO HOURS IF NEEDED 10 tablet 0   No current facility-administered medications for this visit.     Musculoskeletal: Strength & Muscle Tone: within normal limits and telehealth visit Gait & Station: normal, telehealth visit Patient leans: N/A  Psychiatric Specialty Exam: Review of Systems  Last menstrual period 07/09/2022.There is no height or weight on file to calculate BMI.  General Appearance: Well Groomed  Eye Contact:  Good  Speech:  Clear and Coherent and Normal Rate  Volume:  Normal  Mood:  Euthymic  Affect:  Appropriate and Congruent  Thought Process:  Coherent, Goal Directed and Linear  Orientation:  Full (Time, Place, and Person)  Thought Content: WDL and Logical   Suicidal Thoughts:  No  Homicidal Thoughts:  No  Memory:  Immediate;    Good Recent;   Good Remote;   Good  Judgement:  Good  Insight:  Good  Psychomotor Activity:  Normal  Concentration:  Concentration: Fair and Attention Span: Fair  Recall:  Good  Fund of Knowledge: Good  Language: Good  Akathisia:  No  Handed:  Right  AIMS (if indicated):Not done  Assets:  Communication Skills Desire for Improvement Financial Resources/Insurance Housing Intimacy Social Support  ADL's:  Intact  Cognition: WNL  Sleep:  Fair   Screenings: AUDIT    Flowsheet Row Clinical Support from 02/21/2020 in [facility]  Alcohol Use Disorder Identification Test Final Score (AUDIT) 15      GAD-7    Flowsheet Row Video Visit from 07/20/2022 in [facility] Video Visit from 05/05/2022 in [facility] Video Visit from 01/11/2022 in [facility] Video Visit from 10/07/2021 in [facility] Video Visit from 04/28/2021 in [facility]  Total GAD-7 Score 12 21 18 10 11       PHQ2-9    Flowsheet Row Video Visit from 07/20/2022 in [facility] Video Visit from 05/05/2022 in [facility] Video Visit from 01/11/2022 in [facility] Video Visit from 10/07/2021 in [facility] Video Visit from 04/28/2021 in [place]  PHQ-2 Total Score 1 3 3 1 1   PHQ-9 Total Score 8 14 7 4 14       [place] ED from 07/12/2022 in [facility] ED from 06/04/2022 in [facility] [place] [facility]) Video Visit from 05/05/2022 in [facility] No Risk No Risk No Risk        Assessment and Plan: Patient endorses symptoms of ADHD but notes that her anxiety, depression, and mood has improved.  Patient informed Probation officer that she continues to have symptoms of ADHD. She informed Probation officer that she is forgetful, disorganized, and inattentive to mentally taxing task. Provider discussed  doing a UDS prior to restarting Vyvanse.  She endorsed understanding however notes that she has reduced her marijuana consumption and is willing to take a UDS. Provider ordered UDS, CBC, Lipid panel, THS, LFT, and Hgb A1c. No medication changes made today. Patient agreeable to continue medications as prescribed. 1. Generalized anxiety disorder  Continue- FLUoxetine (PROZAC) 20 MG capsule; Take 1 capsule (20 mg total) by mouth daily.  Dispense: 30 capsule; Refill: 3  2. Bipolar I disorder  Continue- FLUoxetine (PROZAC) 20 MG capsule; Take 1 capsule (20 mg total) by mouth daily.  Dispense: 30 capsule; Refill: 3 Continue- lurasidone (LATUDA) 80 MG TABS tablet; Take 1 tablet (80 mg total) by mouth daily with breakfast.  Dispense: 30 tablet; Refill: 3 Continue- diphenhydrAMINE HCl, Sleep, (UNISOM SLEEPMELTS) 25 MG TBDP; Take 1-3 tablets (25-75 mg total) by mouth at bedtime.  Dispense: 90 tablet; Refill: 3 - CBC w/Diff/Platelet - Lipid Profile - Hepatic function panel - HgB A1c - Urine Drug Panel 7  Follow-up in 3 month    Salley Slaughter, NP 07/20/2022, 10:53 AM

## 2022-08-26 ENCOUNTER — Other Ambulatory Visit: Payer: Self-pay

## 2022-08-26 ENCOUNTER — Emergency Department (HOSPITAL_BASED_OUTPATIENT_CLINIC_OR_DEPARTMENT_OTHER): Payer: Medicaid Other

## 2022-08-26 ENCOUNTER — Ambulatory Visit: Payer: Self-pay

## 2022-08-26 ENCOUNTER — Emergency Department (HOSPITAL_BASED_OUTPATIENT_CLINIC_OR_DEPARTMENT_OTHER)
Admission: EM | Admit: 2022-08-26 | Discharge: 2022-08-26 | Disposition: A | Discharge status: Home / Self Care | Payer: Medicaid Other | Attending: Emergency Medicine | Admitting: Emergency Medicine

## 2022-08-26 ENCOUNTER — Encounter (HOSPITAL_BASED_OUTPATIENT_CLINIC_OR_DEPARTMENT_OTHER): Payer: Self-pay | Admitting: Emergency Medicine

## 2022-08-26 ENCOUNTER — Telehealth: Payer: Self-pay

## 2022-08-26 DIAGNOSIS — R202 Paresthesia of skin: Secondary | ICD-10-CM | POA: Diagnosis present

## 2022-08-26 LAB — BASIC METABOLIC PANEL
Anion gap: 9 (ref 5–15)
BUN: 9 mg/dL (ref 6–20)
CO2: 25 mmol/L (ref 22–32)
Calcium: 9.3 mg/dL (ref 8.9–10.3)
Chloride: 104 mmol/L (ref 98–111)
Creatinine, Ser: 0.69 mg/dL (ref 0.44–1.00)
GFR, Estimated: >60 mL/min (ref >60)
Glucose, Bld: 67 mg/dL — ABNORMAL LOW (ref 70–99)
Potassium: 3.8 mmol/L (ref 3.5–5.1)
Sodium: 138 mmol/L (ref 135–145)

## 2022-08-26 LAB — CBC WITH DIFFERENTIAL/PLATELET
Abs Immature Granulocytes: 0.03 10*3/uL (ref 0.00–0.07)
Basophils Absolute: 0 10*3/uL (ref 0.0–0.1)
Basophils Relative: 0 %
Eosinophils Absolute: 0.1 10*3/uL (ref 0.0–0.5)
Eosinophils Relative: 1 %
HCT: 36.3 % (ref 36.0–46.0)
Hemoglobin: 12.1 g/dL (ref 12.0–15.0)
Immature Granulocytes: 0 %
Lymphocytes Relative: 27 %
Lymphs Abs: 2.6 10*3/uL (ref 0.7–4.0)
MCH: 28.6 pg (ref 26.0–34.0)
MCHC: 33.3 g/dL (ref 30.0–36.0)
MCV: 85.8 fL (ref 80.0–100.0)
Monocytes Absolute: 0.8 10*3/uL (ref 0.1–1.0)
Monocytes Relative: 8 %
Neutro Abs: 6.2 10*3/uL (ref 1.7–7.7)
Neutrophils Relative %: 64 %
Platelets: 320 10*3/uL (ref 150–400)
RBC: 4.23 MIL/uL (ref 3.87–5.11)
RDW: 13.6 % (ref 11.5–15.5)
WBC: 9.6 10*3/uL (ref 4.0–10.5)
nRBC: 0 % (ref 0.0–0.2)

## 2022-08-26 LAB — MAGNESIUM: Magnesium: 2 mg/dL (ref 1.7–2.4)

## 2022-08-26 LAB — HCG, SERUM, QUALITATIVE: Preg, Serum: NEGATIVE

## 2022-08-26 NOTE — ED Notes (Signed)
Pt given discharge instructions. Opportunities given for questions. Pt verbalizes understanding. Shakaria Raphael R, RN 

## 2022-08-26 NOTE — ED Triage Notes (Addendum)
Pt arrives to ED with c/o right hand/arm numbness x6 months. She notes over the past two weeks she has felt general weakness. She notes that she had her vitamin B12 drawn a few weeks ago with her PCP from [facility] and it was critically low.

## 2022-08-26 NOTE — Telephone Encounter (Signed)
Pt set up an appointment for 1430 for "extremity weakness- loss of feeling in arm down to hand and fingers." This nurse is informed by Talmadge Chad, NP that it would be recommended for the pt to go to the emergency department for a full workup.   Pt is called over the phone by this nurse. Pt states numbness has been occurring for the past 2-3 weeks d/t "B12 being low and WBC being low." Pt informed that emergency department could do a full workup for the patient as this urgent care is limited in providing that workup. Pt was understanding.

## 2022-08-26 NOTE — Discharge Instructions (Signed)
Make sure to follow-up with your primary care provider, may take OTC supplementation of B12 given your history of deficiency to see if this helps your symptoms.  Return for new or worsening symptoms

## 2022-08-26 NOTE — ED Provider Notes (Signed)
[place] EMERGENCY DEPARTMENT AT [facility] Provider Note   CSN: [id number] Arrival date & time: 08/26/22  1416    History  Chief Complaint  Patient presents with   Numbness    Sucely Ziegler is a 32 y.o. female history of bipolar, ADHD here for evaluation of right hand and arm numbness over the last 6 months.  Started at her right hand and is now on her mid humerus.  No weakness.  No recent injury or trauma.  No neck pain, headache.  No personal or family history of demyelinating disorder.  No headache, blurred vision, eye pain, chest pain, shortness of breath, abdominal pain, numbness or weakness.  No overlying skin changes to extremities.  States she was seen by PCP at [facility] 3 weeks ago and was told that her B12 was low.  She has not gotten any injections and has not supplemented over-the-counter.  No change in her diet.  Does note that she smokes "a lot" of marijuana, most recent yesterday.  HPI     Home Medications Prior to Admission medications   Medication Sig Start Date End Date Taking? Authorizing Provider  albuterol (VENTOLIN HFA) 108 (90 Base) MCG/ACT inhaler Inhale 1-2 puffs into the lungs every 6 (six) hours as needed for wheezing or shortness of breath. 06/04/22   Wallis Bamberg, PA-C  amoxicillin-clavulanate (AUGMENTIN) 875-125 MG tablet Take 1 tablet by mouth 2 (two) times daily. 06/04/22   Wallis Bamberg, PA-C  cefdinir (OMNICEF) 300 MG capsule Take 1 capsule (300 mg total) by mouth 2 (two) times daily. 07/12/22   Wallis Bamberg, PA-C  diphenhydrAMINE HCl, Sleep, (UNISOM SLEEPMELTS) 25 MG TBDP Take 1-3 tablets (25-75 mg total) by mouth at bedtime. 07/20/22   Shanna Cisco, NP  fluconazole (DIFLUCAN) 150 MG tablet Take 1 tablet (150 mg total) by mouth every 3 (three) days. 06/04/22   Wallis Bamberg, PA-C  FLUoxetine (PROZAC) 20 MG capsule Take 1 capsule (20 mg total) by mouth daily. 07/20/22   Shanna Cisco, NP  HYDROcodone-acetaminophen (NORCO/VICODIN)  5-325 MG tablet Take 1-2 tablets by mouth every 6 (six) hours as needed. 02/26/20   Petrucelli, Pleas Koch, PA-C  HYDROcodone-acetaminophen (NORCO/VICODIN) 5-325 MG tablet Take 1 tablet by mouth every 6 (six) hours as needed for up to 15 doses. 07/17/21   Terald Sleeper, MD  ibuprofen (ADVIL) 800 MG tablet Take 1 tablet (800 mg total) by mouth 3 (three) times daily. 04/13/22   Raspet, Noberto Retort, PA-C  levocetirizine (XYZAL) 5 MG tablet Take 1 tablet (5 mg total) by mouth every evening. 07/12/22   Wallis Bamberg, PA-C  lurasidone (LATUDA) 80 MG TABS tablet Take 1 tablet (80 mg total) by mouth daily with breakfast. 07/20/22   Shanna Cisco, NP  metroNIDAZOLE (FLAGYL) 500 MG tablet Take 1 tablet (500 mg total) by mouth 2 (two) times daily. 05/05/22   Merrilee Jansky, MD  predniSONE (DELTASONE) 50 MG tablet Take 1 tablet (50 mg total) by mouth daily with breakfast. 07/12/22   Wallis Bamberg, PA-C  promethazine-dextromethorphan (PROMETHAZINE-DM) 6.25-15 MG/5ML syrup Take 5 mLs by mouth 3 (three) times daily as needed for cough. 06/04/22   Wallis Bamberg, PA-C  propranolol (INDERAL) 20 MG tablet TAKE ONE TABLET BY MOUTH TWICE A DAY 05/19/22   Ocie Doyne, MD  rizatriptan (MAXALT) 5 MG tablet TAKE ONE TABLET BY MOUTH AS NEEDED FOR MIGRAINE. MAY REPEAT IN TWO HOURS IF NEEDED 05/19/22   Ocie Doyne, MD  Allergies    Patient has no known allergies.    Review of Systems   Review of Systems  Constitutional: Negative.   HENT: Negative.    Respiratory: Negative.    Cardiovascular: Negative.   Gastrointestinal: Negative.   Genitourinary: Negative.   Musculoskeletal: Negative.   Skin: Negative.   Neurological:  Positive for numbness. Negative for dizziness, tremors, seizures, syncope, facial asymmetry, speech difficulty, weakness, light-headedness and headaches.  All other systems reviewed and are negative.   Physical Exam Updated Vital Signs BP 126/79 (BP Location: Left Arm)   Pulse 79   Temp 98.6  F (37 C) (Oral)   Resp 18   SpO2 100%  Physical Exam Vitals and nursing note reviewed.  Constitutional:      General: She is not in acute distress.    Appearance: She is well-developed. She is not ill-appearing or toxic-appearing.  HENT:     Head: Normocephalic and atraumatic.     Nose: Nose normal.     Mouth/Throat:     Mouth: Mucous membranes are moist.  Eyes:     Pupils: Pupils are equal, round, and reactive to light.  Cardiovascular:     Rate and Rhythm: Normal rate.     Pulses: Normal pulses.          Radial pulses are 2+ on the right side and 2+ on the left side.     Heart sounds: Normal heart sounds.  Pulmonary:     Effort: Pulmonary effort is normal. No respiratory distress.     Breath sounds: Normal breath sounds.  Abdominal:     General: Bowel sounds are normal. There is no distension.     Palpations: Abdomen is soft.  Musculoskeletal:        General: Normal range of motion.     Cervical back: Normal range of motion.     Comments: No bony tenderness, full range of motion, compartment soft.  No midline C/T/L tenderness  Skin:    General: Skin is warm and dry.     Capillary Refill: Capillary refill takes less than 2 seconds.     Comments: No edema, erythema or warmth, fluctuance or induration.  Neurological:     General: No focal deficit present.     Mental Status: She is alert and oriented to person, place, and time.     Cranial Nerves: Cranial nerves 2-12 are intact.     Sensory: Sensation is intact.     Motor: Motor function is intact.     Coordination: Coordination is intact.     Gait: Gait is intact.     Comments:  CN 2-12 grossly intact Intact sensation Equal strength Ambulatory without ataxic gait  Psychiatric:        Mood and Affect: Mood normal.    ED Results / Procedures / Treatments   Labs (all labs ordered are listed, but only abnormal results are displayed) Labs Reviewed  BASIC METABOLIC PANEL - Abnormal; Notable for the following  components:      Result Value   Glucose, Bld 67 (*)    All other components within normal limits  CBC WITH DIFFERENTIAL/PLATELET  MAGNESIUM  HCG, SERUM, QUALITATIVE    EKG None  Radiology CT Cervical Spine Wo Contrast  Result Date: 08/26/2022 CLINICAL DATA:  Right hand and arm numbness for 6 months. Remarkable on MR here CT descending colon of help with EXAM: CT CERVICAL SPINE WITHOUT CONTRAST TECHNIQUE: Multidetector CT imaging of the cervical spine was performed without intravenous  contrast. Multiplanar CT image reconstructions were also generated. RADIATION DOSE REDUCTION: This exam was performed according to the departmental dose-optimization program which includes automated exposure control, adjustment of the mA and/or kV according to patient size and/or use of iterative reconstruction technique. COMPARISON:  None Available. FINDINGS: Alignment: Mild reversal of the normal cervical lordosis could be due to positioning, muscle spasm or pain. The cervical vertebral bodies are normally aligned. Skull base and vertebrae: No acute fracture. No primary bone lesion or focal pathologic process. Soft tissues and spinal canal: No prevertebral fluid or swelling. No visible canal hematoma. Disc levels: The spinal canal is fairly generous. No large disc protrusions, spinal or foraminal stenosis. Upper chest: The lung apices are not included. Other: No neck mass, adenopathy or hematoma. IMPRESSION: 1. Mild reversal of the normal cervical lordosis could be due to positioning, muscle spasm or pain. 2. No acute bony findings. 3. No large disc protrusions, spinal or foraminal stenosis. Electronically Signed   By: Rudie Meyer M.D.   On: 08/26/2022 16:55    Procedures Ultrasound ED Peripheral IV (Provider)  Date/Time: 08/26/2022 4:08 PM  Performed by: Linwood Dibbles, PA-C Authorized by: Linwood Dibbles, PA-C   Procedure details:    Indications: poor IV access     Skin Prep: chlorhexidine gluconate      Location:  Right AC   Angiocath:  20 G   Bedside Ultrasound Guided: Yes     Images: archived     Patient tolerated procedure without complications: Yes     Dressing applied: Yes       Medications Ordered in ED Medications - No data to display  ED Course/ Medical Decision Making/ A&P    32 year old here for evaluation of paresthesias to right upper extremity over the last 6 months.  No recent injury or trauma.  No weakness.  Initially started at hand and now is in her biceps.  No weakness.  No headache, vision changes, chest pain, shortness of breath, or abnormalities.  Personal or family history of demyelinating disease.  She has a nonfocal neuroexam without deficit.  Midline cervical tenderness.  Does note she has a history of B12 deficiency however is not on any supplementation  Labs and imaging personally viewed and interpreted:  CBC without leukocytosis BMP glucose 67 Mag 2.0 Preg neg CT cervical without IMPRESSION:  1. Mild reversal of the normal cervical lordosis could be due to  positioning, muscle spasm or pain.  2. No acute bony findings.  3. No large disc protrusions, spinal or foraminal stenosis.   Reassessed patient.  We discussed her labs and imaging.  Unclear etiology.  Will have her follow-up outpatient.  She has no evidence of disc protrusion or spinal stenosis on her CT scan.  She was encouraged to follow-up with her PCP if symptoms do not improve for possible MRI which would not have available at this facility.  I do not feel she needs emergent transfer to [facility] for this tonight given her symptoms have been occurring over the last 6 months.  The patient has been appropriately medically screened and/or stabilized in the ED. I have low suspicion for any other emergent medical condition which would require further screening, evaluation or treatment in the ED or require inpatient management.  Patient is hemodynamically stable and in no acute distress.  Patient  able to ambulate in department prior to ED.  Evaluation does not show acute pathology that would require ongoing or additional emergent interventions while in the  emergency department or further inpatient treatment.  I have discussed the diagnosis with the patient and answered all questions.  Pain is been managed while in the emergency department and patient has no further complaints prior to discharge.  Patient is comfortable with plan discussed in room and is stable for discharge at this time.  I have discussed strict return precautions for returning to the emergency department.  Patient was encouraged to follow-up with PCP/specialist refer to at discharge.                            Medical Decision Making Amount and/or Complexity of Data Reviewed External Data Reviewed: labs, radiology and notes. Labs: ordered. Decision-making details documented in ED Course. Radiology: ordered and independent interpretation performed. Decision-making details documented in ED Course.  Risk OTC drugs. Prescription drug management. Decision regarding hospitalization. Diagnosis or treatment significantly limited by social determinants of health.          Final Clinical Impression(s) / ED Diagnoses Final diagnoses:  Paresthesias    Rx / DC Orders ED Discharge Orders     None         Reonna Finlayson A, PA-C 08/26/22 1748    Tegeler, Canary Brim, MD 08/27/22 0001

## 2022-09-29 ENCOUNTER — Ambulatory Visit (HOSPITAL_COMMUNITY)
Admission: RE | Admit: 2022-09-29 | Discharge: 2022-09-29 | Disposition: A | Discharge status: Home / Self Care | Payer: Medicaid Other | Source: Ambulatory Visit | Attending: Family Medicine | Admitting: Family Medicine

## 2022-09-29 ENCOUNTER — Ambulatory Visit (INDEPENDENT_AMBULATORY_CARE_PROVIDER_SITE_OTHER): Payer: Medicaid Other

## 2022-09-29 ENCOUNTER — Encounter (HOSPITAL_COMMUNITY): Payer: Self-pay

## 2022-09-29 VITALS — BP 128/78 | HR 72 | Temp 98.7°F | Resp 18

## 2022-09-29 DIAGNOSIS — R053 Chronic cough: Secondary | ICD-10-CM

## 2022-09-29 DIAGNOSIS — K0889 Other specified disorders of teeth and supporting structures: Secondary | ICD-10-CM

## 2022-09-29 DIAGNOSIS — J4521 Mild intermittent asthma with (acute) exacerbation: Secondary | ICD-10-CM

## 2022-09-29 MED ORDER — AMOXICILLIN-POT CLAVULANATE 875-125 MG PO TABS: 1.0000 | ORAL_TABLET | Freq: Two times a day (BID) | ORAL | 0 refills | Status: DC

## 2022-09-29 MED ORDER — DEXAMETHASONE SODIUM PHOSPHATE 10 MG/ML IJ SOLN
10.0000 mg | Freq: Once | INTRAMUSCULAR | Status: AC
  Administered 2022-09-29: 10 mg via INTRAMUSCULAR

## 2022-09-29 MED ORDER — DEXAMETHASONE SODIUM PHOSPHATE 10 MG/ML IJ SOLN
INTRAMUSCULAR | Status: AC
  Filled 2022-09-29: qty 1

## 2022-09-29 NOTE — Discharge Instructions (Addendum)
Your chest x-ray looked ok. Begin taking the below antibiotic for your tooth. Meds ordered this encounter  Medications   amoxicillin-clavulanate (AUGMENTIN) 875-125 MG tablet    Sig: Take 1 tablet by mouth every 12 (twelve) hours.    Dispense:  20 tablet    Refill:  0

## 2022-09-29 NOTE — ED Triage Notes (Signed)
Pt states she has dental pain at the bottom right side  x6 months worse lately. She took a mobic and flexeril last night for the pain.    She states she has a cough and congestion She has been taking OTC cough meds without relief. She also had some extra antibiotic and steroids left she has been trying to take. She has been to [facility] for this and she states that they said she was ok.

## 2022-10-02 NOTE — ED Provider Notes (Signed)
[facility]   [id number] 09/29/22 Arrival Time: 1438  ASSESSMENT & PLAN:  1. Persistent cough for 3 weeks or longer   2. Mild intermittent asthma with acute exacerbation   3. Pain, dental    No respiratory distress.  Will treat for dental infection. Prefers OTC cough medicaitons. Meds ordered this encounter  Medications   amoxicillin-clavulanate (AUGMENTIN) 875-125 MG tablet    Sig: Take 1 tablet by mouth every 12 (twelve) hours.    Dispense:  20 tablet    Refill:  0   dexamethasone (DECADRON) injection 10 mg   Asthma precautions given. I have personally viewed and independently interpreted the imaging studies ordered this visit. No acute changes on CXR.   Recommend:  Follow-up Information     Center, [facility].   Why: If worsening or failing to improve as anticipated. Contact information: [address] [place] [address] [phone]                 Reviewed expectations re: course of current medical issues. Questions answered. Outlined signs and symptoms indicating need for more acute intervention. Patient verbalized understanding. After Visit Summary given.  SUBJECTIVE: History from: patient.  Miranda Robertson is a 32 y.o. female who presents with complaint of dental pain; lower right side; x6 months; worse over past few days. She took a mobic and flexeril last night for the pain. Also states she has a cough and congestion; past week. She has been taking OTC cough meds without relief. She has been to [facility] for this and she states that they said she was ok.  Denies fever/SOB/CP.  Social History   Tobacco Use  Smoking Status Every Day   Types: Cigars  Smokeless Tobacco Never    OBJECTIVE:  Vitals:   09/29/22 1510  BP: 128/78  Pulse: 72  Resp: 18  Temp: 98.7 F (37.1 C)  TempSrc: Oral  SpO2: 96%     General appearance: alert; NAD HEENT: [place]; AT; with mild nasal congestion Neck: supple without LAD Cv: RRR  without murmer Lungs: unlabored respirations, moderate bilateral expiratory wheezing; cough: moderate; no significant respiratory distress Skin: warm and dry Psychological: alert and cooperative; normal mood and affect  Imaging: DG Chest 2 View  Result Date: 09/29/2022 CLINICAL DATA:  Cough for 3 weeks. EXAM: CHEST - 2 VIEW COMPARISON:  Chest x-ray 05/22/2019 and older FINDINGS: No consolidation, pneumothorax or effusion. No edema. Normal cardiopericardial silhouette. Eventration of the right hemidiaphragm. Air-fluid level along the stomach beneath the left hemidiaphragm. IMPRESSION: No acute cardiopulmonary disease. Electronically Signed   By: Karen Kays M.D.   On: 09/29/2022 16:47    No Known Allergies  Past Medical History:  Diagnosis Date   Asthma    Childhood   Depression    HSV (herpes simplex virus) infection    Mental disorder    bipolar, depression, mild menatl retardation, suicidal thoughts in 2010   Family History  Problem Relation Age of Onset   Headache Father    Headache Sister    Social History   Socioeconomic History   Marital status: Single    Spouse name: Not on file   Number of children: Not on file   Years of education: Not on file   Highest education level: Not on file  Occupational History   Not on file  Tobacco Use   Smoking status: Every Day    Types: Cigars   Smokeless tobacco: Never  Vaping Use   Vaping Use: Never used  Substance and Sexual Activity   Alcohol use: Yes    Comment: occassonal   Drug use: Yes    Frequency: 7.0 times per week    Types: Marijuana   Sexual activity: Yes    Birth control/protection: Condom  Other Topics Concern   Not on file  Social History Narrative   Right handed   Caffeine intake 8 cups a week   Social Determinants of Health   Financial Resource Strain: Not on file  Food Insecurity: Not on file  Transportation Needs: Not on file  Physical Activity: Not on file  Stress: Not on file  Social  Connections: Not on file  Intimate Partner Violence: Not on file             [place], MD 10/02/22 [phone]

## 2022-10-05 ENCOUNTER — Encounter (HOSPITAL_COMMUNITY): Payer: Medicaid Other | Admitting: Psychiatry

## 2022-10-06 ENCOUNTER — Ambulatory Visit (HOSPITAL_COMMUNITY)
Admission: RE | Admit: 2022-10-06 | Discharge: 2022-10-06 | Disposition: A | Discharge status: Home / Self Care | Payer: Medicaid Other | Source: Ambulatory Visit | Attending: Physician Assistant | Admitting: Physician Assistant

## 2022-10-06 ENCOUNTER — Encounter (HOSPITAL_COMMUNITY): Payer: Self-pay

## 2022-10-06 VITALS — BP 128/90 | HR 86 | Temp 98.2°F | Resp 16

## 2022-10-06 DIAGNOSIS — B370 Candidal stomatitis: Secondary | ICD-10-CM | POA: Diagnosis not present

## 2022-10-06 DIAGNOSIS — R1030 Lower abdominal pain, unspecified: Secondary | ICD-10-CM | POA: Insufficient documentation

## 2022-10-06 DIAGNOSIS — K029 Dental caries, unspecified: Secondary | ICD-10-CM | POA: Diagnosis not present

## 2022-10-06 DIAGNOSIS — K0889 Other specified disorders of teeth and supporting structures: Secondary | ICD-10-CM | POA: Diagnosis not present

## 2022-10-06 LAB — POCT URINALYSIS DIP (MANUAL ENTRY)
Blood, UA: NEGATIVE
Glucose, UA: NEGATIVE mg/dL
Leukocytes, UA: NEGATIVE
Nitrite, UA: NEGATIVE
Protein Ur, POC: NEGATIVE mg/dL
Spec Grav, UA: >=1.03 — AB (ref 1.010–1.025)
Urobilinogen, UA: 0.2 E.U./dL
pH, UA: 5 (ref 5.0–8.0)

## 2022-10-06 LAB — POCT URINE PREGNANCY: Preg Test, Ur: NEGATIVE

## 2022-10-06 MED ORDER — NYSTATIN 100000 UNIT/ML MT SUSP: 5.0000 mL | Freq: Four times a day (QID) | OROMUCOSAL | 0 refills | Status: DC

## 2022-10-06 MED ORDER — MAGIC MOUTHWASH W/LIDOCAINE: ORAL | 0 refills | Status: DC

## 2022-10-06 NOTE — ED Provider Notes (Addendum)
[facility]    CSN: [id number] Arrival date & time: 10/06/22  1817      History   Chief Complaint Chief Complaint  Patient presents with   Ear Pain   Dental Pain   Abdominal Pain    HPI Miranda Robertson is a 32 y.o. female.   HPI  She reports her ear and throat pain started after she was seen in UC  She has tried to reach out to several dental offices that were provided at previous visit- she reports they do not accept her insurance or she was not able to reach someone She has been taking her abx as directed but does not think it is helping    She reports she has stopped smoking  Abdominal pain- she reports pain in suprapubic area and discomfort in vulvovaginal area  She denies odor, changes to vaginal discharge or bleeding     Past Medical History:  Diagnosis Date   Asthma    Childhood   Depression    HSV (herpes simplex virus) infection    Mental disorder    bipolar, depression, mild menatl retardation, suicidal thoughts in 2010    Patient Active Problem List   Diagnosis Date Noted   Bipolar 1 disorder, mixed, partial remission (HCC) 04/28/2021   Bipolar I disorder (HCC) 11/27/2019   Attention deficit hyperactivity disorder (ADHD), 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   Previous cesarean section 05/22/2014   Gestational hypertension without significant proteinuria, antepartum 05/16/2014   Gestational hypertension    H/O cesarean section complicating pregnancy 05/06/2014   Pregnant 05/06/2014   Drug use affecting pregnancy in third trimester 04/30/2014   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   Lost custody of children 04/30/2014   Previous cesarean delivery, antepartum 04/30/2014    Past Surgical History:  Procedure Laterality Date   CESAREAN SECTION  06/09/12   FTP   CESAREAN SECTION N/A 05/21/2014   Procedure: CESAREAN SECTION;  Surgeon: Reva Bores, MD;  Location: [facility];  Service: Obstetrics;  Laterality: N/A;    CHOLECYSTECTOMY     WISDOM TOOTH EXTRACTION      OB History     Gravida  2   Para  2   Term  2   Preterm      AB      Living  2      SAB      IAB      Ectopic      Multiple  0   Live Births  2            Home Medications    Prior to Admission medications   Medication Sig Start Date End Date Taking? Authorizing Provider  amoxicillin-clavulanate (AUGMENTIN) 875-125 MG tablet Take 1 tablet by mouth every 12 (twelve) hours. 09/29/22  Yes Mardella Layman, MD  CETIRIZINE HCL PO Take by mouth.   Yes [provider]  diphenhydrAMINE HCl (BENADRYL PO) Take by mouth.   Yes [provider]  FLUoxetine (PROZAC) 20 MG capsule Take 1 capsule (20 mg total) by mouth daily. 07/20/22  Yes Toy Cookey E, NP  lurasidone (LATUDA) 80 MG TABS tablet Take 1 tablet (80 mg total) by mouth daily with breakfast. 07/20/22  Yes Toy Cookey E, NP  magic mouthwash (nystatin, lidocaine, diphenhydrAMINE) suspension Take 5 mLs by mouth 4 (four) times daily. Swish in mouth for 30 seconds then spit out. 10/06/22  Yes Ferol Laiche E, PA-C  Montelukast Sodium (SINGULAIR PO) Take by mouth.  Yes [provider]  propranolol (INDERAL) 20 MG tablet TAKE ONE TABLET BY MOUTH TWICE A DAY 05/19/22  Yes Ocie Doyne, MD  rizatriptan (MAXALT) 5 MG tablet TAKE ONE TABLET BY MOUTH AS NEEDED FOR MIGRAINE. MAY REPEAT IN TWO HOURS IF NEEDED 05/19/22  Yes Ocie Doyne, MD    Family History Family History  Problem Relation Age of Onset   Headache Father    Headache Sister     Social History Social History   Tobacco Use   Smoking status: Never   Smokeless tobacco: Never  Vaping Use   Vaping Use: Never used  Substance Use Topics   Alcohol use: Yes    Comment: occasionally   Drug use: Not Currently    Types: Marijuana    Comment: not while ill     Allergies   Patient has no known allergies.   Review of Systems Review of Systems  HENT:  Positive for dental  problem and ear pain (both but right one hurts worse).   Gastrointestinal:  Positive for abdominal pain (suprapubic pain).  Genitourinary:  Negative for dysuria, flank pain, frequency, vaginal bleeding, vaginal discharge and vaginal pain.     Physical Exam Triage Vital Signs ED Triage Vitals [10/06/22 1829]  Enc Vitals Group     BP (!) 128/90     Pulse Rate 86     Resp 16     Temp 98.2 F (36.8 C)     Temp Source Oral     SpO2 97 %     Weight      Height      Head Circumference      Peak Flow      Pain Score 10     Pain Loc      Pain Edu?      Excl. in GC?    No data found.  Updated Vital Signs BP (!) 128/90   Pulse 86   Temp 98.2 F (36.8 C) (Oral)   Resp 16   LMP 09/17/2022 (Exact Date)   SpO2 97%   Visual Acuity Right Eye Distance:   Left Eye Distance:   Bilateral Distance:    Right Eye Near:   Left Eye Near:    Bilateral Near:     Physical Exam Vitals reviewed.  Constitutional:      General: She is awake.     Appearance: Normal appearance. She is well-developed and well-groomed.  HENT:     Head: Normocephalic and atraumatic.     Right Ear: Hearing, tympanic membrane and ear canal normal.     Left Ear: Hearing, tympanic membrane and ear canal normal.     Nose: Nose normal.     Mouth/Throat:     Lips: Pink.     Mouth: Mucous membranes are moist.     Dentition: Abnormal dentition. Dental caries present. No gingival swelling, dental abscesses or gum lesions.     Tongue: Lesions present. Tongue does not deviate from midline.     Pharynx: Oropharynx is clear. Uvula midline. No oropharyngeal exudate or posterior oropharyngeal erythema.     Tonsils: No tonsillar exudate or tonsillar abscesses.     Comments: White plaque noted on tongue  Eyes:     General: Lids are normal. Gaze aligned appropriately.  Neck:     Trachea: Phonation normal.     Comments: Tenderness along right submandibular area - no focal lymphadenopathy or swelling of salivary glands on  palpation  Cardiovascular:     Rate  and Rhythm: Normal rate and regular rhythm.     Heart sounds: Normal heart sounds. No murmur heard.    No friction rub. No gallop.  Pulmonary:     Effort: Pulmonary effort is normal.     Breath sounds: No decreased air movement. No decreased breath sounds, wheezing, rhonchi or rales.  Abdominal:     General: Abdomen is flat. Bowel sounds are normal.     Palpations: Abdomen is soft.     Tenderness: There is abdominal tenderness in the periumbilical area and suprapubic area. There is no right CVA tenderness, left CVA tenderness, guarding or rebound. Negative signs include Murphy's sign, Rovsing's sign, McBurney's sign, psoas sign and obturator sign.    Musculoskeletal:     Cervical back: Normal range of motion and neck supple.  Lymphadenopathy:     Head:     Right side of head: No submental, submandibular or preauricular adenopathy.     Left side of head: No submental, submandibular or preauricular adenopathy.     Cervical:     Right cervical: No superficial or posterior cervical adenopathy.    Left cervical: No superficial or posterior cervical adenopathy.     Upper Body:     Right upper body: No supraclavicular adenopathy.     Left upper body: No supraclavicular adenopathy.  Neurological:     Mental Status: She is alert.  Psychiatric:        Behavior: Behavior is cooperative.      UC Treatments / Results  Labs (all labs ordered are listed, but only abnormal results are displayed) Labs Reviewed  POCT URINALYSIS DIP (MANUAL ENTRY) - Abnormal; Notable for the following components:      Result Value   Clarity, UA hazy (*)    Bilirubin, UA small (*)    Ketones, POC UA trace (5) (*)    Spec Grav, UA >=1.030 (*)    All other components within normal limits  POCT URINE PREGNANCY  CERVICOVAGINAL ANCILLARY ONLY    EKG   Radiology No results found.  Procedures Procedures (including critical care time)  Medications Ordered in  UC Medications - No data to display  Initial Impression / Assessment and Plan / UC Course  I have reviewed the triage vital signs and the nursing notes.  Pertinent labs & imaging results that were available during my care of the patient were reviewed by me and considered in my medical decision making (see chart for details).      Final Clinical Impressions(s) / UC Diagnoses   Final diagnoses:  Thrush, oral  Dental caries noted on examination  Pain, dental  Lower abdominal pain   Patient presents with multiple complaints- dental pain, sore throat, lower abdominal pain and tenderness in vulvovaginal region Suspect oral pain and dental pain is secondary to severe caries and oral thrush  - no obvious evidence of dental abscess at this time Recommend she continues with current abx- will provide magic mouth wash script along with Dental service referral to facilitate her being evaluated promptly Suspect her abdominal pain is likely secondary to abx-induced yeast infection- cervicovaginal swab collected today for evaluation UA was negative for leukocytes or blood - lessening suspicions for current UTI, results reviewed with patient during apt Recommend she continues with antihistamines, Tylenol and Ibuprofen and finishes Augmentin pending dental evaluation Results of cervicovaginal swab to dictate further management  Follow up as needed for persistent or progressing symptoms       Discharge Instructions  I have placed a referral to dental services. Someone should call you soon to help set up and apt.  Please continue your antibiotic You can use lidocaine mouth wash, Biotene mouth wash to help with discomfort You can use Tylenol and Ibuprofen for pain management until you are seen by a dentist      ED Prescriptions     Medication Sig Dispense Auth. Provider   magic mouthwash w/lidocaine SOLN  (Status: Discontinued) Swish, gargle, and spit one to two teaspoonfuls every six  hours as needed. Shake well before using. 120 mL Kiandra Sanguinetti E, PA-C   magic mouthwash (nystatin, lidocaine, diphenhydrAMINE) suspension Take 5 mLs by mouth 4 (four) times daily. Swish in mouth for 30 seconds then spit out. 180 mL Fiza Nation E, PA-C      PDMP not reviewed this encounter.  Jacquelin Hawking, MHS, PA-C     Haeven Nickle, Oswaldo Conroy, PA-C 10/06/22 1927    Providence Crosby, PA-C 10/06/22 1929

## 2022-10-06 NOTE — ED Triage Notes (Addendum)
Pt was seen 6/12 in [facility] for cough and dental pain. States cough has resolved, but now having right ear pain and describes right eustachian tube discomfort and cold sensation. Also states she has tried to contact dentist from list provided at [facility], but has not been successful. States continues to take abx, but c/o significant right lower tooth pain continuing. Also c/o intermittent suprapubic pain onset 3 days ago along with vaginal discomfort. Denies any urinary sxs or vaginal discharge.

## 2022-10-06 NOTE — Discharge Instructions (Addendum)
I have placed a referral to dental services. Someone should call you soon to help set up and apt.  Please continue your antibiotic You can use lidocaine mouth wash, Biotene mouth wash to help with discomfort You can use Tylenol and Ibuprofen for pain management until you are seen by a dentist

## 2022-10-07 LAB — CERVICOVAGINAL ANCILLARY ONLY
Bacterial Vaginitis (gardnerella): POSITIVE — AB
Candida Glabrata: POSITIVE — AB
Candida Vaginitis: POSITIVE — AB
Chlamydia: NEGATIVE
Comment: NEGATIVE
Comment: NEGATIVE
Comment: NEGATIVE
Comment: NEGATIVE
Comment: NEGATIVE
Comment: NORMAL
Neisseria Gonorrhea: NEGATIVE
Trichomonas: POSITIVE — AB

## 2022-10-08 ENCOUNTER — Telehealth (HOSPITAL_COMMUNITY): Payer: Self-pay | Admitting: Emergency Medicine

## 2022-10-08 MED ORDER — METRONIDAZOLE 500 MG PO TABS: 500.0000 mg | ORAL_TABLET | Freq: Two times a day (BID) | ORAL | 0 refills | Status: DC

## 2022-10-08 MED ORDER — FLUCONAZOLE 150 MG PO TABS: 150.0000 mg | ORAL_TABLET | Freq: Once | ORAL | 0 refills | Status: AC

## 2022-10-18 ENCOUNTER — Telehealth: Payer: Self-pay | Admitting: Emergency Medicine

## 2022-10-18 NOTE — Telephone Encounter (Signed)
Message from front desk that patient was looking for a call from a nurse Attempted to reach patient x 1, LVM, but it ended half way through

## 2022-10-22 ENCOUNTER — Ambulatory Visit: Payer: Self-pay

## 2022-10-24 ENCOUNTER — Ambulatory Visit (HOSPITAL_COMMUNITY)
Admission: RE | Admit: 2022-10-24 | Discharge: 2022-10-24 | Disposition: A | Discharge status: Home / Self Care | Payer: MEDICAID | Source: Ambulatory Visit | Attending: Emergency Medicine | Admitting: Emergency Medicine

## 2022-10-24 ENCOUNTER — Encounter (HOSPITAL_COMMUNITY): Payer: Self-pay

## 2022-10-24 VITALS — BP 138/89 | HR 97 | Temp 98.2°F | Resp 18

## 2022-10-24 DIAGNOSIS — Z113 Encounter for screening for infections with a predominantly sexual mode of transmission: Secondary | ICD-10-CM | POA: Insufficient documentation

## 2022-10-24 DIAGNOSIS — N760 Acute vaginitis: Secondary | ICD-10-CM | POA: Diagnosis not present

## 2022-10-24 DIAGNOSIS — B3731 Acute candidiasis of vulva and vagina: Secondary | ICD-10-CM | POA: Insufficient documentation

## 2022-10-24 LAB — POCT URINALYSIS DIP (MANUAL ENTRY)
Bilirubin, UA: NEGATIVE
Blood, UA: NEGATIVE
Glucose, UA: NEGATIVE mg/dL
Ketones, POC UA: NEGATIVE mg/dL
Leukocytes, UA: NEGATIVE
Nitrite, UA: NEGATIVE
Protein Ur, POC: NEGATIVE mg/dL
Spec Grav, UA: 1.025 (ref 1.010–1.025)
Urobilinogen, UA: 0.2 E.U./dL
pH, UA: 6 (ref 5.0–8.0)

## 2022-10-24 LAB — POCT URINE PREGNANCY: Preg Test, Ur: NEGATIVE

## 2022-10-24 LAB — HIV ANTIBODY (ROUTINE TESTING W REFLEX): HIV Screen 4th Generation wRfx: NONREACTIVE

## 2022-10-24 NOTE — Discharge Instructions (Addendum)
We have tested you for sexually transmitted infections today as well as bacterial vaginosis and yeast.  Our staff will reach out to you if anything results is positive.  Please abstain from intercourse until all results are received, if positive, please abstain from intercourse until treatment is completed and your partner receives treatment as well.  Return to clinic for any new or urgent symptoms.

## 2022-10-24 NOTE — ED Provider Notes (Signed)
[facility]    CSN: [id number] Arrival date & time: 10/24/22  1308      History   Chief Complaint Chief Complaint  Patient presents with   Abdominal Pain    Entered by patient    HPI Miranda Robertson is a 32 y.o. female.   Patient presents to clinic for vaginal discomfort and irritation.  She denies any discharge or odor.  Denies dysuria, vaginal sores, lesions, flank pain or fevers.  Reports previously she was with multiple partners, when she tested positive for an STI she decreased to 1 sexual partner.  Reports he told her that he got treatment.  She has been sexually active with this 1 partner only.  She would like testing for HIV and syphilis today.   The history is provided by the patient and medical records.  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   Previous cesarean section 05/22/2014   Gestational hypertension without significant proteinuria, antepartum 05/16/2014   Gestational hypertension    H/O cesarean section complicating pregnancy 05/06/2014   Pregnant 05/06/2014   Drug use affecting pregnancy in third trimester 04/30/2014   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   Lost custody of children 04/30/2014   Previous cesarean delivery, antepartum 04/30/2014   Depressive disorder, 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 Authorizing Provider  amoxicillin-clavulanate (AUGMENTIN) 875-125 MG tablet Take 1 tablet by mouth every 12 (twelve) hours. 09/29/22   Mardella Layman, MD  ARIPiprazole (ABILIFY) 5 MG tablet Take 5 mg by mouth as needed. Take 5 mg by mouth Once Daily. Indications: additional treatment for major depressive disorder 05/18/13   [provider]  CETIRIZINE HCL PO Take by mouth.    [provider]  citalopram (CELEXA) 20 MG tablet Take 20 mg by mouth daily. 05/18/13   [provider]  diphenhydrAMINE HCl (BENADRYL PO) Take by mouth.    [provider]  FLUoxetine (PROZAC) 20 MG capsule Take 1 capsule (20 mg total) by mouth daily. 07/20/22   Shanna Cisco, NP  lurasidone (LATUDA)  80 MG TABS tablet Take 1 tablet (80 mg total) by mouth daily with breakfast. 07/20/22   Shanna Cisco, NP  magic mouthwash (nystatin, lidocaine, diphenhydrAMINE) suspension Take 5 mLs by mouth 4 (four) times daily. Swish in mouth for 30 seconds then spit out. 10/06/22   Mecum, Erin E, PA-C  metroNIDAZOLE (FLAGYL) 500 MG tablet Take 1 tablet (500 mg total) by mouth 2 (two) times daily. 10/08/22   Merrilee Jansky, MD  Montelukast Sodium (SINGULAIR PO) Take by mouth.    [provider]  propranolol (INDERAL) 20 MG tablet TAKE ONE TABLET BY MOUTH TWICE A DAY 05/19/22   Ocie Doyne, MD  rizatriptan (MAXALT) 5 MG tablet TAKE ONE TABLET BY MOUTH AS NEEDED FOR MIGRAINE. MAY REPEAT IN TWO HOURS IF NEEDED 05/19/22   Ocie Doyne,  MD  traZODone (DESYREL) 50 MG tablet Take 50 mg by mouth at bedtime. 05/18/13   [provider]    Family History Family History  Problem Relation Age of Onset   Headache Father    Headache Sister     Social History Social History   Tobacco Use   Smoking status: Never   Smokeless tobacco: Never  Vaping Use   Vaping Use: Never used  Substance Use Topics   Alcohol use: Yes    Comment: occasionally   Drug use: Not Currently    Types: Marijuana    Comment: not while ill     Allergies   Patient has no known allergies.   Review of Systems Review of Systems  Constitutional:  Negative for fever.  Genitourinary:  Positive for vaginal pain. Negative for dysuria, flank pain, frequency, genital sores, vaginal bleeding and vaginal discharge.     Physical Exam Triage Vital Signs ED Triage Vitals [10/24/22 1322]  Enc Vitals Group     BP 138/89     Pulse Rate 97     Resp 18     Temp 98.2 F (36.8 C)     Temp Source Oral     SpO2 98 %     Weight      Height      Head Circumference      Peak Flow      Pain Score 5     Pain Loc      Pain Edu?      Excl. in GC?    No data found.  Updated Vital Signs BP 138/89 (BP Location: Left Arm)   Pulse 97   Temp 98.2 F (36.8 C) (Oral)   Resp 18   LMP 10/10/2022 (Exact Date)   SpO2 98%   Visual Acuity Right Eye Distance:   Left Eye Distance:   Bilateral Distance:    Right Eye Near:   Left Eye Near:    Bilateral Near:     Physical Exam Vitals and nursing note reviewed.  Constitutional:      Appearance: She is well-developed.  HENT:     Head: Normocephalic and atraumatic.     Mouth/Throat:     Mouth: Mucous membranes are moist.  Cardiovascular:     Rate and Rhythm: Normal rate.  Neurological:     Mental Status: She is alert.      UC Treatments / Results  Labs (all labs ordered are listed, but only abnormal results are displayed) Labs Reviewed  RPR  HIV ANTIBODY (ROUTINE TESTING W REFLEX)   POCT URINALYSIS DIP (MANUAL ENTRY)  POCT URINE PREGNANCY  CERVICOVAGINAL ANCILLARY ONLY    EKG  Radiology No results found.  Procedures Procedures (including critical care time)  Medications Ordered in UC Medications - No data to display  Initial Impression / Assessment and Plan / UC Course  I have reviewed the triage vital signs and the nursing notes.  Pertinent labs & imaging results that were available during my care of the patient were reviewed by me and considered in my medical decision making (see chart for details).  Vitals and triage reviewed, patient is hemodynamically stable.  Reports vaginal discomfort and irritation.  Patient is sexually active without protection.  Cytology screening as well as HIV and syphilis testing obtained.  Urinalysis unremarkable.  Urine pregnancy negative.  Plan of care, follow-up care and return precautions given, no questions at this time.     Final Clinical Impressions(s) / UC Diagnoses   Final diagnoses:  Acute vaginitis  Screening examination for sexually transmitted disease     Discharge Instructions      We have tested you for sexually transmitted infections today as well as bacterial vaginosis and yeast.  Our staff will reach out to you if anything results is positive.  Please abstain from intercourse until all results are received, if positive, please abstain from intercourse until treatment is completed and your partner receives treatment as well.  Return to clinic for any new or urgent symptoms.     ED Prescriptions   None    PDMP not reviewed this encounter.   Ariannah Arenson, [place], [place] 10/24/22 1350

## 2022-10-24 NOTE — ED Triage Notes (Signed)
PT states she is having nausea, low abdominal pain, and vaginal irritation x 4 days   Pt is concerned about STDS after last visit.

## 2022-10-25 ENCOUNTER — Telehealth (HOSPITAL_COMMUNITY): Payer: Self-pay | Admitting: Emergency Medicine

## 2022-10-25 LAB — CERVICOVAGINAL ANCILLARY ONLY
Bacterial Vaginitis (gardnerella): NEGATIVE
Candida Glabrata: NEGATIVE
Candida Vaginitis: POSITIVE — AB
Chlamydia: NEGATIVE
Comment: NEGATIVE
Comment: NEGATIVE
Comment: NEGATIVE
Comment: NEGATIVE
Comment: NEGATIVE
Comment: NORMAL
Neisseria Gonorrhea: NEGATIVE
Trichomonas: NEGATIVE

## 2022-10-25 LAB — RPR: RPR Ser Ql: NONREACTIVE

## 2022-10-25 NOTE — Telephone Encounter (Signed)
Patient called for results from yesterdays visit.  When I made her aware they were not available she states she also had a missed call from me on 7/1.  I reviewed notes in chart and told her I was returning her call.  Patient states she is still waiting on a referral for a dentist.  I explained that we don't do referrals at the urgent care, and that she could reach out to any clinic that can get her in or come by the clinic for a list of dentists that we normally provide.  She verbalized understanding.

## 2022-10-26 ENCOUNTER — Telehealth: Payer: Self-pay | Admitting: Emergency Medicine

## 2022-10-26 MED ORDER — FLUCONAZOLE 150 MG PO TABS: 150.0000 mg | ORAL_TABLET | Freq: Once | ORAL | 0 refills | Status: AC

## 2022-11-15 ENCOUNTER — Emergency Department (HOSPITAL_COMMUNITY): Payer: MEDICAID

## 2022-11-15 ENCOUNTER — Encounter (HOSPITAL_COMMUNITY): Payer: Self-pay

## 2022-11-15 ENCOUNTER — Other Ambulatory Visit: Payer: Self-pay

## 2022-11-15 ENCOUNTER — Emergency Department (HOSPITAL_COMMUNITY)
Admission: EM | Admit: 2022-11-15 | Discharge: 2022-11-15 | Disposition: A | Discharge status: Home / Self Care | Payer: MEDICAID | Attending: Emergency Medicine | Admitting: Emergency Medicine

## 2022-11-15 DIAGNOSIS — R0602 Shortness of breath: Secondary | ICD-10-CM | POA: Insufficient documentation

## 2022-11-15 LAB — CBC WITH DIFFERENTIAL/PLATELET
Abs Immature Granulocytes: 0.04 10*3/uL (ref 0.00–0.07)
Basophils Absolute: 0 10*3/uL (ref 0.0–0.1)
Basophils Relative: 0 %
Eosinophils Absolute: 0.1 10*3/uL (ref 0.0–0.5)
Eosinophils Relative: 1 %
HCT: 38.1 % (ref 36.0–46.0)
Hemoglobin: 12.1 g/dL (ref 12.0–15.0)
Immature Granulocytes: 1 %
Lymphocytes Relative: 27 %
Lymphs Abs: 2.3 10*3/uL (ref 0.7–4.0)
MCH: 27.9 pg (ref 26.0–34.0)
MCHC: 31.8 g/dL (ref 30.0–36.0)
MCV: 87.8 fL (ref 80.0–100.0)
Monocytes Absolute: 0.7 10*3/uL (ref 0.1–1.0)
Monocytes Relative: 8 %
Neutro Abs: 5.5 10*3/uL (ref 1.7–7.7)
Neutrophils Relative %: 63 %
Platelets: 301 10*3/uL (ref 150–400)
RBC: 4.34 MIL/uL (ref 3.87–5.11)
RDW: 13.5 % (ref 11.5–15.5)
WBC: 8.6 10*3/uL (ref 4.0–10.5)
nRBC: 0 % (ref 0.0–0.2)

## 2022-11-15 LAB — COMPREHENSIVE METABOLIC PANEL
ALT: 18 U/L (ref 0–44)
AST: 16 U/L (ref 15–41)
Albumin: 3.7 g/dL (ref 3.5–5.0)
Alkaline Phosphatase: 74 U/L (ref 38–126)
Anion gap: 7 (ref 5–15)
BUN: 9 mg/dL (ref 6–20)
CO2: 24 mmol/L (ref 22–32)
Calcium: 8.9 mg/dL (ref 8.9–10.3)
Chloride: 106 mmol/L (ref 98–111)
Creatinine, Ser: 0.62 mg/dL (ref 0.44–1.00)
GFR, Estimated: >60 mL/min (ref >60)
Glucose, Bld: 94 mg/dL (ref 70–99)
Potassium: 3.8 mmol/L (ref 3.5–5.1)
Sodium: 137 mmol/L (ref 135–145)
Total Bilirubin: 0.4 mg/dL (ref 0.3–1.2)
Total Protein: 7.1 g/dL (ref 6.5–8.1)

## 2022-11-15 LAB — LIPASE, BLOOD: Lipase: 40 U/L (ref 11–51)

## 2022-11-15 LAB — ETHANOL: Alcohol, Ethyl (B): <10 mg/dL (ref <10)

## 2022-11-15 MED ORDER — PREDNISONE 20 MG PO TABS: 40.0000 mg | ORAL_TABLET | Freq: Every day | ORAL | 0 refills | Status: DC

## 2022-11-15 MED ORDER — PREDNISONE 20 MG PO TABS
60.0000 mg | ORAL_TABLET | ORAL | Status: AC
  Administered 2022-11-15: 60 mg via ORAL
  Filled 2022-11-15: qty 3

## 2022-11-15 NOTE — ED Provider Notes (Signed)
[place] EMERGENCY DEPARTMENT AT [facility] Provider Note   CSN: [id number] Arrival date & time: 11/15/22  0900     History  Chief Complaint  Patient presents with   Chest Pain   Shortness of Breath   Numbness    Miranda Robertson is a 32 y.o. female.  HPI Patient presents with concern of chest tightness, shortness of breath.  Onset was at least 2 weeks ago, she has been seen, evaluated for dental concerns, is taking antibiotics.  No new fever, vomiting, syncope, change in characteristics.  She has been unable to see her primary care physician.    Home Medications Prior to Admission medications   Medication Sig Start Date End Date Taking? Authorizing Provider  predniSONE (DELTASONE) 20 MG tablet Take 2 tablets (40 mg total) by mouth daily with breakfast. For the next four days 11/15/22  Yes Gerhard Munch, MD  amoxicillin-clavulanate (AUGMENTIN) 875-125 MG tablet Take 1 tablet by mouth every 12 (twelve) hours. 09/29/22   Mardella Layman, MD  ARIPiprazole (ABILIFY) 5 MG tablet Take 5 mg by mouth as needed. Take 5 mg by mouth Once Daily. Indications: additional treatment for major depressive disorder 05/18/13   [provider]  CETIRIZINE HCL PO Take by mouth.    [provider]  citalopram (CELEXA) 20 MG tablet Take 20 mg by mouth daily. 05/18/13   [provider]  diphenhydrAMINE HCl (BENADRYL PO) Take by mouth.    [provider]  FLUoxetine (PROZAC) 20 MG capsule Take 1 capsule (20 mg total) by mouth daily. 07/20/22   Shanna Cisco, NP  lurasidone (LATUDA) 80 MG TABS tablet Take 1 tablet (80 mg total) by mouth daily with breakfast. 07/20/22   Shanna Cisco, NP  magic mouthwash (nystatin, lidocaine, diphenhydrAMINE) suspension Take 5 mLs by mouth 4 (four) times daily. Swish in mouth for 30 seconds then spit out. 10/06/22   Mecum, Erin E, PA-C  metroNIDAZOLE (FLAGYL) 500 MG tablet Take 1 tablet (500 mg total) by mouth 2 (two) times  daily. 10/08/22   Merrilee Jansky, MD  Montelukast Sodium (SINGULAIR PO) Take by mouth.    [provider]  propranolol (INDERAL) 20 MG tablet TAKE ONE TABLET BY MOUTH TWICE A DAY 05/19/22   Ocie Doyne, MD  rizatriptan (MAXALT) 5 MG tablet TAKE ONE TABLET BY MOUTH AS NEEDED FOR MIGRAINE. MAY REPEAT IN TWO HOURS IF NEEDED 05/19/22   Ocie Doyne, MD  traZODone (DESYREL) 50 MG tablet Take 50 mg by mouth at bedtime. 05/18/13   [provider]      Allergies    Patient has no known allergies.    Review of Systems   Review of Systems  All other systems reviewed and are negative.   Physical Exam Updated Vital Signs BP (!) 141/100 (BP Location: Right Arm)   Pulse 74   Temp 99 F (37.2 C) (Oral)   Resp 18   Ht 5\' 6"  (1.676 m)   Wt 108 kg   LMP 11/09/2022 (Exact Date)   SpO2 100%   BMI 38.43 kg/m  Physical Exam Vitals and nursing note reviewed.  Constitutional:      General: She is not in acute distress.    Appearance: She is well-developed. She is obese.  HENT:     Head: Normocephalic and atraumatic.     Mouth/Throat:   Eyes:     Conjunctiva/sclera: Conjunctivae normal.  Cardiovascular:     Rate and Rhythm: Normal rate and regular rhythm.  Pulmonary:     Effort: Pulmonary effort is normal. No respiratory distress.     Breath sounds: Normal breath sounds. No stridor.  Abdominal:     General: There is no distension.  Skin:    General: Skin is warm and dry.  Neurological:     Mental Status: She is alert and oriented to person, place, and time.     Cranial Nerves: No cranial nerve deficit.  Psychiatric:        Mood and Affect: Mood normal.     ED Results / Procedures / Treatments   Labs (all labs ordered are listed, but only abnormal results are displayed) Labs Reviewed  COMPREHENSIVE METABOLIC PANEL  ETHANOL  CBC WITH DIFFERENTIAL/PLATELET  LIPASE, BLOOD    EKG None  Radiology DG Chest 2 View  Result Date: 11/15/2022 CLINICAL DATA:   Chest pain Shortness of breath EXAM: CHEST - 2 VIEW COMPARISON:  09/29/2022 FINDINGS: Cardiomediastinal silhouette and pulmonary vasculature are within normal limits. Lungs are clear. IMPRESSION: No acute cardiopulmonary process. Electronically Signed   By: Acquanetta Belling M.D.   On: 11/15/2022 10:43    Procedures Procedures    Medications Ordered in ED Medications  predniSONE (DELTASONE) tablet 60 mg (has no administration in time range)    ED Course/ Medical Decision Making/ A&P                             Medical Decision Making Patient presents with weeks of intermittent shortness of breath, chest tightness, fatigue.  She notes a history of frequent marijuana or hip smoking, drinking only occasionally.  She has a history of infected molar, right-sided as well, was seen at one of our affiliated centers recently.  She notes that she has been taking her antibiotics.  Over the past 10 days or so she has had episodic shortness of breath, tightness, no fever, no syncope, no vomiting.  She notes that she has not seen her primary care team during this illness.  Amount and/or Complexity of Data Reviewed External Data Reviewed: notes. Labs: ordered. Decision-making details documented in ED Course. Radiology: ordered and independent interpretation performed. Decision-making details documented in ED Course. ECG/medicine tests: ordered and independent interpretation performed. Decision-making details documented in ED Course.  Risk Prescription drug management. Decision regarding hospitalization. Diagnosis or treatment significantly limited by social determinants of health.  O2- 100%ra, nml  Card: 75 sr, nml 11:38 AM On repeat exam patient is awake, alert, in no distress, sitting upright.  She is requesting something to drink.  Reviewed all findings, discussed with her, no evidence for acute new phenomenon.  Some suspicion for smoking associated dyspnea, possible mild bronchitis, no hemodynamic  instability, notes of bacteremia, sepsis, patient will follow-up with primary care.        Final Clinical Impression(s) / ED Diagnoses Final diagnoses:  SOB (shortness of breath)    Rx / DC Orders ED Discharge Orders          Ordered    predniSONE (DELTASONE) 20 MG tablet  Daily with breakfast        11/15/22 1137              Gerhard Munch, MD 11/15/22 1139

## 2022-11-15 NOTE — ED Triage Notes (Signed)
Pt arrived reporting Chest pain, right side arm numbness and Shob. Reports these symptoms started a few months ago, progressively getting worse. Nad noted in triage. Denies recent illness. States she has a toothache and doesn't know if its causing symptoms reported above. Denies any other complaints.

## 2022-11-15 NOTE — Discharge Instructions (Addendum)
As discussed, your evaluation today has been largely reassuring.  But, it is important that you monitor your condition carefully, and do not hesitate to return to the ED if you develop new, or concerning changes in your condition.  Please follow-up with our community [facility] wellness center or your primary care physician for appropriate ongoing care.  You have also been provided a list of dental resources for dental follow-up.

## 2022-12-01 ENCOUNTER — Ambulatory Visit (INDEPENDENT_AMBULATORY_CARE_PROVIDER_SITE_OTHER): Payer: MEDICAID

## 2022-12-01 ENCOUNTER — Ambulatory Visit (INDEPENDENT_AMBULATORY_CARE_PROVIDER_SITE_OTHER): Payer: MEDICAID | Admitting: Family Medicine

## 2022-12-01 VITALS — BP 133/93 | HR 64 | Ht 66.0 in | Wt 221.7 lb

## 2022-12-01 DIAGNOSIS — R202 Paresthesia of skin: Secondary | ICD-10-CM

## 2022-12-01 DIAGNOSIS — M79609 Pain in unspecified limb: Secondary | ICD-10-CM | POA: Diagnosis not present

## 2022-12-01 DIAGNOSIS — F419 Anxiety disorder, unspecified: Secondary | ICD-10-CM

## 2022-12-01 DIAGNOSIS — R053 Chronic cough: Secondary | ICD-10-CM

## 2022-12-01 DIAGNOSIS — F4310 Post-traumatic stress disorder, unspecified: Secondary | ICD-10-CM

## 2022-12-01 DIAGNOSIS — F900 Attention-deficit hyperactivity disorder, predominantly inattentive type: Secondary | ICD-10-CM

## 2022-12-01 DIAGNOSIS — F319 Bipolar disorder, unspecified: Secondary | ICD-10-CM | POA: Diagnosis not present

## 2022-12-01 DIAGNOSIS — Z124 Encounter for screening for malignant neoplasm of cervix: Secondary | ICD-10-CM

## 2022-12-01 DIAGNOSIS — Z7689 Persons encountering health services in other specified circumstances: Secondary | ICD-10-CM

## 2022-12-01 DIAGNOSIS — F3289 Other specified depressive episodes: Secondary | ICD-10-CM

## 2022-12-01 DIAGNOSIS — G43009 Migraine without aura, not intractable, without status migrainosus: Secondary | ICD-10-CM

## 2022-12-01 MED ORDER — GUAIFENESIN ER 600 MG PO TB12
1200.0000 mg | ORAL_TABLET | Freq: Two times a day (BID) | ORAL | 5 refills | Status: DC
Start: 2022-12-01 — End: 2022-12-20

## 2022-12-01 NOTE — Progress Notes (Signed)
New Patient Office Visit  Subjective    Patient ID: Miranda Robertson, female    DOB: 1990/10/26  Age: 32 y.o. MRN: [id number]  CC:  Chief Complaint  Patient presents with   New Patient (Initial Visit)    Pt said Miranda Robertson has been having problems having numbness and tingling in her right arm which has been going on for about a year now. Also states her chest has had a cold sensation to it. Pt has been to the ED multiple times.   HPI Miranda Robertson is a 32 year-old female patient who presents to establish care. Miranda Robertson has a past medical history of seasonal allergies/asthma, migraines, anxiety, PTSD & bipolar disorder- established with psychiatry. Miranda Robertson has concerns today regarding R arm with constant numbness and tingling- Miranda Robertson reports it happens multiple times per day and sometimes lingers. Starts posterior right shoulder and travels down to right hand. Last year- Miranda Robertson broke up with the father of her child and took multiple MDMA pills. Miranda Robertson reports that Miranda Robertson took 4 pills over a few hours and this is when Miranda Robertson noticed the tingling sensation started then and has not stopped since. Patient denies weakness, family history of demyelinating disorder, headache, blurred vision, eye pain, chest pain, shortness of breath, abdominal pain, skin changes, neck pain. Last seen for paresthesia on 08/26/2022-- CT cervical spine wo contrast showed mild reversal of normal cervical lordosis, no acute bony findings, and no large disc protrusions, spinal or foraminal stenosis.   Former PCP- [facility] Dr. Salli Real   Reports chest "coldness/tingling sensation." Denies chest pain, pressure, shortness of breath, jaw pain, neck pain.   Reports headaches with N/V. Rizatriptan & propranolol PRN- "sometimes help headaches."   Chronic cough with sputum production. Denies hemoptysis, shortness of breath, chest pain, palpitations, lower extremity edema, weight loss, fever/chills, fatigue. Had recent CXR 7/29 with no active  cardiopulmonary disease.   Outpatient Encounter Medications as of 12/01/2022  Medication Sig   CETIRIZINE HCL PO Take by mouth.   diphenhydrAMINE HCl (BENADRYL PO) Take by mouth.   FLUoxetine (PROZAC) 20 MG capsule Take 1 capsule (20 mg total) by mouth daily.   guaiFENesin (MUCINEX) 600 MG 12 hr tablet Take 2 tablets (1,200 mg total) by mouth 2 (two) times daily.   lurasidone (LATUDA) 80 MG TABS tablet Take 1 tablet (80 mg total) by mouth daily with breakfast.   magic mouthwash (nystatin, lidocaine, diphenhydrAMINE) suspension Take 5 mLs by mouth 4 (four) times daily. Swish in mouth for 30 seconds then spit out.   Montelukast Sodium (SINGULAIR PO) Take by mouth.   propranolol (INDERAL) 20 MG tablet TAKE ONE TABLET BY MOUTH TWICE A DAY   rizatriptan (MAXALT) 5 MG tablet TAKE ONE TABLET BY MOUTH AS NEEDED FOR MIGRAINE. MAY REPEAT IN TWO HOURS IF NEEDED   [DISCONTINUED] amoxicillin-clavulanate (AUGMENTIN) 875-125 MG tablet Take 1 tablet by mouth every 12 (twelve) hours.   [DISCONTINUED] ARIPiprazole (ABILIFY) 5 MG tablet Take 5 mg by mouth as needed. Take 5 mg by mouth Once Daily. Indications: additional treatment for major depressive disorder   [DISCONTINUED] citalopram (CELEXA) 20 MG tablet Take 20 mg by mouth daily.   [DISCONTINUED] metroNIDAZOLE (FLAGYL) 500 MG tablet Take 1 tablet (500 mg total) by mouth 2 (two) times daily.   [DISCONTINUED] predniSONE (DELTASONE) 20 MG tablet Take 2 tablets (40 mg total) by mouth daily with breakfast. For the next four days   [DISCONTINUED] traZODone (DESYREL) 50 MG tablet Take 50 mg by mouth at  bedtime.   No facility-administered encounter medications on file as of 12/01/2022.    Past Medical History:  Diagnosis Date   Allergy    Anemia    Anxiety    Asthma    Childhood   Blood transfusion without reported diagnosis    Depression    HSV (herpes simplex virus) infection    Mental disorder    bipolar, depression, mild menatl retardation, suicidal  thoughts in 2010   Substance abuse [facility])     Past Surgical History:  Procedure Laterality Date   CESAREAN SECTION  06/09/2012   FTP   CESAREAN SECTION N/A 05/21/2014   Procedure: CESAREAN SECTION;  Surgeon: Reva Bores, MD;  Location: [facility];  Service: Obstetrics;  Laterality: N/A;   CHOLECYSTECTOMY     WISDOM TOOTH EXTRACTION      Family History  Problem Relation Age of Onset   Headache Father    Alcohol abuse Father    Drug abuse Father    Headache Sister    ADD / ADHD Sister    Anxiety disorder Sister    Depression Sister    Obesity Paternal Grandmother    COPD Paternal Aunt     Review of Systems  Constitutional:  Negative for chills, fever and malaise/fatigue.  HENT:  Negative for congestion, sinus pain and sore throat.   Eyes:  Negative for blurred vision, double vision, discharge and redness.  Respiratory:  Positive for cough and sputum production. Negative for hemoptysis, shortness of breath and wheezing.   Cardiovascular:  Negative for chest pain, palpitations and leg swelling.  Gastrointestinal:  Negative for abdominal pain, heartburn, nausea and vomiting.  Musculoskeletal:  Negative for joint pain, myalgias and neck pain.  Skin:  Negative for itching and rash.  Neurological:  Positive for tingling (& numbness in R arm) and headaches (occ. migraines). Negative for dizziness and weakness.  Psychiatric/Behavioral:  Negative for depression and suicidal ideas. The patient is not nervous/anxious and does not have insomnia.      Objective    BP (!) 133/93   Pulse 64   Ht 5\' 6"  (1.676 m)   Wt 221 lb 11.2 oz (100.6 kg)   LMP 11/09/2022 (Exact Date)   SpO2 100%   BMI 35.78 kg/m   Physical Exam Constitutional:      Appearance: Normal appearance.  Cardiovascular:     Rate and Rhythm: Normal rate and regular rhythm.     Pulses: Normal pulses.     Heart sounds: Normal heart sounds.  Pulmonary:     Effort: Pulmonary effort is normal. No tachypnea, prolonged  expiration or respiratory distress.     Breath sounds: Examination of the right-upper field reveals decreased breath sounds. Examination of the left-upper field reveals decreased breath sounds. Decreased breath sounds present. No wheezing, rhonchi or rales.  Musculoskeletal:     Cervical back: Full passive range of motion without pain and normal range of motion.  Lymphadenopathy:     Cervical: No cervical adenopathy.  Neurological:     Mental Status: Miranda Robertson is alert.  Psychiatric:        Mood and Affect: Mood normal.        Behavior: Behavior normal.    Assessment & Plan:   1. Encounter to establish care Patient is a pleasant 32 year-old female patient who presents today to establish care. Reviewed the past medical history, family history, social history, surgical history, medications, and allergies today- updates made as indicated. Patient has concerns today about a  chronic cough with sputum production and numbness/tinging in her right arm.   2. Chronic cough Patient is a never cigarette smoker. Miranda Robertson does report smoking marijuana daily. Miranda Robertson reports Miranda Robertson has had a chronic cough for the past year, with it becoming progressively worse over the past few months. Recent CXR performed 11/15/2022- no acute findings or acute cardiopulmonary process. Patient reports Miranda Robertson still has a cough with brownish sputum production. Lungs with slight decreased upper breath sounds in left and right lung fields. No wheezing, rhonchi or rales present. Patient denies history of COPD and has childhood asthma. Miranda Robertson reports Miranda Robertson has been trying Mucinex  Will obtain CXR today and update patient with results.  - DG Chest 2 View - guaiFENesin (MUCINEX) 600 MG 12 hr tablet; Take 2 tablets (1,200 mg total) by mouth 2 (two) times daily.  Dispense: 60 tablet; Refill: 5  3. Paresthesia and pain of right extremity Patient reports Miranda Robertson has been dealing with paresthesia/pain of the right extremity. Miranda Robertson was seen in the ED regarding these  same symptoms ion 08/26/2022. Denies recent trauma/injury, chest pain, shortness of breath. No red flags present on exam. Miranda Robertson reports a history of vitamin B12 deficiency. Will check various labs that could be causing paresthesia and update patient with results.  - TSH Rfx on Abnormal to Free T4 - HgB A1c - Vitamin B12 - Magnesium  4. Bipolar I disorder (HCC) Patient taking lurasidone 80mg  daily- managed by psychiatrist.    5. Depressive disorder, atypical Patient taking lurasidone 80mg  daily and fluoxetine 20mg  daily for depression- managed by psychiatrist.    6. Anxiety Patient taking fluoxetine 20mg  daily- managed by psychiatrist.    7. Attention deficit hyperactivity disorder (ADHD), predominantly inattentive type Miranda Robertson was not restarted Vyvanse due to marijuana use. Patient's most recent visit on 07/20/2022 with psychiatry.   8. PTSD (post-traumatic stress disorder) Followed by psychiatry.   9. Migraine without aura and without status migrainosus, not intractable Patient is currently prescribed propranolol 20mg  twice daily & rizatriptan 5mg  PRN for management of migraines. Miranda Robertson reports Miranda Robertson has not needed her migraine medication recently.   10. Screening for cervical cancer Patient would like to be referred to [facility]. Referral sent.  - Ambulatory referral to Obstetrics / Gynecology    Return in about 2 months (around 01/31/2023), or chronic cough.   Alyson Reedy, FNP

## 2022-12-04 LAB — VITAMIN B12: Vitamin B-12: 596 pg/mL (ref 232–1245)

## 2022-12-04 LAB — TSH RFX ON ABNORMAL TO FREE T4: TSH: 0.979 u[IU]/mL (ref 0.450–4.500)

## 2022-12-04 LAB — HEMOGLOBIN A1C
Est. average glucose Bld gHb Est-mCnc: 94 mg/dL
Hgb A1c MFr Bld: 4.9 % (ref 4.8–5.6)

## 2022-12-04 LAB — MAGNESIUM: Magnesium: 2.2 mg/dL (ref 1.6–2.3)

## 2022-12-06 ENCOUNTER — Other Ambulatory Visit (HOSPITAL_BASED_OUTPATIENT_CLINIC_OR_DEPARTMENT_OTHER): Payer: Self-pay | Admitting: Family Medicine

## 2022-12-06 DIAGNOSIS — M79609 Pain in unspecified limb: Secondary | ICD-10-CM

## 2022-12-20 ENCOUNTER — Encounter (HOSPITAL_COMMUNITY): Payer: Self-pay

## 2022-12-20 ENCOUNTER — Ambulatory Visit (HOSPITAL_COMMUNITY)
Admission: RE | Admit: 2022-12-20 | Discharge: 2022-12-20 | Disposition: A | Discharge status: Home / Self Care | Payer: MEDICAID | Source: Ambulatory Visit | Attending: Family Medicine | Admitting: Family Medicine

## 2022-12-20 VITALS — BP 113/72 | HR 73 | Temp 98.2°F | Resp 18

## 2022-12-20 DIAGNOSIS — K047 Periapical abscess without sinus: Secondary | ICD-10-CM | POA: Diagnosis not present

## 2022-12-20 MED ORDER — KETOROLAC TROMETHAMINE 10 MG PO TABS: 10.0000 mg | ORAL_TABLET | Freq: Four times a day (QID) | ORAL | 0 refills | Status: DC | PRN

## 2022-12-20 MED ORDER — AMOXICILLIN-POT CLAVULANATE 875-125 MG PO TABS: 1.0000 | ORAL_TABLET | Freq: Two times a day (BID) | ORAL | 0 refills | Status: AC

## 2022-12-20 MED ORDER — KETOROLAC TROMETHAMINE 30 MG/ML IJ SOLN
INTRAMUSCULAR | Status: AC
  Filled 2022-12-20: qty 1

## 2022-12-20 MED ORDER — KETOROLAC TROMETHAMINE 30 MG/ML IJ SOLN
30.0000 mg | Freq: Once | INTRAMUSCULAR | Status: AC
  Administered 2022-12-20: 30 mg via INTRAMUSCULAR

## 2022-12-20 NOTE — Discharge Instructions (Signed)
You have been given a shot of Toradol 30 mg today.  Ketorolac 10 mg tablets--take 1 tablet every 6 hours as needed for pain.  This is the same medicine that is in the shot we just gave you  Take amoxicillin-clavulanate 875 mg--1 tab twice daily with food for 7 days  Please followup with your dentist

## 2022-12-20 NOTE — ED Provider Notes (Signed)
[facility]    CSN: [id number] Arrival date & time: 12/20/22  1259      History   Chief Complaint Chief Complaint  Patient presents with   Dental Pain    HPI Miranda Robertson is a 32 y.o. female.    Dental Pain Here for pain in right lower teeth in the last 2-3 days. No f/c or URI symptoms   NKDA  LMP 8/11    Past Medical History:  Diagnosis Date   Allergy    Anemia    Anxiety    Asthma    Childhood   Blood transfusion without reported diagnosis    Depression    HSV (herpes simplex virus) infection    Mental disorder    bipolar, depression, mild menatl retardation, suicidal thoughts in 2010   Substance abuse [facility])     Patient Active Problem List   Diagnosis Date Noted   PTSD (post-traumatic stress disorder) 12/01/2022   Bipolar 1 disorder, mixed, partial remission (HCC) 04/28/2021   Bipolar I disorder (HCC) 11/27/2019   Attention deficit hyperactivity disorder (ADHD), 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   Previous cesarean section 05/22/2014   Gestational hypertension without significant proteinuria, antepartum 05/16/2014   Gestational hypertension    H/O cesarean section complicating pregnancy 05/06/2014   Pregnant 05/06/2014   Drug use affecting pregnancy in third trimester 04/30/2014   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   Lost custody of children 04/30/2014   Previous cesarean delivery, antepartum 04/30/2014   Depressive disorder, 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    Past Surgical History:  Procedure Laterality Date   CESAREAN SECTION  06/09/2012   FTP   CESAREAN SECTION N/A 05/21/2014   Procedure: CESAREAN SECTION;  Surgeon: Reva Bores, MD;  Location: [facility];  Service: Obstetrics;  Laterality: N/A;   CHOLECYSTECTOMY     WISDOM TOOTH EXTRACTION      OB History     Gravida  2   Para  2   Term  2   Preterm      AB      Living  2      SAB      IAB      Ectopic      Multiple  0   Live Births  2             Home Medications    Prior to Admission medications   Medication Sig Start Date End Date Taking? Authorizing Provider  amoxicillin-clavulanate (AUGMENTIN) 875-125 MG tablet Take 1 tablet by mouth 2 (two) times daily for 7 days. 12/20/22 12/27/22 Yes Zenia Resides, MD  diphenhydrAMINE HCl (BENADRYL PO) Take by mouth.   Yes [provider]  FLUoxetine (PROZAC) 20 MG capsule Take 1 capsule (20 mg total) by mouth daily. 07/20/22  Yes Toy Cookey E, NP  ketorolac (TORADOL) 10 MG tablet Take 1 tablet (10 mg total) by mouth every 6 (six) hours as needed (pain). 12/20/22  Yes Zenia Resides, MD  lurasidone (LATUDA) 80 MG TABS tablet Take 1 tablet (80 mg total) by mouth daily with breakfast. 07/20/22  Yes Toy Cookey E, NP  Montelukast Sodium (SINGULAIR PO) Take by mouth.   Yes [provider]  propranolol (INDERAL) 20 MG tablet TAKE ONE TABLET BY MOUTH TWICE A DAY 05/19/22  Yes Ocie Doyne, MD  rizatriptan (MAXALT) 5 MG tablet TAKE ONE TABLET BY MOUTH AS NEEDED FOR MIGRAINE. MAY REPEAT IN TWO HOURS IF NEEDED 05/19/22  Yes Ocie Doyne, MD  Family History Family History  Problem Relation Age of Onset   Headache Father    Alcohol abuse Father    Drug abuse Father    Headache Sister    ADD / ADHD Sister    Anxiety disorder Sister    Depression Sister    Obesity Paternal Grandmother    COPD Paternal Aunt     Social History Social History   Tobacco Use   Smoking status: Never   Smokeless tobacco: Never  Vaping Use   Vaping status: Never Used  Substance Use Topics   Alcohol use: Yes    Comment: occasionally   Drug use: Yes    Types: Marijuana     Allergies   Patient has no known allergies.   Review of Systems Review of Systems   Physical Exam Triage Vital Signs ED Triage Vitals  Encounter Vitals Group     BP 12/20/22 1318 113/72     Systolic BP Percentile --      Diastolic BP Percentile --      Pulse Rate 12/20/22 1318 73      Resp 12/20/22 1318 18     Temp 12/20/22 1318 98.2 F (36.8 C)     Temp Source 12/20/22 1318 Oral     SpO2 12/20/22 1318 98 %     Weight --      Height --      Head Circumference --      Peak Flow --      Pain Score 12/20/22 1316 8     Pain Loc --      Pain Education --      Exclude from Growth Chart --    No data found.  Updated Vital Signs BP 113/72 (BP Location: Right Arm)   Pulse 73   Temp 98.2 F (36.8 C) (Oral)   Resp 18   LMP 11/28/2022 (Exact Date)   SpO2 98%   Visual Acuity Right Eye Distance:   Left Eye Distance:   Bilateral Distance:    Right Eye Near:   Left Eye Near:    Bilateral Near:     Physical Exam Vitals reviewed.  Constitutional:      General: She is not in acute distress.    Appearance: She is not ill-appearing, toxic-appearing or diaphoretic.  HENT:     Mouth/Throat:     Mouth: Mucous membranes are moist.     Comments: There is some dental caries on lower posterior right teeth.  No swelling or dc Eyes:     Extraocular Movements: Extraocular movements intact.     Pupils: Pupils are equal, round, and reactive to light.  Cardiovascular:     Rate and Rhythm: Normal rate and regular rhythm.     Heart sounds: No murmur heard. Pulmonary:     Effort: Pulmonary effort is normal.     Breath sounds: Normal breath sounds.  Musculoskeletal:     Cervical back: Neck supple.  Lymphadenopathy:     Cervical: No cervical adenopathy.  Skin:    Coloration: Skin is not pale.  Neurological:     General: No focal deficit present.     Mental Status: She is alert and oriented to person, place, and time.  Psychiatric:        Behavior: Behavior normal.      UC Treatments / Results  Labs (all labs ordered are listed, but only abnormal results are displayed) Labs Reviewed - No data to display  EKG   Radiology No results  found.  Procedures Procedures (including critical care time)  Medications Ordered in UC Medications  ketorolac (TORADOL) 30  MG/ML injection 30 mg (has no administration in time range)    Initial Impression / Assessment and Plan / UC Course  I have reviewed the triage vital signs and the nursing notes.  Pertinent labs & imaging results that were available during my care of the patient were reviewed by me and considered in my medical decision making (see chart for details).        Augmentin is sent in for the oral infection. Toradol injection is given here, and toradol tablets are sent to the pharmacy  She states she is established with a dentist and will call them tomorrow Final Clinical Impressions(s) / UC Diagnoses   Final diagnoses:  Dental infection     Discharge Instructions      You have been given a shot of Toradol 30 mg today.  Ketorolac 10 mg tablets--take 1 tablet every 6 hours as needed for pain.  This is the same medicine that is in the shot we just gave you  Take amoxicillin-clavulanate 875 mg--1 tab twice daily with food for 7 days  Please followup with your dentist     ED Prescriptions     Medication Sig Dispense Auth. Provider   ketorolac (TORADOL) 10 MG tablet Take 1 tablet (10 mg total) by mouth every 6 (six) hours as needed (pain). 20 tablet Zenia Resides, MD   amoxicillin-clavulanate (AUGMENTIN) 875-125 MG tablet Take 1 tablet by mouth 2 (two) times daily for 7 days. 14 tablet Dymphna Wadley, Janace Aris, MD      PDMP not reviewed this encounter.   Zenia Resides, MD 12/20/22 1344

## 2022-12-20 NOTE — ED Triage Notes (Signed)
Pt states she is here for dental pain on bottom right X 5 days, She has been taking flexeril, mouth wash.

## 2023-01-05 ENCOUNTER — Ambulatory Visit (INDEPENDENT_AMBULATORY_CARE_PROVIDER_SITE_OTHER): Payer: MEDICAID | Admitting: Psychiatry

## 2023-01-05 ENCOUNTER — Encounter: Payer: Self-pay | Admitting: Psychiatry

## 2023-01-05 ENCOUNTER — Telehealth: Payer: Self-pay | Admitting: Psychiatry

## 2023-01-05 VITALS — BP 124/87 | HR 75 | Ht 66.0 in | Wt 219.4 lb

## 2023-01-05 DIAGNOSIS — R200 Anesthesia of skin: Secondary | ICD-10-CM | POA: Diagnosis not present

## 2023-01-05 DIAGNOSIS — G43009 Migraine without aura, not intractable, without status migrainosus: Secondary | ICD-10-CM | POA: Diagnosis not present

## 2023-01-05 MED ORDER — EMGALITY 120 MG/ML ~~LOC~~ SOAJ: 1.0000 | SUBCUTANEOUS | 6 refills | Status: DC

## 2023-01-05 MED ORDER — EMGALITY 120 MG/ML ~~LOC~~ SOAJ: 2.0000 | Freq: Once | SUBCUTANEOUS | 0 refills | Status: AC

## 2023-01-05 MED ORDER — RIZATRIPTAN BENZOATE 5 MG PO TABS: 5.0000 mg | ORAL_TABLET | ORAL | 6 refills | Status: DC | PRN

## 2023-01-05 NOTE — Telephone Encounter (Signed)
sent to GI [phone] [facility], with [facility] guidance is waiving prior authorization for the dates of service from October 18, 2022, through January 17, 2023. Prior authorization will be required for all dates of service on January 18, 2023, and thereafter. [employer] will be available to begin providing prior authorizations for those services starting on January 10, 2023, for dates of service January 18, 2023, and after.

## 2023-01-05 NOTE — Progress Notes (Signed)
[facility]  PATIENT: Miranda Robertson DOB: [birth year]  REFERRING CLINICIAN: Alyson Reedy, FNP HISTORY FROM: self REASON FOR VISIT: paresthesia of RUE   HISTORICAL  CHIEF COMPLAINT:  Chief Complaint  Patient presents with   Consult    Pt in room 11. Here for consult Paresthesia and pain of right extremity. Pt said right side shoulder/arm goes numb, tingling sensation now for 5-6 months . Pt report she does have a bad tooth on right side that need to be extracted.  Pt needs refill Toradol for tooth pain asked if you would refill was prescribed Urgent care.    HISTORY OF PRESENT ILLNESS:  The patient presents for evaluation of paresthesias in the right upper extremity which began ~6 months ago. Around that time she started feeling paresthesias in her neck and shoulder which radiated down her entire arm.  Sometimes feels electric jolts down her neck and arm as well. Her arm will go numb if she lifts it over her head. Has trouble opening jars with her right hand. Can last all day or a few hours at a time. CT C-spine 08/26/22 showed mild reversal of cervical lordosis and was otherwise unremarkable.  She reports a prior episode of tingling in her right hand around June of last year, which resolved on its own after a couple of months.  She has also previously been seen in clinic for migraines. Was started on propranolol and Maxalt. She is unsure if paresthesias are related to the migraines but notes that she continues to have 4-7 migraines per week. Maxalt does help for rescue. MRI brain 11/19/21 showed nonspecific white matter changes and was otherwise unremarkable.  Prior Headache Therapies                                 Lamictal 100 mg BID Fluoxetine 20 mg daily Propranolol Excedrin  OTHER MEDICAL CONDITIONS: bipolar 1 disorder, ADHD, migraines, mild asthma (rarely uses inhaler)    REVIEW OF SYSTEMS: Full 14 system review of systems performed and negative with  exception of: paresthesias, headaches  ALLERGIES: No Known Allergies  HOME MEDICATIONS: Outpatient Medications Prior to Visit  Medication Sig Dispense Refill   diphenhydrAMINE HCl (BENADRYL PO) Take by mouth.     FLUoxetine (PROZAC) 20 MG capsule Take 1 capsule (20 mg total) by mouth daily. 30 capsule 3   ketorolac (TORADOL) 10 MG tablet Take 1 tablet (10 mg total) by mouth every 6 (six) hours as needed (pain). 20 tablet 0   lurasidone (LATUDA) 80 MG TABS tablet Take 1 tablet (80 mg total) by mouth daily with breakfast. 30 tablet 3   Montelukast Sodium (SINGULAIR PO) Take by mouth.     propranolol (INDERAL) 20 MG tablet TAKE ONE TABLET BY MOUTH TWICE A DAY 60 tablet 0   rizatriptan (MAXALT) 5 MG tablet TAKE ONE TABLET BY MOUTH AS NEEDED FOR MIGRAINE. MAY REPEAT IN TWO HOURS IF NEEDED 10 tablet 0   No facility-administered medications prior to visit.    PAST MEDICAL HISTORY: Past Medical History:  Diagnosis Date   Allergy    Anemia    Anxiety    Asthma    Childhood   Blood transfusion without reported diagnosis    Depression    HSV (herpes simplex virus) infection    Mental disorder    bipolar, depression, mild menatl retardation, suicidal thoughts in 2010   Substance abuse (HCC)  PAST SURGICAL HISTORY: Past Surgical History:  Procedure Laterality Date   CESAREAN SECTION  06/09/2012   FTP   CESAREAN SECTION N/A 05/21/2014   Procedure: CESAREAN SECTION;  Surgeon: Reva Bores, MD;  Location: [facility];  Service: Obstetrics;  Laterality: N/A;   CHOLECYSTECTOMY     WISDOM TOOTH EXTRACTION      FAMILY HISTORY: Family History  Problem Relation Age of Onset   Headache Father    Alcohol abuse Father    Drug abuse Father    Headache Sister    ADD / ADHD Sister    Anxiety disorder Sister    Depression Sister    Obesity Paternal Grandmother    COPD Paternal Aunt     SOCIAL HISTORY: Social History   Socioeconomic History   Marital status: Single    Spouse name:  Not on file   Number of children: Not on file   Years of education: Not on file   Highest education level: 12th grade  Occupational History   Not on file  Tobacco Use   Smoking status: Never   Smokeless tobacco: Never  Vaping Use   Vaping status: Never Used  Substance and Sexual Activity   Alcohol use: Yes    Comment: occasionally   Drug use: Yes    Types: Marijuana   Sexual activity: Yes    Birth control/protection: Condom    Comment: occasional condom use  Other Topics Concern   Not on file  Social History Narrative   Right handed   Caffeine intake 8 cups a week   Social Determinants of Health   Financial Resource Strain: Medium Risk (12/01/2022)   Overall Financial Resource Strain (CARDIA)    Difficulty of Paying Living Expenses: Somewhat hard  Food Insecurity: Food Insecurity Present (12/01/2022)   Hunger Vital Sign    Worried About Running Out of Food in the Last Year: Sometimes true    Ran Out of Food in the Last Year: Sometimes true  Transportation Needs: No Transportation Needs (12/01/2022)   PRAPARE - Administrator, Civil Service (Medical): No    Lack of Transportation (Non-Medical): No  Physical Activity: Sufficiently Active (12/01/2022)   Exercise Vital Sign    Days of Exercise per Week: 6 days    Minutes of Exercise per Session: 60 min  Stress: Stress Concern Present (12/01/2022)   [employer] of Occupational Health - Occupational Stress Questionnaire    Feeling of Stress : To some extent  Social Connections: Moderately Integrated (12/01/2022)   Social Connection and Isolation Panel [NHANES]    Frequency of Communication with Friends and Family: More than three times a week    Frequency of Social Gatherings with Friends and Family: Once a week    Attends Religious Services: 1 to 4 times per year    Active Member of [employer] or Organizations: Yes    Attends Engineer, structural: More than 4 times per year    Marital Status: Never  married  Intimate Partner Violence: Not on file     PHYSICAL EXAM  GENERAL EXAM/CONSTITUTIONAL: Vitals:  Vitals:   01/05/23 0851  BP: 124/87  Pulse: 75  Weight: 219 lb 6.4 oz (99.5 kg)  Height: 5\' 6"  (1.676 m)   Body mass index is 35.41 kg/m. Wt Readings from Last 3 Encounters:  01/05/23 219 lb 6.4 oz (99.5 kg)  12/01/22 221 lb 11.2 oz (100.6 kg)  11/15/22 238 lb 1.6 oz (108 kg)     NEUROLOGIC:  MENTAL STATUS:  awake, alert, oriented to person, place and time recent and remote memory intact normal attention and concentration language fluent, comprehension intact, naming intact fund of knowledge appropriate  CRANIAL NERVE:  2nd, 3rd, 4th, 6th - pupils equal and reactive to light, visual fields full to confrontation, extraocular muscles intact, no nystagmus 5th - facial sensation symmetric 7th - facial strength symmetric 8th - hearing intact 9th - palate elevates symmetrically, uvula midline 11th - shoulder shrug symmetric 12th - tongue protrusion midline  MOTOR:  normal bulk and tone, full strength in the BUE, BLE  SENSORY:  Decreased sensation to light touch RUE, otherwise intact to light touch and pin prick throughout  COORDINATION:  finger-nose-finger intact bilaterally  REFLEXES:  deep tendon reflexes present and symmetric  GAIT/STATION:  normal     DIAGNOSTIC DATA (LABS, IMAGING, TESTING) - I reviewed patient records, labs, notes, testing and imaging myself where available.  Lab Results  Component Value Date   WBC 8.6 11/15/2022   HGB 12.1 11/15/2022   HCT 38.1 11/15/2022   MCV 87.8 11/15/2022   PLT 301 11/15/2022      Component Value Date/Time   NA 137 11/15/2022 1015   NA 137 12/09/2017 1146   K 3.8 11/15/2022 1015   CL 106 11/15/2022 1015   CO2 24 11/15/2022 1015   GLUCOSE 94 11/15/2022 1015   BUN 9 11/15/2022 1015   BUN 8 12/09/2017 1146   CREATININE 0.62 11/15/2022 1015   CREATININE 0.52 05/16/2014 1650   CALCIUM 8.9  11/15/2022 1015   PROT 7.1 11/15/2022 1015   PROT 7.3 12/09/2017 1146   ALBUMIN 3.7 11/15/2022 1015   ALBUMIN 4.3 12/09/2017 1146   AST 16 11/15/2022 1015   ALT 18 11/15/2022 1015   ALKPHOS 74 11/15/2022 1015   BILITOT 0.4 11/15/2022 1015   BILITOT 0.3 12/09/2017 1146   GFRNONAA >60 11/15/2022 1015   GFRAA 140 12/09/2017 1146   Lab Results  Component Value Date   CHOL 187 12/09/2017   HDL 53 12/09/2017   LDLCALC 94 12/09/2017   TRIG 201 (H) 12/09/2017   CHOLHDL 3.5 12/09/2017   Lab Results  Component Value Date   HGBA1C 4.9 12/01/2022   Lab Results  Component Value Date   VITAMINB12 596 12/01/2022   Lab Results  Component Value Date   TSH 0.979 12/01/2022      ASSESSMENT AND PLAN  32 y.o. female with a history of bipolar 1 disorder, ADHD, migraines, mild asthma who presents for evaluation of right arm numbness/paresthesias, new since her last MRI in August 2023. MRI at that time showed nonspecific white matter changes suspected to be secondary to migraines. However with new symptoms will repeat brain MRI and check MRI C-spine with contrast to assess for structural causes including demyelination. Discussed EMG as a potential next step if imaging is normal. She continues to have headaches every 1-2 days on propranolol. She would prefer to try a monthly injection over a daily pill for prevention. Will switch to [facility] for migraine prevention.   1. Arm numbness       PLAN: -MRI brain, C-spine -Start Emgality 120 mg monthly -Continue Maxalt 5 mg PRN  Orders Placed This Encounter  Procedures   MR CERVICAL SPINE W WO CONTRAST   MR BRAIN W WO CONTRAST    Meds ordered this encounter  Medications   Galcanezumab-gnlm (EMGALITY) 120 MG/ML SOAJ    Sig: Inject 2 Pens into the skin once for 1 dose.  Dispense:  2.24 mL    Refill:  0   Galcanezumab-gnlm (EMGALITY) 120 MG/ML SOAJ    Sig: Inject 1 Pen into the skin every 30 (thirty) days.    Dispense:  1.12 mL     Refill:  6   rizatriptan (MAXALT) 5 MG tablet    Sig: Take 1 tablet (5 mg total) by mouth as needed for migraine. May repeat in 2 hours if needed    Dispense:  10 tablet    Refill:  6    Return in about 6 months (around 07/05/2023).    Ocie Doyne, MD 01/05/23 10:01 AM  I spent an average of 31 minutes chart reviewing and counseling the patient, with at least 50% of the time face to face with the patient.   [facility] [address], [address] [place], [place] [address] [phone]

## 2023-01-17 ENCOUNTER — Ambulatory Visit: Payer: Self-pay

## 2023-01-25 ENCOUNTER — Telehealth: Payer: Self-pay

## 2023-01-25 ENCOUNTER — Other Ambulatory Visit (HOSPITAL_COMMUNITY): Payer: Self-pay

## 2023-01-25 NOTE — Telephone Encounter (Signed)
Pharmacy Patient Advocate Encounter   Received notification from CoverMyMeds that prior authorization for Emgality 120MG /ML auto-injectors (migraine) is required/requested.   Insurance verification completed.   The patient is insured through [facility] .   Per test claim: PA required; PA started via CoverMyMeds. KEY [id number] . Waiting for clinical questions to populate.

## 2023-01-28 NOTE — Telephone Encounter (Signed)
     I could not fins documentation in chart for urine pregnancy test or for medication over-use headache-please advise.

## 2023-01-31 ENCOUNTER — Telehealth: Payer: Self-pay

## 2023-01-31 NOTE — Telephone Encounter (Signed)
CMM questions expired. New encounter created.

## 2023-01-31 NOTE — Telephone Encounter (Signed)
*  [facility]  Pharmacy Patient Advocate Encounter   Received notification from CoverMyMeds that prior authorization for Emgality 120MG /ML auto-injectors (migraine)  is required/requested.   Insurance verification completed.   The patient is insured through [facility] .   Per test claim: PA required; PA started via CoverMyMeds. KEY [id number] . Waiting for clinical questions to populate.

## 2023-01-31 NOTE — Telephone Encounter (Signed)
Monica, I see a negative pregnancy test from 10/2022. Also, I see medication overuse discussed in original note. Will this suffice?

## 2023-02-02 NOTE — Telephone Encounter (Signed)
Clinical questions have been submitted-sent pregnancy test results from July, Chart notes from 2023 and 2024-awaiting determination.

## 2023-02-03 ENCOUNTER — Other Ambulatory Visit: Payer: Self-pay | Admitting: Psychiatry

## 2023-02-03 DIAGNOSIS — R200 Anesthesia of skin: Secondary | ICD-10-CM

## 2023-02-03 NOTE — Telephone Encounter (Signed)
Pharmacy Patient Advocate Encounter  Received notification from [facility] that Prior Authorization for Emgality 120MG /ML auto-injectors (migraine) has been APPROVED from 01/28/2023 to 05/05/2023   PA #/Case ID/Reference #: [id number]/A

## 2023-02-28 ENCOUNTER — Ambulatory Visit (HOSPITAL_COMMUNITY)
Admission: RE | Admit: 2023-02-28 | Discharge: 2023-02-28 | Disposition: A | Discharge status: Home / Self Care | Payer: MEDICAID | Source: Ambulatory Visit | Attending: Family Medicine | Admitting: Family Medicine

## 2023-02-28 ENCOUNTER — Encounter (HOSPITAL_COMMUNITY): Payer: Self-pay

## 2023-02-28 VITALS — BP 121/82 | HR 72 | Temp 98.2°F | Resp 16

## 2023-02-28 DIAGNOSIS — L309 Dermatitis, unspecified: Secondary | ICD-10-CM | POA: Diagnosis not present

## 2023-02-28 MED ORDER — PREDNISONE 20 MG PO TABS: 40.0000 mg | ORAL_TABLET | Freq: Every day | ORAL | 0 refills | Status: AC

## 2023-02-28 MED ORDER — CEPHALEXIN 250 MG PO CAPS: 250.0000 mg | ORAL_CAPSULE | Freq: Three times a day (TID) | ORAL | 0 refills | Status: AC

## 2023-02-28 NOTE — Discharge Instructions (Signed)
Take prednisone 20 mg--2 daily for 5 days  Take cephalexin 250 mg--1 capsule 3 times daily for 5 days  Please follow-up with your primary care about this issue

## 2023-02-28 NOTE — ED Triage Notes (Signed)
Pt presents to the office rash on right arms and right leg. Pt reports she feels a tingling sensation.

## 2023-02-28 NOTE — ED Provider Notes (Signed)
[facility]    CSN: [id number] Arrival date & time: 02/28/23  1326      History   Chief Complaint Chief Complaint  Patient presents with   Rash    Bumps on skin and feels like tingling sensation on arms and legs and itching but not itching where the bumps are. - Entered by patient    HPI Miranda Robertson is a 32 y.o. female.    Rash Here for some pink scattered bumps on her arms and proximal right thigh.  No fever or chills  They do itch a little bit but she has more of a feeling of a tingling or feeling like you have a chill.  No allergies to medications  Last menstrual cycle was October 26   Past Medical History:  Diagnosis Date   Allergy    Anemia    Anxiety    Asthma    Childhood   Blood transfusion without reported diagnosis    Depression    HSV (herpes simplex virus) infection    Mental disorder    bipolar, depression, mild menatl retardation, suicidal thoughts in 2010   Substance abuse [facility])     Patient Active Problem List   Diagnosis Date Noted   PTSD (post-traumatic stress disorder) 12/01/2022   Bipolar 1 disorder, mixed, partial remission (HCC) 04/28/2021   Bipolar I disorder (HCC) 11/27/2019   Attention deficit hyperactivity disorder (ADHD), 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   Previous cesarean section 05/22/2014   Gestational hypertension without significant proteinuria, antepartum 05/16/2014   Gestational hypertension    H/O cesarean section complicating pregnancy 05/06/2014   Pregnant 05/06/2014   Drug use affecting pregnancy in third trimester 04/30/2014   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   Lost custody of children 04/30/2014   Previous cesarean delivery, antepartum 04/30/2014   Depressive disorder, 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 Authorizing Provider  cephALEXin (KEFLEX) 250 MG capsule Take 1 capsule (250 mg total) by mouth 3 (three) times daily for 5 days. 02/28/23 03/05/23 Yes Zenia Resides, MD  predniSONE (DELTASONE) 20 MG tablet Take 2 tablets (40 mg total) by mouth daily with breakfast for 5 days. 02/28/23 03/05/23 Yes Zenia Resides, MD  FLUoxetine (PROZAC) 20 MG capsule Take 1 capsule (20 mg total) by mouth daily. 07/20/22   Shanna Cisco, NP  Galcanezumab-gnlm (EMGALITY) 120 MG/ML SOAJ Inject 1 Pen into the skin every 30 (thirty) days. 01/05/23   Ocie Doyne, MD  lurasidone (LATUDA) 80 MG TABS tablet Take 1 tablet (80 mg total) by mouth daily with breakfast. 07/20/22   Doyne Keel,  Brittney E, NP  Montelukast Sodium (SINGULAIR PO) Take by mouth.    [provider]  rizatriptan (MAXALT) 5 MG tablet Take 1 tablet (5 mg total) by mouth as needed for migraine. May repeat in 2 hours if needed 01/05/23   Ocie Doyne, MD    Family History Family History  Problem Relation Age of Onset   Headache Father    Alcohol abuse Father    Drug abuse Father    Headache Sister    ADD / ADHD Sister    Anxiety disorder Sister    Depression Sister    Obesity Paternal Grandmother    COPD Paternal Aunt     Social History Social History   Tobacco Use   Smoking status: Never   Smokeless tobacco: Never  Vaping Use   Vaping status: Never Used  Substance Use Topics   Alcohol use: Yes    Comment: occasionally   Drug use: Yes    Types: Marijuana     Allergies   Patient has no known allergies.   Review of  Systems Review of Systems  Skin:  Positive for rash.     Physical Exam Triage Vital Signs ED Triage Vitals [02/28/23 1349]  Encounter Vitals Group     BP 121/82     Systolic BP Percentile      Diastolic BP Percentile      Pulse Rate 72     Resp 16     Temp 98.2 F (36.8 C)     Temp Source Oral     SpO2 98 %     Weight      Height      Head Circumference      Peak Flow      Pain Score      Pain Loc      Pain Education      Exclude from Growth Chart    No data found.  Updated Vital Signs BP 121/82 (BP Location: Left Arm)   Pulse 72   Temp 98.2 F (36.8 C) (Oral)   Resp 16   LMP 02/12/2023   SpO2 98%   Visual Acuity Right Eye Distance:   Left Eye Distance:   Bilateral Distance:    Right Eye Near:   Left Eye Near:    Bilateral Near:     Physical Exam Vitals reviewed.  Constitutional:      General: She is not in acute distress.    Appearance: She is not toxic-appearing.  HENT:     Mouth/Throat:     Mouth: Mucous membranes are moist.     Comments: No swelling of the tongue or lips Eyes:     Extraocular Movements: Extraocular movements intact.     Pupils: Pupils are equal, round, and reactive to light.  Cardiovascular:     Rate and Rhythm: Normal rate and regular rhythm.     Heart sounds: No murmur heard. Pulmonary:     Effort: No respiratory distress.     Breath sounds: No stridor. No wheezing, rhonchi or rales.  Skin:    Coloration: Skin is not pale.     Comments: There are a few scattered mildly raised erythematous bumps that are about 1 or 2 mm in diameter.  No vesicular lesions.  These bumps are on her upper arms and on her right proximal thigh.    Neurological:     General: No focal deficit present.     Mental Status: She is alert and oriented to person,  place, and time.  Psychiatric:        Behavior: Behavior normal.      UC Treatments / Results  Labs (all labs ordered are listed, but only abnormal results are displayed) Labs Reviewed  - No data to display  EKG   Radiology No results found.  Procedures Procedures (including critical care time)  Medications Ordered in UC Medications - No data to display  Initial Impression / Assessment and Plan / UC Course  I have reviewed the triage vital signs and the nursing notes.  Pertinent labs & imaging results that were available during my care of the patient were reviewed by me and considered in my medical decision making (see chart for details).     Keflex is sent in for possible folliculitis, and 5-day burst of prednisone is sent in for treatment of possible contact dermatitis  She will follow-up with primary care Final Clinical Impressions(s) / UC Diagnoses   Final diagnoses:  Dermatitis     Discharge Instructions      Take prednisone 20 mg--2 daily for 5 days  Take cephalexin 250 mg--1 capsule 3 times daily for 5 days  Please follow-up with your primary care about this issue      ED Prescriptions     Medication Sig Dispense Auth. Provider   predniSONE (DELTASONE) 20 MG tablet Take 2 tablets (40 mg total) by mouth daily with breakfast for 5 days. 10 tablet Zenia Resides, MD   cephALEXin (KEFLEX) 250 MG capsule Take 1 capsule (250 mg total) by mouth 3 (three) times daily for 5 days. 15 capsule Marlinda Mike Janace Aris, MD      PDMP not reviewed this encounter.   Zenia Resides, MD 02/28/23 [phone]

## 2023-03-04 ENCOUNTER — Encounter (HOSPITAL_BASED_OUTPATIENT_CLINIC_OR_DEPARTMENT_OTHER): Payer: Self-pay | Admitting: Family Medicine

## 2023-03-04 DIAGNOSIS — R21 Rash and other nonspecific skin eruption: Secondary | ICD-10-CM

## 2023-03-04 MED ORDER — TRIAMCINOLONE ACETONIDE 0.5 % EX CREA
1.0000 | TOPICAL_CREAM | Freq: Two times a day (BID) | CUTANEOUS | 3 refills | Status: DC
Start: 2023-03-04 — End: 2023-12-18

## 2023-03-08 ENCOUNTER — Encounter (HOSPITAL_BASED_OUTPATIENT_CLINIC_OR_DEPARTMENT_OTHER): Payer: Self-pay | Admitting: Family Medicine

## 2023-03-08 ENCOUNTER — Ambulatory Visit (INDEPENDENT_AMBULATORY_CARE_PROVIDER_SITE_OTHER): Payer: MEDICAID | Admitting: Family Medicine

## 2023-03-08 VITALS — BP 121/73 | HR 83 | Ht 66.0 in | Wt 222.5 lb

## 2023-03-08 DIAGNOSIS — R21 Rash and other nonspecific skin eruption: Secondary | ICD-10-CM | POA: Diagnosis not present

## 2023-03-08 NOTE — Patient Instructions (Addendum)
*let me know if rash has not improved by Friday- may be beneficial to see dermatology    MyChart:  For all urgent or time sensitive needs we ask that you please call the office to avoid delays. Our number is [phone]. MyChart is not constantly monitored and due to the large volume of messages a day, replies may take up to 72 business hours.   MyChart Policy: MyChart allows for you to see your visit notes, after visit summary, provider recommendations, lab and tests results, make an appointment, request refills, and contact your provider or the office for non-urgent questions or concerns. Providers are seeing patients during normal business hours and do not have built in time to review MyChart messages.  We ask that you allow a minimum of 3 business days for responses to [employer]. For this reason, please do not send urgent requests through MyChart. Please call the office at [phone]. New and ongoing conditions may require a visit. We have virtual and in person visit available for your convenience.  Complex MyChart concerns may require a visit. Your provider may request you schedule a virtual or in person visit to ensure we are providing the best care possible. MyChart messages sent after 11:00 AM on Friday will not be received by the provider until Monday morning.    Lab and Test Results: You will receive your lab and test results on MyChart as soon as they are completed and results have been sent by the lab or testing facility. Due to this service, you will receive your results BEFORE your provider.  I review lab and tests results each morning prior to seeing patients. Some results require collaboration with other providers to ensure you are receiving the most appropriate care. For this reason, we ask that you please allow a minimum of 3-5 business days from the time the ALL results have been received for your provider to receive and review lab and test results and contact you about  these.  Most lab and test result comments from the provider will be sent through MyChart. Your provider may recommend changes to the plan of care, follow-up visits, repeat testing, ask questions, or request an office visit to discuss these results. You may reply directly to this message or call the office at [phone] to provide information for the provider or set up an appointment. In some instances, you will be called with test results and recommendations. Please let us know if this is preferred and we will make note of this in your chart to provide this for you.    If you have not heard a response to your lab or test results in 5 business days from all results returning to MyChart, please call the office to let us know. We ask that you please avoid calling prior to this time unless there is an emergent concern. Due to high call volumes, this can delay the resulting process.   After Hours: For all non-emergency after hours needs, please call the office at [phone] and select the option to reach the on-call provider service. On-call services are shared between multiple [facility] offices and therefore it will not be possible to speak directly with your provider. On-call providers may provide medical advice and recommendations, but are unable to provide refills for maintenance medications.  For all emergency or urgent medical needs after normal business hours, we recommend that you seek care at the closest Urgent Care or Emergency Department to ensure appropriate treatment in a timely  manner.  [facility] [place] has a 24 hour emergency room located on the ground floor for your convenience.    Urgent Concerns During the Business Day Providers are seeing patients from 8AM to 5PM, Monday through Thursday, and 8AM to 12PM on Friday with a busy schedule and are most often not able to respond to non-urgent calls until the end of the day or the next business day. If you should have  URGENT concerns during the day, please call and speak to the nurse or schedule a same day appointment so that we can address your concern without delay.    Thank you, again, for choosing me as your health care partner. I appreciate your trust and look forward to learning more about you.    Alyson Reedy, FNP-C

## 2023-03-08 NOTE — Progress Notes (Signed)
   Acute Office Visit  Subjective:     Patient ID: Irving Bodle, female    DOB: 04-Feb-1991, 32 y.o.   MRN: [id number]  Chief Complaint  Patient presents with   Rash    Rash on bilateral legs, bumps some are "raised" "open" and some are "flat" per patient. Denies itching, just red. States she did a bed bug check at home   Tingling    Bilateral legs, has discussed previously has a MRI scheduled to look into further   Yariela Harsey is a 32 year old female patient who presents today for concerns of a rash. She went to UC last Monday and is concerned because she is still noticing red bumps appearing on her upper thighs. She reports she started using triamcinolone last night and has not noticed an improvement in her rash. She has tried benadryl with some relief and reports it did help the tingling sensation. She reports she washed her bed sheets, used heat to see if there was bed bugs present but did not see any. She reports that she did change detergents. She reports that her son (who lives with her) does not have any rash.   Review of Systems  Constitutional:  Negative for chills, fever and malaise/fatigue.  Respiratory:  Negative for cough and shortness of breath.   Cardiovascular:  Negative for chest pain, palpitations, orthopnea and leg swelling.  Gastrointestinal:  Negative for abdominal pain, nausea and vomiting.  Musculoskeletal:  Negative for myalgias.  Skin:  Positive for rash. Negative for itching.  Neurological:  Positive for tingling.  Endo/Heme/Allergies:  Negative for environmental allergies. Does not bruise/bleed easily.      Objective:    BP 121/73   Pulse 83   Ht 5\' 6"  (1.676 m)   Wt 222 lb 8 oz (100.9 kg)   LMP 02/12/2023   SpO2 99%   BMI 35.91 kg/m   Physical Exam Vitals reviewed.  Constitutional:      Appearance: Normal appearance.  Cardiovascular:     Rate and Rhythm: Normal rate and regular rhythm.     Heart sounds: Normal heart sounds.  Pulmonary:      Effort: Pulmonary effort is normal.     Breath sounds: Normal breath sounds.  Skin:    Findings: Rash present. Rash is macular (mostly on upper legs/buttocks).       Neurological:     Mental Status: She is alert.  Psychiatric:        Mood and Affect: Mood normal.        Behavior: Behavior normal.     Assessment & Plan:   1. Rash Patient is a pleasant 32 year old female patient who presents today for an acute rash. She went to UC on 11/11 for this rash and reached out for concerns regarding this ongoing rash last week. Prescribed triamcinolone cream to be applied sparingly to affected areas. Patient reports she started using this cream last night and does not notice an improvement. Physical exam remarkable for 1mm macules randomly dispersed on her upper thighs and buttocks. Etiology unclear, appears consistent with bedbug bites- however, patient has cleaned her apartment and looked for any signs of bedbugs. Denies itching. Advised her to continue using triamcinolone cream. If no improvement by Friday, would be reasonable to refer to dermatology.   Return if symptoms worsen or fail to improve.  Alyson Reedy, FNP

## 2023-03-10 ENCOUNTER — Encounter (HOSPITAL_BASED_OUTPATIENT_CLINIC_OR_DEPARTMENT_OTHER): Payer: Self-pay | Admitting: Family Medicine

## 2023-03-14 NOTE — Telephone Encounter (Signed)
[facility] Berkley Harvey: [id number] exp. 03/14/2023-05/13/2023 for GI

## 2023-03-22 ENCOUNTER — Other Ambulatory Visit: Payer: MEDICAID

## 2023-03-26 ENCOUNTER — Other Ambulatory Visit: Payer: Self-pay

## 2023-03-26 ENCOUNTER — Emergency Department (HOSPITAL_COMMUNITY)
Admission: EM | Admit: 2023-03-26 | Discharge: 2023-03-27 | Discharge status: Against Medical Advice | Payer: MEDICAID | Attending: Emergency Medicine | Admitting: Emergency Medicine

## 2023-03-26 ENCOUNTER — Encounter (HOSPITAL_COMMUNITY): Payer: Self-pay | Admitting: *Deleted

## 2023-03-26 ENCOUNTER — Emergency Department (HOSPITAL_COMMUNITY): Payer: MEDICAID

## 2023-03-26 DIAGNOSIS — Z5321 Procedure and treatment not carried out due to patient leaving prior to being seen by health care provider: Secondary | ICD-10-CM | POA: Insufficient documentation

## 2023-03-26 DIAGNOSIS — R000 Tachycardia, unspecified: Secondary | ICD-10-CM | POA: Insufficient documentation

## 2023-03-26 LAB — CBC
HCT: 38.5 % (ref 36.0–46.0)
Hemoglobin: 12.5 g/dL (ref 12.0–15.0)
MCH: 28.3 pg (ref 26.0–34.0)
MCHC: 32.5 g/dL (ref 30.0–36.0)
MCV: 87.1 fL (ref 80.0–100.0)
Platelets: 334 10*3/uL (ref 150–400)
RBC: 4.42 MIL/uL (ref 3.87–5.11)
RDW: 13.3 % (ref 11.5–15.5)
WBC: 9.3 10*3/uL (ref 4.0–10.5)
nRBC: 0 % (ref 0.0–0.2)

## 2023-03-26 LAB — TROPONIN I (HIGH SENSITIVITY): Troponin I (High Sensitivity): 2 ng/L (ref <18)

## 2023-03-26 LAB — BASIC METABOLIC PANEL
Anion gap: 9 (ref 5–15)
BUN: 6 mg/dL (ref 6–20)
CO2: 23 mmol/L (ref 22–32)
Calcium: 9.4 mg/dL (ref 8.9–10.3)
Chloride: 107 mmol/L (ref 98–111)
Creatinine, Ser: 0.7 mg/dL (ref 0.44–1.00)
GFR, Estimated: >60 mL/min (ref >60)
Glucose, Bld: 107 mg/dL — ABNORMAL HIGH (ref 70–99)
Potassium: 3.8 mmol/L (ref 3.5–5.1)
Sodium: 139 mmol/L (ref 135–145)

## 2023-03-26 LAB — HCG, SERUM, QUALITATIVE: Preg, Serum: NEGATIVE

## 2023-03-26 NOTE — ED Triage Notes (Addendum)
The pt has had a rapid heart arte intermittent since Tuesday   no pain or minor fleeting pain  lmp  November 22nd

## 2023-03-26 NOTE — ED Notes (Signed)
Pt stated she was leaving due to wait, will go to UC in the morning

## 2023-03-26 NOTE — ED Notes (Signed)
Pt transported to xray 

## 2023-03-27 ENCOUNTER — Encounter (HOSPITAL_BASED_OUTPATIENT_CLINIC_OR_DEPARTMENT_OTHER): Payer: Self-pay | Admitting: Family Medicine

## 2023-03-31 ENCOUNTER — Other Ambulatory Visit (HOSPITAL_BASED_OUTPATIENT_CLINIC_OR_DEPARTMENT_OTHER): Payer: Self-pay

## 2023-03-31 ENCOUNTER — Other Ambulatory Visit (HOSPITAL_COMMUNITY)
Admission: RE | Admit: 2023-03-31 | Discharge: 2023-03-31 | Disposition: A | Discharge status: Home / Self Care | Payer: MEDICAID | Source: Ambulatory Visit | Attending: Family Medicine | Admitting: Family Medicine

## 2023-03-31 DIAGNOSIS — N760 Acute vaginitis: Secondary | ICD-10-CM | POA: Diagnosis not present

## 2023-03-31 DIAGNOSIS — Z113 Encounter for screening for infections with a predominantly sexual mode of transmission: Secondary | ICD-10-CM

## 2023-03-31 DIAGNOSIS — B9689 Other specified bacterial agents as the cause of diseases classified elsewhere: Secondary | ICD-10-CM | POA: Insufficient documentation

## 2023-04-04 ENCOUNTER — Telehealth (HOSPITAL_BASED_OUTPATIENT_CLINIC_OR_DEPARTMENT_OTHER): Payer: Self-pay | Admitting: *Deleted

## 2023-04-04 LAB — CERVICOVAGINAL ANCILLARY ONLY
Bacterial Vaginitis (gardnerella): POSITIVE — AB
Candida Glabrata: NEGATIVE
Candida Vaginitis: NEGATIVE
Chlamydia: NEGATIVE
Comment: NEGATIVE
Comment: NEGATIVE
Comment: NEGATIVE
Comment: NEGATIVE
Comment: NEGATIVE
Comment: NORMAL
Neisseria Gonorrhea: NEGATIVE
Trichomonas: NEGATIVE

## 2023-04-04 NOTE — Telephone Encounter (Signed)
Copied from CRM [id number]. Topic: Clinical - Lab/Test Results >> Apr 04, 2023 12:48 PM Deaijah H wrote: Reason for CRM: [name] with state health department needing to get in contact with provider or nurse regarding syphilis test  / Callback # [phone]

## 2023-04-04 NOTE — Telephone Encounter (Signed)
Spoke with Miranda Robertson, from health dept. He was questioning if patients RPR test had resulted, has she presented to the health department for the same testing. Testing not back yet, will consult with patient with next steps once tests come back. If test is positive, lab will automatically run test for antibodies to confirm true positive or negative test result.

## 2023-04-05 ENCOUNTER — Encounter (HOSPITAL_BASED_OUTPATIENT_CLINIC_OR_DEPARTMENT_OTHER): Payer: Self-pay | Admitting: Family Medicine

## 2023-04-05 ENCOUNTER — Other Ambulatory Visit (HOSPITAL_BASED_OUTPATIENT_CLINIC_OR_DEPARTMENT_OTHER): Payer: Self-pay | Admitting: Family Medicine

## 2023-04-05 DIAGNOSIS — B9689 Other specified bacterial agents as the cause of diseases classified elsewhere: Secondary | ICD-10-CM

## 2023-04-05 LAB — RPR: RPR Ser Ql: REACTIVE — AB

## 2023-04-05 LAB — RPR, QUANT+TP ABS (REFLEX)
Rapid Plasma Reagin, Quant: 1:1 {titer} — ABNORMAL HIGH
T Pallidum Abs: NONREACTIVE

## 2023-04-05 MED ORDER — METRONIDAZOLE 0.75 % EX GEL: 1.0000 | Freq: Every day | CUTANEOUS | 0 refills | Status: AC

## 2023-04-05 NOTE — Progress Notes (Signed)
Review of vaginal swab- positive for BV. Negative STI testing. Still awaiting RPR results. Sent in flagyl vaginal gel to pharmacy on file.

## 2023-04-06 ENCOUNTER — Encounter (HOSPITAL_BASED_OUTPATIENT_CLINIC_OR_DEPARTMENT_OTHER): Payer: Self-pay | Admitting: Family Medicine

## 2023-04-28 ENCOUNTER — Ambulatory Visit
Admission: RE | Admit: 2023-04-28 | Discharge: 2023-04-28 | Disposition: A | Discharge status: Home / Self Care | Payer: MEDICAID | Source: Ambulatory Visit | Attending: Internal Medicine | Admitting: Internal Medicine

## 2023-04-28 VITALS — BP 112/76 | HR 80 | Temp 98.5°F | Resp 16 | Ht 66.0 in | Wt 215.0 lb

## 2023-04-28 DIAGNOSIS — N949 Unspecified condition associated with female genital organs and menstrual cycle: Secondary | ICD-10-CM | POA: Diagnosis not present

## 2023-04-28 DIAGNOSIS — K5900 Constipation, unspecified: Secondary | ICD-10-CM

## 2023-04-28 DIAGNOSIS — R1084 Generalized abdominal pain: Secondary | ICD-10-CM | POA: Diagnosis not present

## 2023-04-28 MED ORDER — POLYETHYLENE GLYCOL 3350 17 GM/SCOOP PO POWD
1.0000 | Freq: Once | ORAL | 0 refills | Status: AC
Start: 2023-04-28 — End: 2023-04-28

## 2023-04-28 MED ORDER — DOXYCYCLINE HYCLATE 100 MG PO CAPS: 100.0000 mg | ORAL_CAPSULE | Freq: Two times a day (BID) | ORAL | 0 refills | Status: AC

## 2023-04-28 MED ORDER — DOCUSATE SODIUM 100 MG PO CAPS: 100.0000 mg | ORAL_CAPSULE | Freq: Every day | ORAL | 0 refills | Status: DC

## 2023-04-28 NOTE — Discharge Instructions (Signed)
Your abdominal pain/physical exam findings are consistent with constipation. Start taking MiraLAX 1-2 times daily (12 hours between doses) until you are able to have a soft normal bowel movement.  Once you are able to have a soft normal bowel movement, use Miralax only once a day for 1-2 days, then as needed.  Increase the amount of fiber you are eating by eating more fruits, vegetables, and whole grains.  Increase your water intake to at least 8 cups of water per day to maintain good hydration.  Take Colace stool softener daily to prevent constipation in the future. You may purchase colace over the counter and take this once daily to help keep stools soft.   If you have not had a bowel movement in the next 2 to 3 days, please return to urgent care.  If you develop any new or worsening symptoms that are severe, please go to the emergency room for further evaluation.

## 2023-04-28 NOTE — ED Triage Notes (Signed)
 Pt presents with generalized abdominal pain x 1.5 months. Pt did follow-up with primary care concerning these symptoms. Pt states she did the cytology swab and was BV+. Applied prescribed gel called into the pharmacy with little to no improvement. Reports a rash in between vagina and rectum. Hurts every time I wipe shortly after prescribed gel was applied. Pt currently rates her abdominal pain an 8/10. Describes pain as a knot. Pt to follow-up with her OB-GYN in February.

## 2023-04-28 NOTE — ED Notes (Signed)
 This RN chaperone for pt assessment by Marcelino Duster NP.

## 2023-04-28 NOTE — ED Provider Notes (Addendum)
 [facility]    CSN: [id number] Arrival date & time: 04/28/23  1811      History   Chief Complaint Chief Complaint  Patient presents with   Abdominal Pain    Entered by patient    HPI Miranda Robertson is a 33 y.o. female.   Miranda Robertson is a 33 y.o. female presenting for chief complaint of generalized abdominal pain that has been present for the last approximately 1.5 months.  Pain is not localized and is to the generalized abdomen.  She states that she feels as though there is a knot to the abdomen and that something needs to come out.  She admits that her bowel movements have been less frequent than normal and wonders if she could be constipated.  Denies recent diarrhea, nausea, vomiting, fevers, chills, cough, congestion, flank pain, urinary symptoms, and vaginal discharge.  Last menstrual cycle was April 05, 2023.  Denies history of abdominal surgeries. No blood or mucous to the stools recently. Last bowel movement was a couple of days ago and small/hard.   She additionally reports pain with wiping and after intercourse to the perineum that started approximately 2 weeks ago. She feels as though there may be a rash to the perineum.  She was recently tested for STDs at primary care and states this does not feel like a herpes outbreak (history of HSV). She denies use of recent new laundry detergents, soaps, or other personal hygiene products. Rash is not itchy.    Abdominal Pain   Past Medical History:  Diagnosis Date   Allergy    Anemia    Anxiety    Asthma    Childhood   Blood transfusion without reported diagnosis    Depression    HSV (herpes simplex virus) infection    Mental disorder    bipolar, depression, mild menatl retardation, suicidal thoughts in 2010   Substance abuse [facility])     Patient Active Problem List   Diagnosis Date Noted   PTSD (post-traumatic stress disorder) 12/01/2022   Bipolar 1 disorder, mixed, partial remission (HCC) 04/28/2021    Bipolar I disorder (HCC) 11/27/2019   Attention deficit hyperactivity disorder (ADHD), 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   Previous cesarean section 05/22/2014   Gestational hypertension without significant proteinuria, antepartum 05/16/2014   Gestational hypertension    H/O cesarean section complicating pregnancy 05/06/2014   Pregnant 05/06/2014   Drug use affecting pregnancy in third trimester 04/30/2014   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   Lost custody of children 04/30/2014   Previous cesarean delivery, antepartum 04/30/2014   Depressive disorder, 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 Authorizing Provider  docusate sodium  (COLACE) 100 MG capsule Take 1 capsule (100 mg total) by mouth daily. 04/28/23  Yes Enedelia Dorna HERO, FNP  doxycycline  (VIBRAMYCIN ) 100 MG capsule Take 1 capsule (100 mg total) by mouth 2 (two) times daily for 7 days. 04/28/23 05/05/23 Yes Josely Moffat, Dorna HERO, FNP  FLUoxetine  (PROZAC ) 20 MG capsule Take 1 capsule (20 mg total) by mouth daily. 07/20/22   Harl Zane BRAVO, NP  Galcanezumab -gnlm (EMGALITY ) 120 MG/ML SOAJ Inject 1 Pen into the skin every 30 (thirty) days. 01/05/23   Rush Nest, MD  lurasidone  (LATUDA ) 80 MG TABS tablet Take 1 tablet (80 mg total) by mouth daily with breakfast. 07/20/22   Harl Zane E, NP  Montelukast Sodium (SINGULAIR PO) Take by mouth.     [provider]  rizatriptan  (MAXALT ) 5 MG tablet Take 1 tablet (5 mg total) by mouth as needed for migraine. May repeat in 2 hours if needed 01/05/23   Rush Nest, MD  triamcinolone  cream (KENALOG ) 0.5 % Apply 1 Application topically 2 (two) times daily. To affected areas. 03/04/23   Towana Small, FNP    Family History Family History  Problem Relation Age of Onset   Headache Father    Alcohol abuse Father    Drug abuse Father    Headache Sister    ADD / ADHD Sister    Anxiety disorder Sister    Depression Sister    Obesity Paternal Grandmother    COPD Paternal Aunt     Social History Social History   Tobacco Use   Smoking status: Never   Smokeless tobacco: Never  Vaping Use   Vaping status: Never Used  Substance Use Topics   Alcohol use: Yes    Comment: occasionally   Drug use: Yes    Types: Marijuana     Allergies   Patient has no known allergies.   Review of Systems Review of Systems  Gastrointestinal:  Positive for abdominal pain.  Per HPI   Physical Exam Triage Vital Signs ED Triage Vitals  Encounter Vitals Group     BP 04/28/23 1835 112/76     Systolic BP Percentile --      Diastolic BP Percentile --      Pulse Rate 04/28/23 1835 80     Resp 04/28/23 1835 16     Temp 04/28/23 1835 98.5 F (36.9 C)     Temp Source 04/28/23 1835 Oral     SpO2 04/28/23 1835 98 %     Weight 04/28/23 1834 215 lb (97.5 kg)     Height 04/28/23 1834 5' 6 (1.676 m)     Head Circumference --      Peak Flow --      Pain Score 04/28/23 1834 8     Pain Loc --      Pain Education --      Exclude from Growth Chart --    No data found.  Updated Vital Signs BP 112/76 (BP Location: Right Arm)   Pulse 80   Temp 98.5 F (36.9 C) (Oral)   Resp 16   Ht 5' 6 (1.676 m)   Wt 215 lb (97.5 kg)   LMP 04/05/2023 (Exact Date)   SpO2 98%   BMI 34.70 kg/m   Visual Acuity Right Eye Distance:   Left Eye Distance:   Bilateral Distance:    Right Eye Near:    Left Eye Near:    Bilateral Near:  Physical Exam Vitals and nursing note reviewed.  Constitutional:      Appearance: She is not ill-appearing or toxic-appearing.  HENT:     Head: Normocephalic and atraumatic.     Right Ear: Hearing and external ear normal.     Left Ear: Hearing and external ear normal.     Nose: Nose normal.     Mouth/Throat:     Lips: Pink.  Eyes:     General: Lids are normal. Vision grossly intact. Gaze aligned appropriately.     Extraocular Movements: Extraocular movements intact.     Conjunctiva/sclera: Conjunctivae normal.  Cardiovascular:     Rate and Rhythm: Normal rate and regular rhythm.     Heart sounds: Normal heart sounds, S1 normal and S2 normal.  Pulmonary:     Effort: Pulmonary effort is normal. No respiratory distress.     Breath sounds: Normal breath sounds and air entry.  Abdominal:     General: Abdomen is flat. Bowel sounds are normal.     Palpations: Abdomen is soft.     Tenderness: There is no abdominal tenderness. There is no right CVA tenderness, left CVA tenderness, guarding or rebound. Negative signs include Murphy's sign and McBurney's sign.     Hernia: No hernia is present.  Genitourinary:    Vagina: No signs of injury and foreign body. Erythema and tenderness present. No vaginal discharge or bleeding.     Comments: Herpetic appearing lesion with surrounding erythema and mild soft tissue swelling to the inferior vagina near the perineum Very tender when inspecting the labia.  Musculoskeletal:     Cervical back: Neck supple.  Skin:    General: Skin is warm and dry.     Capillary Refill: Capillary refill takes less than 2 seconds.     Findings: No rash.  Neurological:     General: No focal deficit present.     Mental Status: She is alert and oriented to person, place, and time. Mental status is at baseline.     Cranial Nerves: No dysarthria or facial asymmetry.  Psychiatric:        Mood and Affect: Mood normal.        Speech:  Speech normal.        Behavior: Behavior normal.        Thought Content: Thought content normal.        Judgment: Judgment normal.      UC Treatments / Results  Labs (all labs ordered are listed, but only abnormal results are displayed) Labs Reviewed - No data to display  EKG   Radiology No results found.  Procedures Procedures (including critical care time)  Medications Ordered in UC Medications - No data to display  Initial Impression / Assessment and Plan / UC Course  I have reviewed the triage vital signs and the nursing notes.  Pertinent labs & imaging results that were available during my care of the patient were reviewed by me and considered in my medical decision making (see chart for details).   1. Perineal discomfort in female Vaginal lesion likely herpetic. Valtrex  likely will not help very much given delayed presentation. I suspect secondary bacterial infection component due to persistent pain, swelling, and slight purulence to lesion, therefore will cover for bacteria with doxycycline  BID for 7 days. GYN follow-up encouraged as needed should symptoms fail to improve.  2. Constipation Abdominal pain is likely secondary to constipation.  MiraLAX  and Colace ordered. Low suspicion for bowel obstruction/intraabdominal emergency given lack of peritoneal  signs to abdominal exam. Encouraged increased fiber and fluid intake. PCP follow-up.   Counseled patient on potential for adverse effects with medications prescribed/recommended today, strict ER and return-to-clinic precautions discussed, patient verbalized understanding.    Final Clinical Impressions(s) / UC Diagnoses   Final diagnoses:  Constipation, unspecified constipation type  Generalized abdominal pain  Perineal discomfort in female     Discharge Instructions      Your abdominal pain/physical exam findings are consistent with constipation. Start taking MiraLAX  1-2 times daily (12 hours between doses)  until you are able to have a soft normal bowel movement.  Once you are able to have a soft normal bowel movement, use Miralax  only once a day for 1-2 days, then as needed.  Increase the amount of fiber you are eating by eating more fruits, vegetables, and whole grains.  Increase your water intake to at least 8 cups of water per day to maintain good hydration.  Take Colace stool softener daily to prevent constipation in the future. You may purchase colace over the counter and take this once daily to help keep stools soft.   If you have not had a bowel movement in the next 2 to 3 days, please return to urgent care.  If you develop any new or worsening symptoms that are severe, please go to the emergency room for further evaluation.        ED Prescriptions     Medication Sig Dispense Auth. Provider   polyethylene glycol powder (GLYCOLAX /MIRALAX ) 17 GM/SCOOP powder Take 255 g by mouth once for 1 dose. 255 g Enedelia Going M, FNP   docusate sodium  (COLACE) 100 MG capsule Take 1 capsule (100 mg total) by mouth daily. 30 capsule Enedelia Going HERO, FNP   doxycycline  (VIBRAMYCIN ) 100 MG capsule Take 1 capsule (100 mg total) by mouth 2 (two) times daily for 7 days. 14 capsule Enedelia Going HERO, FNP      PDMP not reviewed this encounter.   Enedelia Going HERO, FNP 05/04/23 1638    Enedelia Going HERO, FNP 05/04/23 [id number]

## 2023-05-28 ENCOUNTER — Encounter: Payer: Self-pay | Admitting: Family Medicine

## 2023-05-30 ENCOUNTER — Other Ambulatory Visit: Payer: Self-pay | Admitting: Family Medicine

## 2023-05-30 DIAGNOSIS — R21 Rash and other nonspecific skin eruption: Secondary | ICD-10-CM

## 2023-06-07 ENCOUNTER — Ambulatory Visit
Admission: RE | Admit: 2023-06-07 | Discharge: 2023-06-07 | Disposition: A | Discharge status: Home / Self Care | Payer: MEDICAID | Source: Ambulatory Visit | Attending: Family Medicine | Admitting: Family Medicine

## 2023-06-07 ENCOUNTER — Other Ambulatory Visit: Payer: Self-pay

## 2023-06-07 VITALS — BP 120/78 | HR 65 | Temp 98.2°F | Resp 16 | Ht 66.0 in | Wt 215.0 lb

## 2023-06-07 DIAGNOSIS — R21 Rash and other nonspecific skin eruption: Secondary | ICD-10-CM

## 2023-06-07 DIAGNOSIS — L739 Follicular disorder, unspecified: Secondary | ICD-10-CM | POA: Diagnosis not present

## 2023-06-07 MED ORDER — CEPHALEXIN 500 MG PO CAPS: 500.0000 mg | ORAL_CAPSULE | Freq: Three times a day (TID) | ORAL | 0 refills | Status: AC

## 2023-06-07 NOTE — ED Provider Notes (Signed)
[facility]    CSN: [id number] Arrival date & time: 06/07/23  1043      History   Chief Complaint Chief Complaint  Patient presents with   Rash    I have talked with my primary care doctor she stated I needed to do another syphilis blood test. Would like to have that done. I don't think that's the issue tho. I'm having more and more bumps appear on my arms. And tingling feeling. - Entered by patient    HPI Miranda Robertson is a 33 y.o. female.   The history is provided by the patient.  Rash Has had a rash since December 2024 described as small bumps on her extremities and trunk.  Rash does not itch or hurt.  A little tingly last p.m.  Denies new medications or over-the-counter supplements, change in hygiene products, medications, household members with similar rash, known exposures.  Was seen by PCP for same.  States she had a blood test done for syphilis and it came back reactive at a 1-1, her doctor told her to have the test repeated in 4 weeks she would like to have that done today.   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   Previous cesarean section 05/22/2014   Gestational hypertension without significant proteinuria, antepartum 05/16/2014   Gestational hypertension    H/O cesarean section complicating pregnancy 05/06/2014   Pregnant 05/06/2014   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 04/30/2014   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   Lost custody of children 04/30/2014   Previous cesarean delivery, antepartum 04/30/2014   Depressive disorder, 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 Authorizing Provider  cephALEXin (KEFLEX) 500 MG capsule Take 1 capsule (500 mg total) by mouth 3 (three) times daily for 10 days. 06/07/23 06/17/23 Yes Meliton Rattan, PA  docusate sodium (COLACE) 100 MG capsule Take 1 capsule (100 mg total) by mouth daily. 04/28/23   Carlisle Beers, FNP  FLUoxetine (  PROZAC) 20 MG capsule Take 1 capsule (20 mg total) by mouth daily. 07/20/22   Shanna Cisco, NP  Galcanezumab-gnlm (EMGALITY) 120 MG/ML SOAJ Inject 1 Pen into the skin every 30 (thirty) days. 01/05/23   Ocie Doyne, MD  lurasidone (LATUDA) 80 MG TABS tablet Take 1 tablet (80 mg total) by mouth daily with breakfast. 07/20/22   Toy Cookey E, NP  Montelukast Sodium (SINGULAIR PO) Take by mouth.    [provider]  rizatriptan (MAXALT) 5 MG tablet Take 1 tablet (5 mg total) by mouth as needed for migraine. May repeat in 2 hours if needed 01/05/23   Ocie Doyne, MD  triamcinolone cream (KENALOG) 0.5 % Apply 1 Application topically 2 (two) times daily. To affected areas. 03/04/23   Alyson Reedy, FNP    Family History Family History  Problem Relation Age of Onset   Headache Father     Alcohol abuse Father    Drug abuse Father    Headache Sister    ADD / ADHD Sister    Anxiety disorder Sister    Depression Sister    Obesity Paternal Grandmother    COPD Paternal Aunt     Social History Social History   Tobacco Use   Smoking status: Never   Smokeless tobacco: Never  Vaping Use   Vaping status: Never Used  Substance Use Topics   Alcohol use: Yes    Comment: occasionally   Drug use: Yes    Types: Marijuana     Allergies   Patient has no known allergies.   Review of Systems Review of Systems  Skin:  Positive for rash.     Physical Exam Triage Vital Signs ED Triage Vitals  Encounter Vitals Group     BP 06/07/23 1057 120/78     Systolic BP Percentile --      Diastolic BP Percentile --      Pulse Rate 06/07/23 1057 65     Resp 06/07/23 1057 16     Temp 06/07/23 1057 98.2 F (36.8 C)     Temp Source 06/07/23 1057 Oral     SpO2 06/07/23 1057 98 %     Weight 06/07/23 1120 215 lb (97.5 kg)     Height 06/07/23 1120 5\' 6"  (1.676 m)     Head Circumference --      Peak Flow --      Pain Score 06/07/23 1120 0     Pain Loc --      Pain Education --      Exclude from Growth Chart --    No data found.  Updated Vital Signs BP 120/78 (BP Location: Right Arm)   Pulse 65   Temp 98.2 F (36.8 C) (Oral)   Resp 16   Ht 5\' 6"  (1.676 m)   Wt 215 lb (97.5 kg)   LMP 05/31/2023 (Exact Date)   SpO2 98%   BMI 34.70 kg/m   Visual Acuity Right Eye Distance:   Left Eye Distance:   Bilateral Distance:    Right Eye Near:   Left Eye Near:    Bilateral Near:     Physical Exam Vitals and nursing note reviewed.  Constitutional:      Appearance: She is not ill-appearing.  HENT:     Head: Normocephalic and atraumatic.     Mouth/Throat:     Mouth: Mucous membranes are moist.  Cardiovascular:     Rate and Rhythm: Normal rate and regular rhythm.  Pulmonary:  Effort: Pulmonary effort is normal. No respiratory distress.     Breath sounds: Normal  breath sounds.  Musculoskeletal:        General: Normal range of motion.  Skin:    General: Skin is warm.     Findings: Rash (Scattered papular rash on extremities and trunk, some lesions are excoriated, few lesions appear pustular, no specific pattern or distribution , does not affect palms or soles) present.  Neurological:     Mental Status: She is alert.  Psychiatric:        Mood and Affect: Mood normal.      UC Treatments / Results  Labs (all labs ordered are listed, but only abnormal results are displayed) Labs Reviewed  RPR    EKG   Radiology No results found.  Procedures Procedures (including critical care time)  Medications Ordered in UC Medications - No data to display  Initial Impression / Assessment and Plan / UC Course  I have reviewed the triage vital signs and the nursing notes.  Pertinent labs & imaging results that were available during my care of the patient were reviewed by me and considered in my medical decision making (see chart for details).     Will repeat patient's RPR at her request she was instructed to follow-up with her primary care provider.  Suspect the rash may be folliculitis we will treat with a round of antibiotics Final Clinical Impressions(s) / UC Diagnoses   Final diagnoses:  Rash  Folliculitis     Discharge Instructions      Follow-up with your doctor if the rash fails to improve in 1 week  Test results will be released to your MyChart account We will contact you if anything is positive and requires treatment.    ED Prescriptions     Medication Sig Dispense Auth. Provider   cephALEXin (KEFLEX) 500 MG capsule Take 1 capsule (500 mg total) by mouth 3 (three) times daily for 10 days. 30 capsule Meliton Rattan, [place]      PDMP not reviewed this encounter.   Meliton Rattan, [place] 06/07/23 1139

## 2023-06-07 NOTE — Discharge Instructions (Signed)
Follow-up with your doctor if the rash fails to improve in 1 week  Test results will be released to your MyChart account We will contact you if anything is positive and requires treatment.

## 2023-06-07 NOTE — ED Triage Notes (Signed)
Pt presents with complaints of generalized rash (bilateral arms & legs, abdomen) since December 2024. Pt currently denies pain and denies taking OTC medications for symptoms experienced.

## 2023-06-08 ENCOUNTER — Encounter: Payer: Self-pay | Admitting: Family Medicine

## 2023-06-08 ENCOUNTER — Ambulatory Visit: Payer: Self-pay | Admitting: Family Medicine

## 2023-06-08 ENCOUNTER — Telehealth: Payer: MEDICAID | Admitting: Family Medicine

## 2023-06-08 VITALS — Ht 66.0 in | Wt 215.0 lb

## 2023-06-08 DIAGNOSIS — N6315 Unspecified lump in the right breast, overlapping quadrants: Secondary | ICD-10-CM

## 2023-06-08 DIAGNOSIS — R21 Rash and other nonspecific skin eruption: Secondary | ICD-10-CM

## 2023-06-08 LAB — RPR, QUANT+TP ABS (REFLEX)
Rapid Plasma Reagin, Quant: 1:2 {titer} — ABNORMAL HIGH
T Pallidum Abs: REACTIVE — AB

## 2023-06-08 LAB — SYPHILIS: RPR W/REFLEX TO RPR TITER AND TREPONEMAL ANTIBODIES, TRADITIONAL SCREENING AND DIAGNOSIS ALGORITHM: RPR Ser Ql: REACTIVE — AB

## 2023-06-08 MED ORDER — SULFAMETHOXAZOLE-TRIMETHOPRIM 800-160 MG PO TABS: 1.0000 | ORAL_TABLET | Freq: Two times a day (BID) | ORAL | 0 refills | Status: AC

## 2023-06-08 NOTE — Telephone Encounter (Signed)
Chief Complaint: Change in rash Symptoms: Change in rash appearance, itching Frequency: since yesterday with start of Abx Pertinent Negatives: Patient denies fever, throat/tongue swelling, difficulty breathing Disposition: [] ED /[] Urgent Care (no appt availability in office) / [x] Appointment(In office/virtual)/ []  [place] [facility]/ [] Home Care/ [] Refused Recommended Disposition /[] [place] [facility]/ []  Follow-up with PCP Additional Notes: Patient called in stating she has been seen in office since December for a rash on her body. Rash has not improved so patient went to Urgent Care yesterday and provider suspected hair follicle infection, place patient on Keflex. Patient took first dose of Keflex last night and states this morning she is having moderate itching with rash, which she was not having before. Patient denies any throat or tongue swelling, or difficulty breathing. Patient states UC provider also ran a panel on her to further follow up on STD testing. Patient scheduled for virtual appt for today for follow up.   Reason for Disposition  Ring-like appearance of rash (or ask: does it look like a  "target" or "bulls- eye")  Answer Assessment - Initial Assessment Questions 1. APPEARANCE of RASH: "Describe the rash." (e.g., spots, blisters, raised areas, skin peeling, scaly)     Today it's appearing in patches, red 2. SIZE: "How big are the spots?" (e.g., tip of pen, eraser, coin; inches, centimeters)     Patches range in size from nickel, some are lines 3 inches 3. LOCATION: "Where is the rash located?"     Arms, stomach, legs 4. COLOR: "What color is the rash?" (Note: It is difficult to assess rash color in people with darker-colored skin. When this situation occurs, simply ask the caller to describe what they see.)     Red patches 5. ONSET: "When did the rash begin?"     Since December 6. FEVER: "Do you have a fever?" If Yes, ask: "What is your temperature, how was it  measured, and when did it start?"     No 7. ITCHING: "Does the rash itch?" If Yes, ask: "How bad is the itch?" (Scale 1-10; or mild, moderate, severe)     5 8. CAUSE: "What do you think is causing the rash?"     Urgent Care  9. MEDICINE FACTORS: "Have you started any new medicines within the last 2 weeks?" (e.g., antibiotics)      Keflex last night 10. OTHER SYMPTOMS: "Do you have any other symptoms?" (e.g., dizziness, headache, sore throat, joint pain)       No 11. PREGNANCY: "Is there any chance you are pregnant?" "When was your last menstrual period?"       No  Protocols used: Rash or Redness - [facility]

## 2023-06-08 NOTE — Progress Notes (Signed)
Virtual Visit via Video Note  I connected with [name] on 06/08/23 at 10:46 AM by a video enabled telemedicine application and verified that I am speaking with the correct person using two identifiers.  Patient Location: Home Provider Location: Office/Clinic  I discussed the limitations, risks, security, and privacy concerns of performing an evaluation and management service by video and the availability of in person appointments. I also discussed with the patient that there may be a patient responsible charge related to this service. The patient expressed understanding and agreed to proceed.  Subjective: PCP: Miranda Reedy, FNP  Chief Complaint  Patient presents with   Rash    Rash on legs/arms since Dec//UC yesterday--Keflex   Patient presents today for concerns regarding rash that has been present since Dec 2024. Seen by [facility]/07/2023 for ongoing rash on her extremities and trunk. Her RPR testing came back about 2 months ago at a 1:1 ratio. She was asking for them to repeat RPR testing- appears results are not back yet. She was diagnosed with possible folliculitis and given cephalexin TID x10 days.  Patient reports that she has been on Keflex before and has tolerated it fine.  She noticed last night after taking the first dose and showering, she had patches on her arms that were itching.  She did take her Keflex dose this morning.  Denies shortness of breath, lip or tongue swelling, stridor, chest pain, appearance of hives.   Reports noticing a lump under her R breast at the 6 o'clock position. Denies pain, change to skin texture, redness, warmth, swelling, nipple discharge.   ROS: Per HPI  Current Outpatient Medications:    cephALEXin (KEFLEX) 500 MG capsule, Take 1 capsule (500 mg total) by mouth 3 (three) times daily for 10 days., Disp: 30 capsule, Rfl: 0   docusate sodium (COLACE) 100 MG capsule, Take 1 capsule (100 mg total) by mouth daily., Disp: 30 capsule, Rfl: 0    FLUoxetine (PROZAC) 20 MG capsule, Take 1 capsule (20 mg total) by mouth daily., Disp: 30 capsule, Rfl: 3   Galcanezumab-gnlm (EMGALITY) 120 MG/ML SOAJ, Inject 1 Pen into the skin every 30 (thirty) days., Disp: 1.12 mL, Rfl: 6   lurasidone (LATUDA) 80 MG TABS tablet, Take 1 tablet (80 mg total) by mouth daily with breakfast., Disp: 30 tablet, Rfl: 3   Montelukast Sodium (SINGULAIR PO), Take by mouth., Disp: , Rfl:    rizatriptan (MAXALT) 5 MG tablet, Take 1 tablet (5 mg total) by mouth as needed for migraine. May repeat in 2 hours if needed, Disp: 10 tablet, Rfl: 6   triamcinolone cream (KENALOG) 0.5 %, Apply 1 Application topically 2 (two) times daily. To affected areas., Disp: 30 g, Rfl: 3  Observations/Objective: Today's Vitals   06/08/23 1036  Weight: 215 lb (97.5 kg)  Height: 5\' 6"  (1.676 m)  PainSc: 0-No pain   General: Alert and oriented x 4. Speaking in clear and full sentences, no audible heavy breathing, no acute distress.  Sounds alert and appropriately interactive.  Appears well.  Face symmetric.  Extraocular movements intact.  Pupils equal and round.  No nasal flaring or accessory muscle use visualized.  Assessment and Plan: 1. Rash (Primary) Patient reports that she started taking Keflex yesterday for possible folliculitis.  She noticed that after taking her first dose yesterday there were patches on her arms and itching was present at those sites.  She reports she has tolerated Keflex before and penicillins.  Physical exam limited due to  quality of video.  A few maculopapular patches present on bilateral forearms.  Will change antibiotic from Keflex to Bactrim for coverage of folliculitis.  Previous dermatology referral was closed due to not accepting new patients.  New dermatology referral placed.  Patient to reach out if symptoms continue or worsen after taking the Bactrim. - Ambulatory referral to Dermatology - sulfamethoxazole-trimethoprim (BACTRIM DS) 800-160 MG tablet; Take 1  tablet by mouth 2 (two) times daily for 7 days.  Dispense: 14 tablet; Refill: 0  2. Breast lump on right side at 6 o'clock position Patient reports that she noticed a lump on the right breast at the 6 o'clock position.  Denies pain with palpation, erythema, warmth, changes to skin texture, fever/chills, nipple discharge, axillary and cervical lymphadenopathy. Unable to assess due to virtual visit and patient verbalized understanding. Will obtain ultrasound of R breast.  - US BREAST COMPLETE UNI RIGHT INC AXILLA; Future   Follow Up Instructions: Return if symptoms worsen or fail to improve.   I discussed the assessment and treatment plan with the patient. The patient was provided an opportunity to ask questions, and all were answered. The patient agreed with the plan and demonstrated an understanding of the instructions.   The patient was advised to call back or seek an in-person evaluation if the symptoms worsen or if the condition fails to improve as anticipated.  The above assessment and management plan was discussed with the patient. The patient verbalized understanding of and has agreed to the management plan.   Miranda Reedy, FNP

## 2023-06-09 ENCOUNTER — Telehealth: Payer: Self-pay

## 2023-06-09 ENCOUNTER — Other Ambulatory Visit (HOSPITAL_COMMUNITY)
Admission: RE | Admit: 2023-06-09 | Discharge: 2023-06-09 | Disposition: A | Discharge status: Home / Self Care | Payer: MEDICAID | Source: Ambulatory Visit | Attending: Obstetrics and Gynecology | Admitting: Obstetrics and Gynecology

## 2023-06-09 ENCOUNTER — Encounter: Payer: Self-pay | Admitting: Obstetrics and Gynecology

## 2023-06-09 ENCOUNTER — Other Ambulatory Visit (HOSPITAL_COMMUNITY): Payer: Self-pay

## 2023-06-09 ENCOUNTER — Encounter: Payer: MEDICAID | Admitting: Obstetrics and Gynecology

## 2023-06-09 ENCOUNTER — Ambulatory Visit: Payer: MEDICAID | Admitting: Obstetrics and Gynecology

## 2023-06-09 ENCOUNTER — Encounter: Payer: Self-pay | Admitting: Family Medicine

## 2023-06-09 VITALS — BP 130/87 | HR 55 | Ht 66.0 in | Wt 210.9 lb

## 2023-06-09 DIAGNOSIS — Z113 Encounter for screening for infections with a predominantly sexual mode of transmission: Secondary | ICD-10-CM

## 2023-06-09 DIAGNOSIS — B3731 Acute candidiasis of vulva and vagina: Secondary | ICD-10-CM | POA: Diagnosis not present

## 2023-06-09 DIAGNOSIS — Z202 Contact with and (suspected) exposure to infections with a predominantly sexual mode of transmission: Secondary | ICD-10-CM | POA: Diagnosis not present

## 2023-06-09 DIAGNOSIS — Z01419 Encounter for gynecological examination (general) (routine) without abnormal findings: Secondary | ICD-10-CM | POA: Insufficient documentation

## 2023-06-09 DIAGNOSIS — B9689 Other specified bacterial agents as the cause of diseases classified elsewhere: Secondary | ICD-10-CM | POA: Insufficient documentation

## 2023-06-09 DIAGNOSIS — N760 Acute vaginitis: Secondary | ICD-10-CM

## 2023-06-09 DIAGNOSIS — Z8619 Personal history of other infectious and parasitic diseases: Secondary | ICD-10-CM | POA: Insufficient documentation

## 2023-06-09 NOTE — Progress Notes (Signed)
WELL-WOMAN PHYSICAL & PAP Patient name: Miranda [facility] MRN [id number]  Date of birth: [birth year] Chief Complaint:   Establish Care (Annual)  History of Present Illness:   Miranda Robertson is a 33 y.o. [id number] Caucasian female being seen today for a routine well-woman exam.  Current complaints: recent positive syphilis test and unexplained rash on arms and legs, possible allergic reaction to Keflex started on for rash. Per patient PCP advised her to stop taking the Keflex, so yesterday was her last dose. She has a new partner who she was not sexually involved with back in 03/2023; with the first (+) RPR/(-) T. [place] result.  PCP: Alyson Reedy, FNP      does desire labs Patient's last menstrual period was 05/31/2023 (exact date). The current method of family planning is condoms.  Last pap 12/09/2017. Results were: normal Last mammogram: n/a. Family h/o breast cancer: No Last colonoscopy: n/a. Family h/o colorectal cancer: No Review of Systems:   Pertinent items are noted in HPI Denies any headaches, blurred vision, fatigue, shortness of breath, chest pain, abdominal pain, abnormal vaginal discharge/itching/odor/irritation, problems with periods, bowel movements, urination, or intercourse unless otherwise stated above. Pertinent History Reviewed:  Reviewed past medical,surgical, social and family history.  Reviewed problem list, medications and allergies. Physical Assessment:   Vitals:   06/09/23 1037  BP: 130/87  Pulse: (!) 55  Weight: 210 lb 14.4 oz (95.7 kg)  Height: 5\' 6"  (1.676 m)  Body mass index is 34.04 kg/m.        Physical Examination:   General appearance - well appearing, and in no distress  Mental status - alert, oriented to person, place, and time  Psych:  She has a normal mood and affect  Skin - warm and dry, normal color, multiple, small, round red and pink lesions on skin in diffuse pattern  Chest - effort normal, all lung fields clear to auscultation  bilaterally  Heart - normal rate and regular rhythm  Neck:  midline trachea, no thyromegaly or nodules  Breasts - breasts appear normal, no suspicious masses, no skin or nipple changes or  axillary nodes  Abdomen - soft, nontender, nondistended, no masses or organomegaly  Pelvic - VULVA: normal appearing vulva with no masses, tenderness or lesions  VAGINA: normal appearing vagina with normal color and discharge, no lesions CERVIX: normal appearing cervix without discharge or lesions, (+) CMT  Thin prep pap is done with HR HPV cotesting  UTERUS: uterus is felt to be normal size, shape, consistency and nontender   ADNEXA: No adnexal masses. (+) tenderness noted on RT.  Rectal - deferred  Extremities:  No swelling or varicosities noted  No results found for this or any previous visit (from the past 24 hours).  Assessment & Plan:  1) Encounter for well woman exam with routine gynecological exam (Primary) - Cytology - PAP( [place])  2) Screening examination for STI - Cervicovaginal ancillary only( [place]) - HIV antibody (with reflex) - Hepatitis C Antibody - Hepatitis B Surface AntiGEN  3) Syphilis contact, untreated - Patient scheduled for tx with [facility] today at 1530 - Referral to Infectious Diseases placed - Per RN automatic state notification will occur  Labs/procedures today: pap and STI screening (excluding RPR)  Mammogram at age 44 or sooner if problems Colonoscopy at age 19 or sooner if problems  Orders Placed This Encounter  Procedures   HIV antibody (with reflex)   Hepatitis C Antibody   Hepatitis B Surface AntiGEN  Meds: No orders of the defined types were placed in this encounter.   Follow-up: Return in about 1 year (around 06/08/2024) for Annual Exam.  Raelyn Mora MSN, CNM 06/09/2023 11:26 AM

## 2023-06-09 NOTE — Telephone Encounter (Signed)
   It is time to renew PA for Emgality-PT has not been seen since starting this medication-please advise on the above questions (I answered only to get all clinical questions to populate to address all at one time) Thanks.

## 2023-06-09 NOTE — Progress Notes (Signed)
Pt. Presents for Annual. Pt. Would like std testing and is due for her pap. Has questions about her recent lab results from 12/13 and 2/18

## 2023-06-10 ENCOUNTER — Encounter: Payer: Self-pay | Admitting: Obstetrics and Gynecology

## 2023-06-10 LAB — HEPATITIS B SURFACE ANTIGEN: Hepatitis B Surface Ag: NEGATIVE

## 2023-06-10 LAB — CERVICOVAGINAL ANCILLARY ONLY
Bacterial Vaginitis (gardnerella): POSITIVE — AB
Candida Glabrata: NEGATIVE
Candida Vaginitis: POSITIVE — AB
Chlamydia: NEGATIVE
Comment: NEGATIVE
Comment: NEGATIVE
Comment: NEGATIVE
Comment: NEGATIVE
Comment: NEGATIVE
Comment: NORMAL
Neisseria Gonorrhea: NEGATIVE
Trichomonas: NEGATIVE

## 2023-06-10 LAB — HIV ANTIBODY (ROUTINE TESTING W REFLEX): HIV Screen 4th Generation wRfx: NONREACTIVE

## 2023-06-10 LAB — HEPATITIS C ANTIBODY: Hep C Virus Ab: NONREACTIVE

## 2023-06-10 MED ORDER — METRONIDAZOLE 0.75 % VA GEL: 1.0000 | Freq: Every day | VAGINAL | 0 refills | Status: DC

## 2023-06-10 MED ORDER — FLUCONAZOLE 150 MG PO TABS: 150.0000 mg | ORAL_TABLET | ORAL | 0 refills | Status: DC

## 2023-06-10 NOTE — Addendum Note (Signed)
Addended by: Kenard Gower on: 06/10/2023 01:01 PM   Modules accepted: Orders

## 2023-06-13 ENCOUNTER — Encounter: Payer: Self-pay | Admitting: Obstetrics and Gynecology

## 2023-06-13 LAB — CYTOLOGY - PAP
Comment: NEGATIVE
Diagnosis: NEGATIVE
High risk HPV: NEGATIVE

## 2023-06-15 ENCOUNTER — Telehealth: Payer: Self-pay

## 2023-06-15 ENCOUNTER — Other Ambulatory Visit: Payer: Self-pay | Admitting: Family Medicine

## 2023-06-15 NOTE — Telephone Encounter (Signed)
 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 Patient also states that [facility] Dept gave her Doxycyline for the bumps but has not started it because she was unsure if it would interact with Bactrim. Patient was encouraged to keep appointment with infectious disease.    Patient verbalized understanding. All questions and concerns have been addressed at this time.

## 2023-06-15 NOTE — Telephone Encounter (Signed)
 LVM requesting a return call regarding lab results. Need to confirm if patient followed up with health department.

## 2023-06-15 NOTE — Telephone Encounter (Signed)
-----   Message from Alyson Reedy sent at 06/15/2023  8:22 AM EST ----- Regarding: RPR + results Can you call her and see if she went to the health department and got treatment for syphilis?

## 2023-06-21 ENCOUNTER — Other Ambulatory Visit: Payer: Self-pay

## 2023-06-21 ENCOUNTER — Ambulatory Visit
Admission: RE | Admit: 2023-06-21 | Discharge: 2023-06-21 | Disposition: A | Discharge status: Home / Self Care | Payer: MEDICAID | Source: Ambulatory Visit | Attending: Family Medicine | Admitting: Family Medicine

## 2023-06-21 VITALS — BP 111/74 | HR 58 | Temp 97.8°F | Resp 18 | Ht 66.0 in | Wt 210.0 lb

## 2023-06-21 DIAGNOSIS — A539 Syphilis, unspecified: Secondary | ICD-10-CM | POA: Diagnosis not present

## 2023-06-21 MED ORDER — PENICILLIN G BENZATHINE 1200000 UNIT/2ML IM SUSY
2.4000 10*6.[IU] | PREFILLED_SYRINGE | Freq: Once | INTRAMUSCULAR | Status: AC
  Administered 2023-06-21: 2.4 10*6.[IU] via INTRAMUSCULAR

## 2023-06-21 NOTE — ED Triage Notes (Signed)
 Pt presents to urgent care for follow-up injection for positive syphilis test. Pt states she was taking the doxycycline orally however has forgot to take multiple doses. Pt prefers to have injection for treatment today. No improvement with rash on skin. Denies pain at this time.

## 2023-06-21 NOTE — ED Provider Notes (Signed)
 [facility]    CSN: [id number] Arrival date & time: 06/21/23  1149      History   Chief Complaint Chief Complaint  Patient presents with   Follow-up    I'm coming in to get a shot for the positive test results - Entered by patient    HPI Miranda Robertson is a 33 y.o. female.   The history is provided by the patient.  Here for treatment of syphilis Syphilis years ago which was treated.  December 2024 had an RPR that was reactive, her RPR quant at that point was 121 and her Treponema pallidum was negative.  Had repeat testing done June 07, 2023 her RPR was reactive her Miranda Robertson was 1-2 and her Treponema pallidum was reactive.  States she [facility] and they treated her with doxycycline for 14 days however she has been unable to take the medicine because it makes, here to get the penicillin injections.  She admits rash otherwise no symptoms.  Past Medical History:  Diagnosis Date   Allergy    Anemia    Anxiety    Asthma    Childhood   Blood transfusion without reported diagnosis    Depression    HSV (herpes simplex virus) infection    Mental disorder    bipolar, depression, mild menatl retardation, suicidal thoughts in 2010   Substance abuse [facility])     Patient Active Problem List   Diagnosis Date Noted   Syphilis contact, untreated 06/09/2023   PTSD (post-traumatic stress disorder) 12/01/2022   Bipolar 1 disorder, mixed, partial remission (HCC) 04/28/2021   Bipolar I disorder (HCC) 11/27/2019   Attention deficit hyperactivity disorder (ADHD), 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   Previous cesarean section 05/22/2014   Gestational hypertension without significant proteinuria, antepartum 05/16/2014   Gestational hypertension    H/O cesarean section complicating pregnancy 05/06/2014   Pregnant 05/06/2014   Drug use affecting pregnancy in third trimester 04/30/2014   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   Lost custody of  children 04/30/2014   Previous cesarean delivery, antepartum 04/30/2014   Depressive disorder, 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 Authorizing Provider  docusate sodium (COLACE) 100 MG capsule Take 1 capsule (100 mg total) by mouth daily. Patient not taking: Reported on 06/09/2023 04/28/23   Carlisle Beers, FNP  FLUoxetine (PROZAC) 20 MG capsule Take 1 capsule (20 mg total) by mouth daily. Patient not taking: Reported on 06/09/2023 07/20/22   Shanna Cisco, NP  Galcanezumab-gnlm [facility]) 120 MG/ML SOAJ Inject 1 Pen into the skin every 30 (thirty) days. Patient  not taking: Reported on 06/09/2023 01/05/23   Ocie Doyne, MD  lurasidone (LATUDA) 80 MG TABS tablet Take 1 tablet (80 mg total) by mouth daily with breakfast. Patient not taking: Reported on 06/09/2023 07/20/22   Shanna Cisco, NP  Montelukast Sodium (SINGULAIR PO) Take by mouth. Patient not taking: Reported on 06/09/2023    [provider]  rizatriptan (MAXALT) 5 MG tablet Take 1 tablet (5 mg total) by mouth as needed for migraine. May repeat in 2 hours if needed Patient not taking: Reported on 06/09/2023 01/05/23   Ocie Doyne, MD  triamcinolone cream (KENALOG) 0.5 % Apply 1 Application topically 2 (two) times daily. To affected areas. Patient not taking: Reported on 06/09/2023 03/04/23   Alyson Reedy, FNP    Family History Family History   Problem Relation Age of Onset   Headache Father    Alcohol abuse Father    Drug abuse Father    Headache Sister    ADD / ADHD Sister    Anxiety disorder Sister    Depression Sister    Obesity Paternal Grandmother    COPD Paternal Aunt     Social History Social History   Tobacco Use   Smoking status: Never   Smokeless tobacco: Never  Vaping Use   Vaping status: Never Used  Substance Use Topics   Alcohol use: Yes    Comment: occasionally   Drug use: Yes    Types: Marijuana     Allergies   Patient has no known allergies.   Review of Systems Review of Systems   Physical Exam Triage Vital Signs ED Triage Vitals  Encounter Vitals Group     BP 06/21/23 1206 111/74     Systolic BP Percentile --      Diastolic BP Percentile --      Pulse Rate 06/21/23 1206 (!) 58     Resp 06/21/23 1206 18     Temp 06/21/23 1206 97.8 F (36.6 C)     Temp Source 06/21/23 1206 Oral     SpO2 06/21/23 1206 98 %     Weight 06/21/23 1204 210 lb (95.3 kg)     Height 06/21/23 1204 5\' 6"  (1.676 m)     Head Circumference --      Peak Flow --      Pain Score 06/21/23 1204 0     Pain Loc --      Pain Education --      Exclude from Growth Chart --    No data found.  Updated Vital Signs BP 111/74 (BP Location: Right Arm)   Pulse (!) 58   Temp 97.8 F (36.6 C) (Oral)   Resp 18   Ht 5\' 6"  (1.676 m)   Wt 210 lb (95.3 kg)   LMP 05/31/2023 (Exact Date)   SpO2 98%   BMI 33.89 kg/m   Visual Acuity Right Eye Distance:   Left Eye Distance:   Bilateral Distance:    Right Eye Near:   Left Eye Near:    Bilateral Near:     Physical Exam Constitutional:      Appearance: She is not ill-appearing.  HENT:     Head: Normocephalic.  Cardiovascular:     Rate and Rhythm: Normal rate.  Pulmonary:     Effort: Pulmonary effort is normal. No respiratory distress.  Skin:    Findings: Rash (Oriented papular rash on upper extremities does not involve palms) present.  Neurological:      Mental Status: She is  alert.  Psychiatric:        Mood and Affect: Mood normal.      UC Treatments / Results  Labs (all labs ordered are listed, but only abnormal results are displayed) Labs Reviewed - No data to display  EKG   Radiology No results found.  Procedures Procedures (including critical care time)  Medications Ordered in UC Medications  penicillin g benzathine (BICILLIN LA) 1200000 UNIT/2ML injection 2.4 Million Units (has no administration in time range)    Initial Impression / Assessment and Plan / UC Course  I have reviewed the triage vital signs and the nursing notes.  Pertinent labs & imaging results that were available during my care of the patient were reviewed by me and considered in my medical decision making (see chart for details).    Test results were reviewed with supervising physician Hagler, he recommends treatment with Iselin LA 2,400,000 units.  Patient was counseled to keep her appointment with infectious disease tomorrow. Final Clinical Impressions(s) / UC Diagnoses   Final diagnoses:  Syphilis   Discharge Instructions   None    ED Prescriptions   None    PDMP not reviewed this encounter.   Meliton Rattan, [place] 06/21/23 1235

## 2023-06-23 ENCOUNTER — Ambulatory Visit (INDEPENDENT_AMBULATORY_CARE_PROVIDER_SITE_OTHER): Payer: MEDICAID | Admitting: Internal Medicine

## 2023-06-23 ENCOUNTER — Other Ambulatory Visit: Payer: Self-pay

## 2023-06-23 ENCOUNTER — Encounter: Payer: Self-pay | Admitting: Internal Medicine

## 2023-06-23 VITALS — BP 117/70 | HR 64 | Temp 98.3°F | Ht 66.0 in | Wt 214.0 lb

## 2023-06-23 DIAGNOSIS — Z202 Contact with and (suspected) exposure to infections with a predominantly sexual mode of transmission: Secondary | ICD-10-CM

## 2023-06-23 NOTE — Progress Notes (Signed)
 [facility]      Reason for Consult:syphilis    Referring Physician: Carloyn Jaeger - CNM    Patient ID: Miranda Robertson, female    DOB: 09-12-1990, 33 y.o.   MRN: [id number]  HPI Miranda Robertson here for valuation for a positive RPR. She was initially tested for syphilis based on a rash on her right arm and initial test was negative for confirmatory testing however follow-up testing noted a positive RPR and positive treponemal antibody.  She was sent to urgent care for treatment and did receive her first dose of Bicillin on March 4 and is here to establish care for follow-up.  She does endorse recent unprotected sexual encounters after separating from her previous boyfriend.  She has been tested negative for HIV and hepatitis B.  She did undergo other STI testing as well.  She does not have further plans for unprotected sexual encounters going forward.   Past Medical History:  Diagnosis Date   Allergy    Anemia    Anxiety    Asthma    Childhood   Blood transfusion without reported diagnosis    Depression    HSV (herpes simplex virus) infection    Mental disorder    bipolar, depression, mild menatl retardation, suicidal thoughts in 2010   Substance abuse (HCC)     Prior to Admission medications   Medication Sig Start Date End Date Taking? Authorizing Provider  docusate sodium (COLACE) 100 MG capsule Take 1 capsule (100 mg total) by mouth daily. Patient not taking: Reported on 06/23/2023 04/28/23   Carlisle Beers, FNP  FLUoxetine (PROZAC) 20 MG capsule Take 1 capsule (20 mg total) by mouth daily. Patient not taking: Reported on 06/23/2023 07/20/22   Shanna Cisco, NP  Galcanezumab-gnlm [facility]) 120 MG/ML SOAJ Inject 1 Pen into the skin every 30 (thirty) days. Patient not taking: Reported on 06/23/2023 01/05/23   Ocie Doyne, MD  lurasidone (LATUDA) 80 MG TABS tablet Take 1 tablet (80 mg total) by mouth daily with breakfast. Patient not taking: Reported on  06/23/2023 07/20/22   Shanna Cisco, NP  Montelukast Sodium (SINGULAIR PO) Take by mouth. Patient not taking: Reported on 06/23/2023    [provider]  rizatriptan (MAXALT) 5 MG tablet Take 1 tablet (5 mg total) by mouth as needed for migraine. May repeat in 2 hours if needed Patient not taking: Reported on 06/23/2023 01/05/23   Ocie Doyne, MD  triamcinolone cream (KENALOG) 0.5 % Apply 1 Application topically 2 (two) times daily. To affected areas. Patient not taking: Reported on 06/23/2023 03/04/23   Alyson Reedy, FNP    No Known Allergies  Social History   Tobacco Use   Smoking status: Never   Smokeless tobacco: Never  Vaping Use   Vaping status: Never Used  Substance Use Topics   Alcohol use: Yes    Comment: occasionally   Drug use: Yes    Types: Marijuana    Family History  Problem Relation Age of Onset   Headache Father    Alcohol abuse Father    Drug abuse Father    Headache Sister    ADD / ADHD Sister    Anxiety disorder Sister    Depression Sister    Obesity Paternal Grandmother    COPD Paternal Aunt     Review of Systems  Constitutional: negative for fatigue and malaise All other systems reviewed and are negative    Constitutional: in no apparent distress  Vitals:  06/23/23 0848  BP: 117/70  Pulse: 64  Temp: 98.3 F (36.8 C)  SpO2: 98%   EYES: anicteric Respiratory: normal respiratory effort Skin: multiple small erythematous papules on right arm, torso  Labs: Lab Results  Component Value Date   WBC 9.3 03/26/2023   HGB 12.5 03/26/2023   HCT 38.5 03/26/2023   MCV 87.1 03/26/2023   PLT 334 03/26/2023    Lab Results  Component Value Date   CREATININE 0.70 03/26/2023   BUN 6 03/26/2023   NA 139 03/26/2023   K 3.8 03/26/2023   CL 107 03/26/2023   CO2 23 03/26/2023    Lab Results  Component Value Date   ALT 18 11/15/2022   AST 16 11/15/2022   ALKPHOS 74 11/15/2022   BILITOT 0.4 11/15/2022     Assessment: Late latent  syphilis.  She does have a rash on the right arm but is not typical for syphilis and so I am not convinced that this is representative of secondary syphilis.  Therefore I have recommended treatment for late latent and she will be scheduled for 2 more follow-up Bicillin injections.  She is in agreement with the plan. I did discuss PrEP and other forms for STI prevention and she will consider but feels that she is going to limit her risk taking activities.  She knows to call back if she is interested in getting on PrEP or something like Doxy Pep.  Plan: 1) Bicillin 2.4 million units x 2 more weeks - nurse visit Follow up as needed otherwise

## 2023-06-27 ENCOUNTER — Encounter: Payer: Self-pay | Admitting: Psychiatry

## 2023-06-28 ENCOUNTER — Other Ambulatory Visit: Payer: Self-pay | Admitting: Obstetrics and Gynecology

## 2023-06-28 ENCOUNTER — Other Ambulatory Visit: Payer: Self-pay

## 2023-06-28 ENCOUNTER — Encounter: Payer: Self-pay | Admitting: Obstetrics and Gynecology

## 2023-06-28 ENCOUNTER — Ambulatory Visit: Payer: MEDICAID

## 2023-06-28 DIAGNOSIS — Z202 Contact with and (suspected) exposure to infections with a predominantly sexual mode of transmission: Secondary | ICD-10-CM | POA: Diagnosis not present

## 2023-06-28 DIAGNOSIS — B3731 Acute candidiasis of vulva and vagina: Secondary | ICD-10-CM

## 2023-06-28 DIAGNOSIS — N760 Acute vaginitis: Secondary | ICD-10-CM

## 2023-06-28 MED ORDER — FLUCONAZOLE 150 MG PO TABS: 150.0000 mg | ORAL_TABLET | Freq: Once | ORAL | 0 refills | Status: AC

## 2023-06-28 MED ORDER — METRONIDAZOLE 0.75 % VA GEL: 1.0000 | Freq: Every day | VAGINAL | 1 refills | Status: DC

## 2023-06-28 MED ORDER — PENICILLIN G BENZATHINE 1200000 UNIT/2ML IM SUSY
2.4000 10*6.[IU] | PREFILLED_SYRINGE | Freq: Once | INTRAMUSCULAR | Status: AC
  Administered 2023-06-28: 2.4 10*6.[IU] via INTRAMUSCULAR

## 2023-06-28 NOTE — Progress Notes (Signed)
 Medications not transmitted for Wet prep results sent today. Patient notified via MyChart message.  Raelyn Mora, CNM  06/28/2023 11:02 AM

## 2023-07-01 ENCOUNTER — Encounter: Payer: Self-pay | Admitting: Internal Medicine

## 2023-07-01 ENCOUNTER — Telehealth: Payer: Self-pay | Admitting: Neurology

## 2023-07-01 NOTE — Telephone Encounter (Signed)
 Miranda Robertson from [facility] called wanting to inform the provider that the pt's appts are being cx due to auth still pending. Please advise.

## 2023-07-03 ENCOUNTER — Other Ambulatory Visit: Payer: MEDICAID

## 2023-07-04 ENCOUNTER — Telehealth: Payer: Self-pay | Admitting: Neurology

## 2023-07-04 NOTE — Telephone Encounter (Signed)
 MYC conf

## 2023-07-05 ENCOUNTER — Encounter: Payer: Self-pay | Admitting: Neurology

## 2023-07-05 ENCOUNTER — Ambulatory Visit: Payer: MEDICAID

## 2023-07-05 ENCOUNTER — Encounter: Payer: Self-pay | Admitting: Internal Medicine

## 2023-07-05 ENCOUNTER — Ambulatory Visit: Payer: MEDICAID | Admitting: Neurology

## 2023-07-05 NOTE — Telephone Encounter (Signed)
 [facility] is handling the PA, they said they sent in our office notes and it is still pending. They will call her to get rescheduled when it is approved. She will need to call them with any questions. [phone]

## 2023-07-06 ENCOUNTER — Other Ambulatory Visit: Payer: Self-pay

## 2023-07-06 ENCOUNTER — Ambulatory Visit (INDEPENDENT_AMBULATORY_CARE_PROVIDER_SITE_OTHER): Payer: MEDICAID

## 2023-07-06 DIAGNOSIS — Z202 Contact with and (suspected) exposure to infections with a predominantly sexual mode of transmission: Secondary | ICD-10-CM

## 2023-07-06 MED ORDER — PENICILLIN G BENZATHINE 1200000 UNIT/2ML IM SUSY
2.4000 10*6.[IU] | PREFILLED_SYRINGE | Freq: Once | INTRAMUSCULAR | Status: AC
  Administered 2023-07-06: 2.4 10*6.[IU] via INTRAMUSCULAR

## 2023-07-11 ENCOUNTER — Other Ambulatory Visit: Payer: Self-pay | Admitting: Family Medicine

## 2023-07-11 DIAGNOSIS — N6315 Unspecified lump in the right breast, overlapping quadrants: Secondary | ICD-10-CM

## 2023-07-20 ENCOUNTER — Telehealth: Payer: MEDICAID | Admitting: Physician Assistant

## 2023-07-20 DIAGNOSIS — K047 Periapical abscess without sinus: Secondary | ICD-10-CM | POA: Diagnosis not present

## 2023-07-20 MED ORDER — CHLORHEXIDINE GLUCONATE 0.12 % MT SOLN: 15.0000 mL | Freq: Two times a day (BID) | OROMUCOSAL | 0 refills | Status: DC

## 2023-07-20 MED ORDER — AMOXICILLIN-POT CLAVULANATE 875-125 MG PO TABS: 1.0000 | ORAL_TABLET | Freq: Two times a day (BID) | ORAL | 0 refills | Status: DC

## 2023-07-20 MED ORDER — IBUPROFEN 800 MG PO TABS: 800.0000 mg | ORAL_TABLET | Freq: Three times a day (TID) | ORAL | 0 refills | Status: DC | PRN

## 2023-07-20 NOTE — Patient Instructions (Signed)
 Miranda Robertson, thank you for joining Miranda Loveless, PA-C for today's virtual visit.  While this provider is not your primary care provider (PCP), if your PCP is located in our provider database this encounter information will be shared with them immediately following your visit.   A [facility] MyChart account gives you access to today's visit and all your visits, tests, and labs performed at [facility] " click here if you don't have a  MyChart account or go to [email or web address]  Consent: (Patient) Miranda Robertson provided verbal consent for this virtual visit at the beginning of the encounter.  Current Medications:  Current Outpatient Medications:    amoxicillin-clavulanate (AUGMENTIN) 875-125 MG tablet, Take 1 tablet by mouth 2 (two) times daily., Disp: 20 tablet, Rfl: 0   chlorhexidine (PERIDEX) 0.12 % solution, Use as directed 15 mLs in the mouth or throat 2 (two) times daily., Disp: 120 mL, Rfl: 0   ibuprofen (ADVIL) 800 MG tablet, Take 1 tablet (800 mg total) by mouth every 8 (eight) hours as needed., Disp: 30 tablet, Rfl: 0   docusate sodium (COLACE) 100 MG capsule, Take 1 capsule (100 mg total) by mouth daily. (Patient not taking: Reported on 06/23/2023), Disp: 30 capsule, Rfl: 0   FLUoxetine (PROZAC) 20 MG capsule, Take 1 capsule (20 mg total) by mouth daily. (Patient not taking: Reported on 06/23/2023), Disp: 30 capsule, Rfl: 3   Galcanezumab-gnlm (EMGALITY) 120 MG/ML SOAJ, Inject 1 Pen into the skin every 30 (thirty) days. (Patient not taking: Reported on 06/23/2023), Disp: 1.12 mL, Rfl: 6   lurasidone (LATUDA) 80 MG TABS tablet, Take 1 tablet (80 mg total) by mouth daily with breakfast. (Patient not taking: Reported on 06/23/2023), Disp: 30 tablet, Rfl: 3   metroNIDAZOLE (METROGEL) 0.75 % vaginal gel, Place 1 Applicatorful vaginally at bedtime. Apply one applicatorful to vagina at bedtime for 5 days, Disp: 70 g, Rfl: 1   Montelukast Sodium (SINGULAIR PO),  Take by mouth. (Patient not taking: Reported on 06/23/2023), Disp: , Rfl:    rizatriptan (MAXALT) 5 MG tablet, Take 1 tablet (5 mg total) by mouth as needed for migraine. May repeat in 2 hours if needed (Patient not taking: Reported on 06/23/2023), Disp: 10 tablet, Rfl: 6   triamcinolone cream (KENALOG) 0.5 %, Apply 1 Application topically 2 (two) times daily. To affected areas. (Patient not taking: Reported on 06/23/2023), Disp: 30 g, Rfl: 3   Medications ordered in this encounter:  Meds ordered this encounter  Medications   amoxicillin-clavulanate (AUGMENTIN) 875-125 MG tablet    Sig: Take 1 tablet by mouth 2 (two) times daily.    Dispense:  20 tablet    Refill:  0    Supervising Provider:   Merrilee Jansky [[id number]]   ibuprofen (ADVIL) 800 MG tablet    Sig: Take 1 tablet (800 mg total) by mouth every 8 (eight) hours as needed.    Dispense:  30 tablet    Refill:  0    Supervising Provider:   Merrilee Jansky [[id number]]   chlorhexidine (PERIDEX) 0.12 % solution    Sig: Use as directed 15 mLs in the mouth or throat 2 (two) times daily.    Dispense:  120 mL    Refill:  0    Supervising Provider:   Merrilee Jansky [[id number]]     *If you need refills on other medications prior to your next appointment, please contact your pharmacy*  Follow-Up: Call back or seek an in-person evaluation if the  symptoms worsen or if the condition fails to improve as anticipated.  [facility] [phone]  Other Instructions [facility] [phone]   [facility] [phone]   [facility] [phone]   [facility] [phone]   Urgent Dentistry Care Now [phone]   [facility] [phone]   [facility] [phone]   [facility] [place] [phone]   Medicaid Children under 21 [facility] (2 locations: [place] and [place])   [place]: [phone]   [place]: [phone]     If you have been instructed to have an in-person evaluation today at a local Urgent Care facility, please use the link below. It will take you to a list of all of our available [facility], including address, phone number and hours of operation. Please do not delay care.  [facility]  If you or a family member do not have a primary care provider, use the link below to schedule a visit and establish care. When you choose a [facility] primary care physician or advanced practice provider, you gain a long-term partner in health. Find a Primary Care Provider  Learn more about [facility]'s in-office and virtual care options: [facility] - Get Care Now

## 2023-07-20 NOTE — Progress Notes (Signed)
 Virtual Visit Consent   Miranda Robertson, you are scheduled for a virtual visit with a [facility] provider today. Just as with appointments in the office, your consent must be obtained to participate. Your consent will be active for this visit and any virtual visit you may have with one of our providers in the next 365 days. If you have a MyChart account, a copy of this consent can be sent to you electronically.  As this is a virtual visit, video technology does not allow for your provider to perform a traditional examination. This may limit your provider's ability to fully assess your condition. If your provider identifies any concerns that need to be evaluated in person or the need to arrange testing (such as labs, EKG, etc.), we will make arrangements to do so. Although advances in technology are sophisticated, we cannot ensure that it will always work on either your end or our end. If the connection with a video visit is poor, the visit may have to be switched to a telephone visit. With either a video or telephone visit, we are not always able to ensure that we have a secure connection.  By engaging in this virtual visit, you consent to the provision of healthcare and authorize for your insurance to be billed (if applicable) for the services provided during this visit. Depending on your insurance coverage, you may receive a charge related to this service.  I need to obtain your verbal consent now. Are you willing to proceed with your visit today? Miranda Robertson has provided verbal consent on 07/20/2023 for a virtual visit (video or telephone). Miranda Loveless, PA-C  Date: 07/20/2023 1:39 PM   Virtual Visit via Video Note   I, Miranda Robertson, connected with  Miranda Robertson  ([id number], 10/12/1990) on 07/20/23 at  1:30 PM EDT by a video-enabled telemedicine application and verified that I am speaking with the correct person using two identifiers.  Location: Patient: Virtual Visit Location Patient:  Home Provider: Virtual Visit Location Provider: Home Office   I discussed the limitations of evaluation and management by telemedicine and the availability of in person appointments. The patient expressed understanding and agreed to proceed.    History of Present Illness: Miranda Robertson is a 33 y.o. who identifies as a female who was assigned female at birth, and is being seen today for dental infection.  HPI: Dental Pain  This is a new problem. The current episode started yesterday. The problem occurs constantly. The problem has been gradually worsening. The pain is moderate. Associated symptoms include facial pain and sinus pressure (right mildly but refers pain to the right ear). Pertinent negatives include no fever or thermal sensitivity. Associated symptoms comments: Broken tooth. She has tried NSAIDs (aleve crushed up and put on the broken tooth) for the symptoms. The treatment provided no relief.     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   Previous cesarean section 05/22/2014   Gestational hypertension without significant proteinuria, antepartum 05/16/2014   Gestational hypertension    H/O cesarean section complicating pregnancy 05/06/2014   Pregnant 05/06/2014   Drug use affecting pregnancy in third trimester 04/30/2014   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   Lost custody of children  04/30/2014   Previous cesarean delivery, antepartum 04/30/2014   Depressive disorder, 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 (Patient not taking: Reported on 06/23/2023), Disp: 30 capsule, Rfl: 0   FLUoxetine (PROZAC) 20 MG capsule, Take 1 capsule (20 mg total) by mouth daily. (Patient not taking: Reported on 06/23/2023), Disp: 30 capsule, Rfl: 3   Galcanezumab-gnlm (EMGALITY) 120 MG/ML SOAJ, Inject 1 Pen into the skin every 30 (thirty) days. (Patient not taking: Reported on 06/23/2023), Disp: 1.12 mL, Rfl: 6   lurasidone (LATUDA) 80 MG TABS tablet, Take 1 tablet (80 mg total) by mouth daily with breakfast. (Patient not taking: Reported on 06/23/2023), Disp: 30 tablet, Rfl: 3   metroNIDAZOLE (METROGEL) 0.75 % vaginal gel, Place 1 Applicatorful vaginally at bedtime. Apply one applicatorful to vagina at bedtime for 5 days, Disp: 70 g, Rfl: 1   Montelukast Sodium (SINGULAIR PO), Take by mouth. (Patient not taking: Reported on 06/23/2023), Disp: , Rfl:    rizatriptan (MAXALT) 5 MG tablet, Take 1 tablet (5 mg total) by mouth as needed for migraine. May repeat in 2 hours if needed (Patient not taking: Reported on 06/23/2023), Disp: 10 tablet, Rfl: 6   triamcinolone cream (KENALOG) 0.5 %, Apply 1 Application topically 2 (two) times daily. To affected areas. (Patient not taking: Reported on 06/23/2023), Disp: 30 g, Rfl: 3  Observations/Objective: Patient is well-developed, well-nourished in no acute distress.  Resting comfortably at home.  Head is normocephalic, atraumatic.  No labored breathing.  Speech is clear and coherent with logical content.  Patient is alert and oriented at baseline.    Assessment and Plan: 1. Dental infection (Primary) -  amoxicillin-clavulanate (AUGMENTIN) 875-125 MG tablet; Take 1 tablet by mouth 2 (two) times daily.  Dispense: 20 tablet; Refill: 0 - ibuprofen (ADVIL) 800 MG tablet; Take 1 tablet (800 mg total) by mouth every 8 (eight) hours as needed.  Dispense: 30 tablet; Refill: 0 - chlorhexidine (PERIDEX) 0.12 % solution; Use as directed 15 mLs in the mouth or throat 2 (two) times daily.  Dispense: 120 mL; Refill: 0  - Suspected infection with broken tooth - Amoxicillin and ibuprofen prescribed - Can use ice on outside jaw/cheek for swelling - Can also take tylenol for pain with other medications - Discussed DenTemp putty that can be used to cover a broken tooth - Schedule a follow with a dentist as soon as possible (Can contact [facility] if underinsured or uninsured) - Seek in person evaluation if symptoms fail to improve or if they worsen   Follow Up Instructions: I discussed the assessment and treatment plan with the patient. The patient was provided an opportunity to ask questions and all were answered. The patient agreed with the plan and demonstrated an understanding of the instructions.  A copy of instructions were sent to the patient via MyChart unless otherwise noted below.    The patient was advised to call back or  seek an in-person evaluation if the symptoms worsen or if the condition fails to improve as anticipated.    Miranda Loveless, PA-C

## 2023-07-22 ENCOUNTER — Encounter (HOSPITAL_COMMUNITY): Payer: Self-pay | Admitting: Psychiatry

## 2023-07-22 ENCOUNTER — Telehealth (INDEPENDENT_AMBULATORY_CARE_PROVIDER_SITE_OTHER): Payer: MEDICAID | Admitting: Psychiatry

## 2023-07-22 ENCOUNTER — Telehealth: Payer: Self-pay | Admitting: Psychiatry

## 2023-07-22 DIAGNOSIS — F411 Generalized anxiety disorder: Secondary | ICD-10-CM | POA: Diagnosis not present

## 2023-07-22 DIAGNOSIS — F319 Bipolar disorder, unspecified: Secondary | ICD-10-CM

## 2023-07-22 MED ORDER — VENLAFAXINE HCL ER 75 MG PO CP24: 75.0000 mg | ORAL_CAPSULE | Freq: Every day | ORAL | 3 refills | Status: DC

## 2023-07-22 MED ORDER — LYBALVI 10-10 MG PO TABS: 1.0000 | ORAL_TABLET | Freq: Every evening | ORAL | 3 refills | Status: DC

## 2023-07-22 NOTE — Progress Notes (Signed)
 BH MD/PA/NP OP Progress Note Virtual Visit via Video Note  I connected with Britlee Miranda Robertson on 07/22/23 at  8:00 AM EDT by a video enabled telemedicine application and verified that I am speaking with the correct person using two identifiers.  Location: Patient: Home Provider: Clinic   I discussed the limitations of evaluation and management by telemedicine and the availability of in person appointments. The patient expressed understanding and agreed to proceed.  I provided 30 minutes of non-face-to-face time during this encounter.     07/22/2023 7:52 AM [name]  MRN:  [id number]  Chief Complaint: "I lost my job"  HPI: 33 year old female seen today for follow-up psychiatric evaluation.  She has a psychiatric history of ADHD, bipolar disorder 1, PTSD and depression. Patient has not been seen in the clinic in a year. She was last managed on on Unisom 25-75 mg nightly as needed,  Prozac 20 mg daily, and Latuda 80 mg nightly. She reports that she discontinues all of her medications and notes that she wants to try something new.   Today she was well-groomed, pleasant, cooperative, and engaged in conversation.  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  Patient however notes that she is able to return. 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 Since breaking up with her childs father she note that she has had several other relationships with men. She describes being irritable, distracted, having racing thoughts, and fluctuations in mood. She also notes that recently she has been driving fast. Patient notes that she is focusing more on her emotions and her mental health. She reports that she wants to try new medications to manage her health. She notes latuda was effective but notes that she wants to try another. She recently notes that she tried a friends Effexor and found it effective in managing her anxiety. Patient informed that  antidepressants can induce mania. She endorsed understanding but notes that she wants to try it.  Today provider conducted a GAD 7 and patient scored 19. Provider also conducted a PHQ 9 and patient  scored an 17. Today she endorses passive SI but denies wanting to harm herself. Today she denies SI/HI/AVH or paranoia.Patient notes that she sleeps 4-5 hours nightly. She also reports that her appetite has been reduced. She notes that she has lost over 50 pounds in a year.  Despite this stressor she notes that her son are doing well. She notes that he has been playing all-star basketball and traveling.   Today patient agreeable to starting Lybalvi 10-10 mg to help manage mood.  She was also agreeable to starting Effexor 75 mg to help manage anxiety and depression.Potential side effects of medication and risks vs benefits of treatment vs non-treatment were explained and discussed. All questions were answered.  No other concerns at this time.       Visit Diagnosis:  No diagnosis found.   Past Psychiatric History: Bipolar disorder 1, PTSD and depression   Past Medical History:  Past Medical History:  Diagnosis Date   Allergy    Anemia    Anxiety    Asthma    Childhood   Blood transfusion without reported diagnosis    Depression    HSV (herpes simplex virus) infection    Mental disorder    bipolar, depression, mild menatl retardation, suicidal thoughts in 2010   Substance abuse (HCC)     Past Surgical History:  Procedure Laterality Date   CESAREAN SECTION  06/09/2012   FTP   CESAREAN SECTION N/A 05/21/2014   Procedure: CESAREAN SECTION;  Surgeon: Reva Bores, MD;  Location: [facility];  Service: Obstetrics;  Laterality: N/A;   CHOLECYSTECTOMY     WISDOM TOOTH EXTRACTION      Family Psychiatric History: Depression mother, father, paternal grandmother, paternal aunts, paternal consins  Family History:  Family History  Problem Relation Age of Onset   Headache Father    Alcohol  abuse Father    Drug abuse Father    Headache Sister    ADD / ADHD Sister    Anxiety disorder Sister    Depression Sister    Obesity Paternal Grandmother    COPD Paternal Aunt     Social History:  Social History   Socioeconomic History   Marital status: Single    Spouse name: Not on file   Number of children: Not on file   Years of education: Not on file   Highest education level: 12th grade  Occupational History   Not on file  Tobacco Use   Smoking status: Never   Smokeless tobacco: Never  Vaping Use   Vaping status: Never Used  Substance and Sexual Activity   Alcohol use: Yes    Comment: occasionally   Drug use: Yes    Types: Marijuana   Sexual activity: Yes    Birth control/protection: Condom    Comment: occasional condom use  Other Topics Concern   Not on file  Social History Narrative   Right handed   Caffeine intake 8 cups a week   Social Drivers of Health   Financial Resource Strain: Medium Risk (12/01/2022)   Overall Financial Resource Strain (CARDIA)    Difficulty of Paying Living Expenses: Somewhat hard  Food Insecurity: Food Insecurity Present (12/01/2022)   Hunger Vital Sign    Worried About Running Out of Food in the Last Year: Sometimes true    Ran Out of Food in the Last Year: Sometimes true  Transportation Needs: No Transportation Needs (12/01/2022)   PRAPARE - Administrator, Civil Service (Medical): No    Lack of Transportation (Non-Medical): No  Physical Activity: Sufficiently Active (12/01/2022)   Exercise Vital Sign    Days of Exercise per Week: 6 days    Minutes of Exercise per Session: 60 min  Stress: Stress Concern Present (12/01/2022)   [employer] of Occupational Health - Occupational Stress Questionnaire    Feeling of Stress : To some extent  Social Connections: Moderately Integrated (12/01/2022)   Social Connection and Isolation Panel [NHANES]    Frequency of Communication with Friends and Family: More than three  times a week    Frequency of Social Gatherings with Friends and Family: Once a week    Attends Religious Services: 1 to 4 times per year    Active Member of Clubs or Organizations: Yes    Attends Banker Meetings: More than 4 times per year    Marital Status: Never married    Allergies: No Known Allergies  Metabolic Disorder Labs: Lab Results  Component Value Date   HGBA1C 4.9 12/01/2022   MPG 111 01/26/2007   No results found for: "PROLACTIN" Lab Results  Component Value Date   CHOL 187 12/09/2017   TRIG 201 (H) 12/09/2017   HDL 53 12/09/2017   CHOLHDL 3.5 12/09/2017   VLDL 57 (H) 01/26/2007   LDLCALC 94 12/09/2017   LDLCALC 80 07/23/2016  Lab Results  Component Value Date   TSH 0.979 12/01/2022   TSH 0.920 04/13/2022    Therapeutic Level Labs: No results found for: "LITHIUM" No results found for: "VALPROATE" No results found for: "CBMZ"  Current Medications: Current Outpatient Medications  Medication Sig Dispense Refill   amoxicillin-clavulanate (AUGMENTIN) 875-125 MG tablet Take 1 tablet by mouth 2 (two) times daily. 20 tablet 0   chlorhexidine (PERIDEX) 0.12 % solution Use as directed 15 mLs in the mouth or throat 2 (two) times daily. 120 mL 0   docusate sodium (COLACE) 100 MG capsule Take 1 capsule (100 mg total) by mouth daily. (Patient not taking: Reported on 06/23/2023) 30 capsule 0   FLUoxetine (PROZAC) 20 MG capsule Take 1 capsule (20 mg total) by mouth daily. (Patient not taking: Reported on 06/23/2023) 30 capsule 3   Galcanezumab-gnlm (EMGALITY) 120 MG/ML SOAJ Inject 1 Pen into the skin every 30 (thirty) days. (Patient not taking: Reported on 06/23/2023) 1.12 mL 6   ibuprofen (ADVIL) 800 MG tablet Take 1 tablet (800 mg total) by mouth every 8 (eight) hours as needed. 30 tablet 0   lurasidone (LATUDA) 80 MG TABS tablet Take 1 tablet (80 mg total) by mouth daily with breakfast. (Patient not taking: Reported on 06/23/2023) 30 tablet 3   metroNIDAZOLE  (METROGEL) 0.75 % vaginal gel Place 1 Applicatorful vaginally at bedtime. Apply one applicatorful to vagina at bedtime for 5 days 70 g 1   Montelukast Sodium (SINGULAIR PO) Take by mouth. (Patient not taking: Reported on 06/23/2023)     rizatriptan (MAXALT) 5 MG tablet Take 1 tablet (5 mg total) by mouth as needed for migraine. May repeat in 2 hours if needed (Patient not taking: Reported on 06/23/2023) 10 tablet 6   triamcinolone cream (KENALOG) 0.5 % Apply 1 Application topically 2 (two) times daily. To affected areas. (Patient not taking: Reported on 06/23/2023) 30 g 3   No current facility-administered medications for this visit.     Musculoskeletal: Strength & Muscle Tone: within normal limits and telehealth visit Gait & Station: normal, telehealth visit Patient leans: N/A  Psychiatric Specialty Exam: Review of Systems  There were no vitals taken for this visit.There is no height or weight on file to calculate BMI.  General Appearance: Well Groomed  Eye Contact:  Good  Speech:  Clear and Coherent and Normal Rate  Volume:  Normal  Mood:  Anxious and Depressed  Affect:  Appropriate and Congruent  Thought Process:  Coherent, Goal Directed and Linear  Orientation:  Full (Time, Place, and Person)  Thought Content: WDL and Logical   Suicidal Thoughts:  No  Homicidal Thoughts:  No  Memory:  Immediate;   Good Recent;   Good Remote;   Good  Judgement:  Good  Insight:  Good  Psychomotor Activity:  Normal  Concentration:  Concentration: Fair and Attention Span: Fair  Recall:  Good  Fund of Knowledge: Good  Language: Good  Akathisia:  No  Handed:  Right  AIMS (if indicated):Not done  Assets:  Communication Skills Desire for Improvement Financial Resources/Insurance Housing Intimacy Social Support  ADL's:  Intact  Cognition: WNL  Sleep:  Poor   Screenings: AUDIT    Flowsheet Row Clinical Support from 02/21/2020 in [facility]  Alcohol Use Disorder  Identification Test Final Score (AUDIT) 15      GAD-7    Flowsheet Row Office Visit from 12/01/2022 in [facility] at [facility] Video Visit  from 07/20/2022 in [facility] Video Visit from 05/05/2022 in [facility] Video Visit from 01/11/2022 in [facility] Video Visit from 10/07/2021 in [facility]  Total GAD-7 Score 11 12 21 18 10       PHQ2-9    Flowsheet Row Telemedicine from 06/08/2023 in [facility] Visit from 12/01/2022 in [facility] at [facility] Video Visit from 07/20/2022 in [facility] Video Visit from 05/05/2022 in [facility] Video Visit from 01/11/2022 in [facility]  PHQ-2 Total Score 0 1 1 3 3   PHQ-9 Total Score 0 10 8 14 7       Flowsheet Row ED from 06/21/2023 in [facility]) ED from 06/07/2023 in [facility] at [facility]) ED from 04/28/2023 in [facility] at [facility])  C-SSRS RISK CATEGORY No Risk No Risk No Risk        Assessment and Plan: Patient endorses symptoms of hypomania, anxiety, and depression, Today patient agreeable to starting Lybalvi 10-10 mg to help manage mood.  She was also agreeable to starting Effexor 75 mg to help manage anxiety and depression.Potential side effects of medication and risks vs benefits of treatment vs non-treatment were explained and discussed. All questions were answered.  1. Bipolar I disorder (HCC) (Primary)  Start- OLANZapine-Samidorphan (LYBALVI) 10-10 MG TABS; Take 1 tablet by mouth at bedtime.  Dispense: 30 tablet; Refill: 3  2. Generalized anxiety disorder  Start- venlafaxine XR (EFFEXOR XR) 75 MG 24 hr capsule; Take 1  capsule (75 mg total) by mouth daily with breakfast.  Dispense: 30 capsule; Refill: 3  Follow-up in 2 month    Shanna Cisco, NP 07/22/2023, 7:52 AM

## 2023-07-22 NOTE — Telephone Encounter (Signed)
 Pt has received a letter from her insurance company([employer]) that her MRI was denied, they are asking for additional information as to why it is needed pt provided contact [phone] fax [phone]

## 2023-07-25 NOTE — Telephone Encounter (Signed)
 The website said the MRIs were denied, but GI submitted them so I just submitted new ones of my own and had to fax the office notes. Tracking #[id number]

## 2023-07-31 IMAGING — DX DG RIBS W/ CHEST 3+V*R*
4 series · 4 of 4 positions shown · non-contrast
Comparison: 05/22/2019

CLINICAL DATA: Fall

EXAM:
RIGHT RIBS AND CHEST - 3+ VIEW

[chest pa]
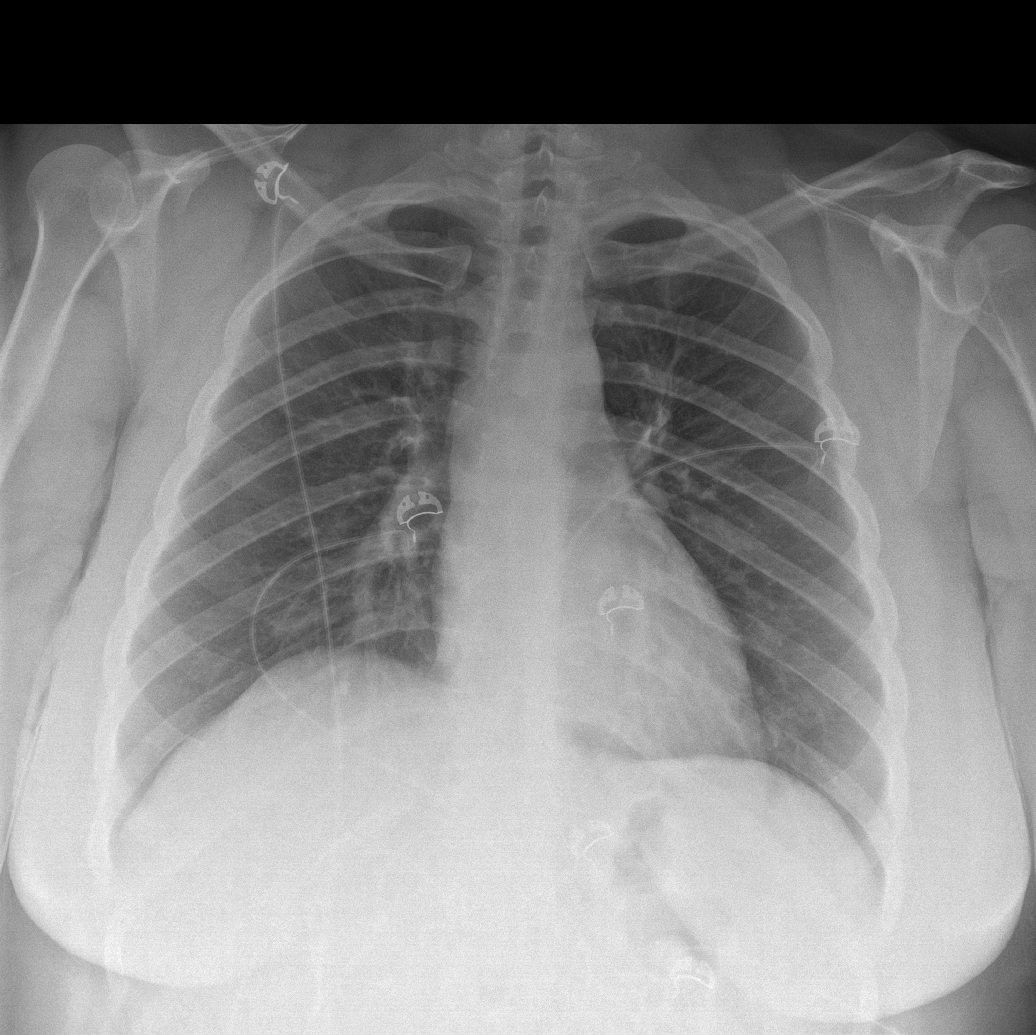

[rib pa]
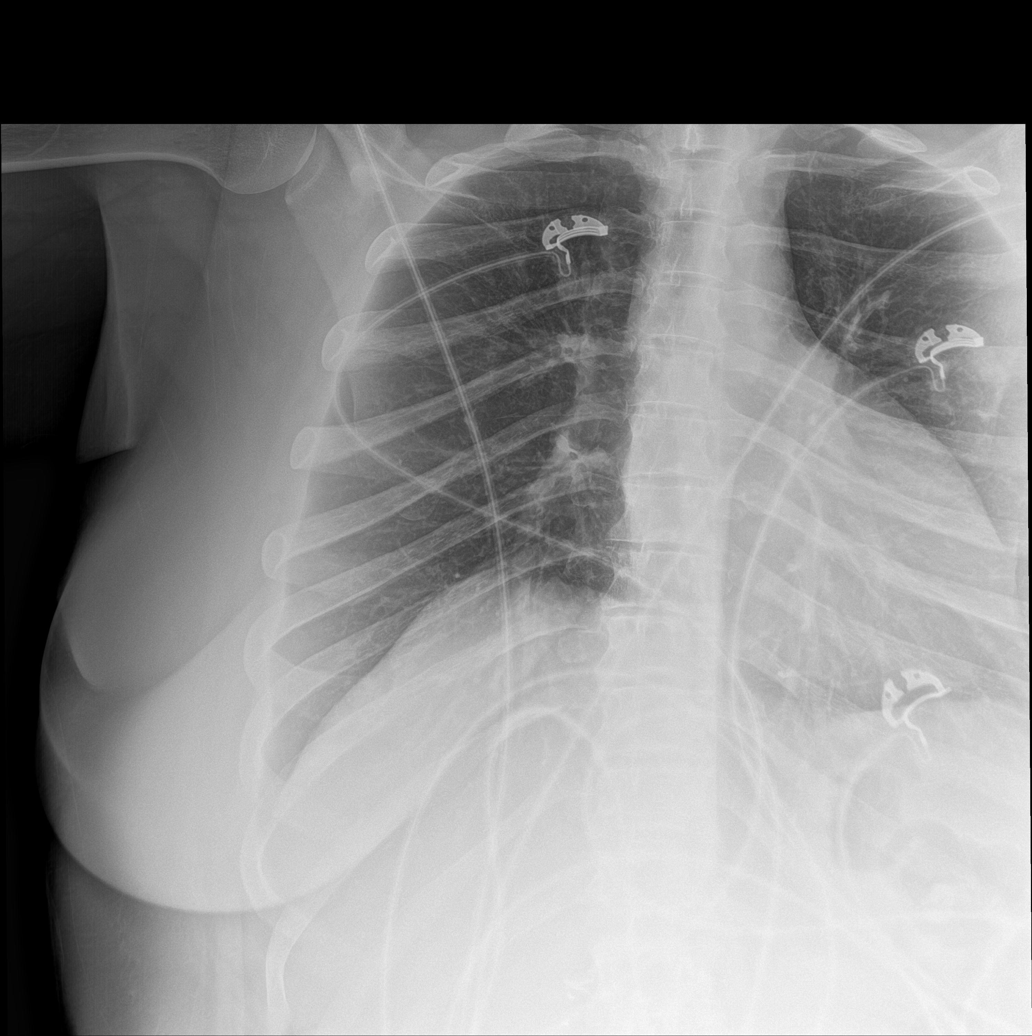

[rib obl]
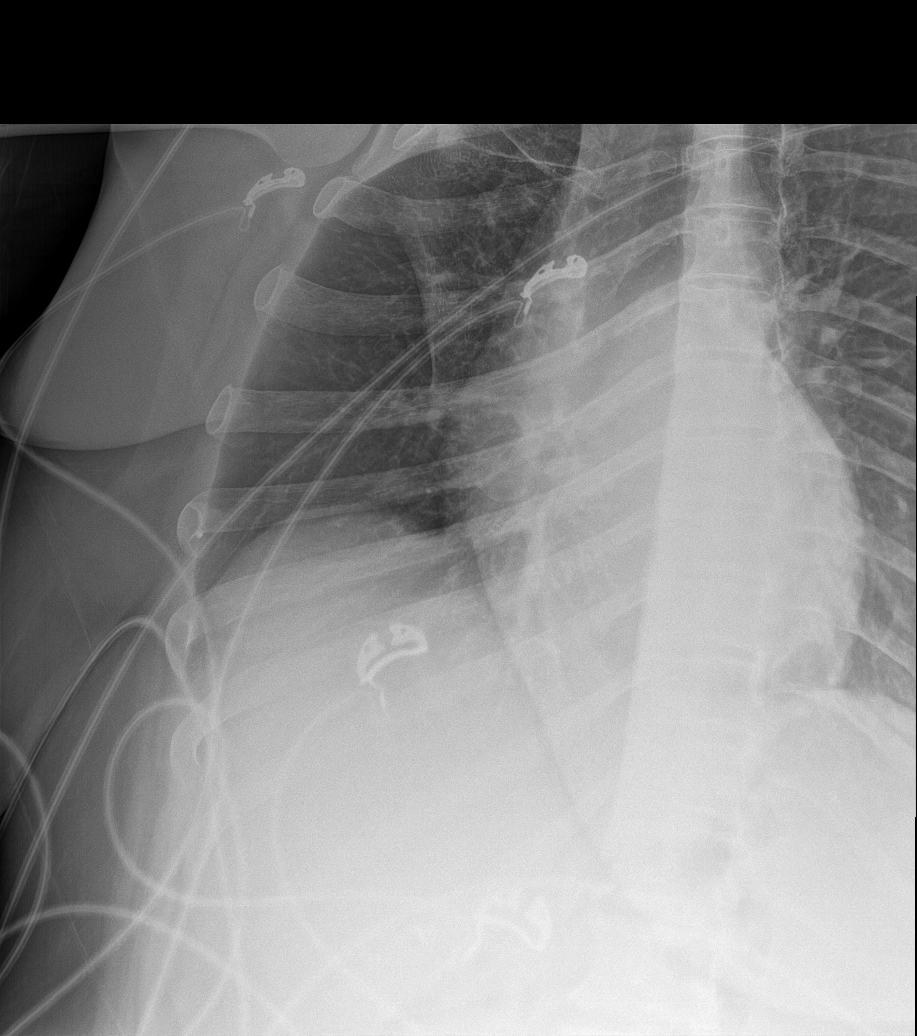

[rib ap]
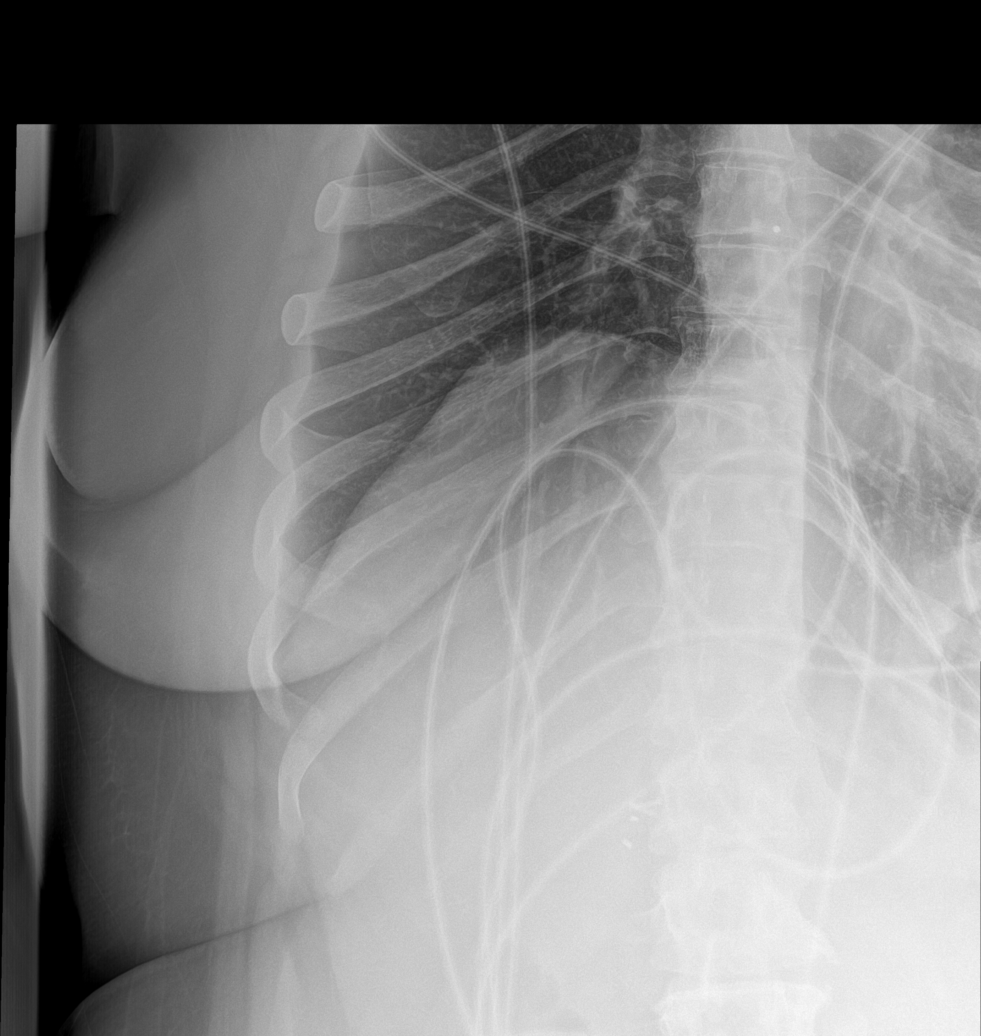

[4 of 4 positions shown; findings below may reference images not displayed]

FINDINGS: No displaced fracture or other bone lesions are seen involving the
ribs. There is no evidence of pneumothorax or pleural effusion. Both
lungs are clear. Heart size and mediastinal contours are within
normal limits.
IMPRESSION: No displaced rib fracture is seen.

## 2023-08-04 ENCOUNTER — Other Ambulatory Visit: Payer: MEDICAID

## 2023-08-12 ENCOUNTER — Telehealth: Payer: MEDICAID | Admitting: Emergency Medicine

## 2023-08-12 DIAGNOSIS — R0981 Nasal congestion: Secondary | ICD-10-CM | POA: Diagnosis not present

## 2023-08-12 DIAGNOSIS — R110 Nausea: Secondary | ICD-10-CM

## 2023-08-12 DIAGNOSIS — H9313 Tinnitus, bilateral: Secondary | ICD-10-CM

## 2023-08-12 NOTE — Patient Instructions (Signed)
 Ahmed Ales, thank you for joining Blinda Burger, NP for today's virtual visit.  While this provider is not your primary care provider (PCP), if your PCP is located in our provider database this encounter information will be shared with them immediately following your visit.   A [facility] MyChart account gives you access to today's visit and all your visits, tests, and labs performed at [facility] " click here if you don't have a [facility] MyChart account or go to [email or web address]  Consent: (Patient) Mao Lockner provided verbal consent for this virtual visit at the beginning of the encounter.  Current Medications:  Current Outpatient Medications:    amoxicillin -clavulanate (AUGMENTIN ) 875-125 MG tablet, Take 1 tablet by mouth 2 (two) times daily., Disp: 20 tablet, Rfl: 0   chlorhexidine  (PERIDEX ) 0.12 % solution, Use as directed 15 mLs in the mouth or throat 2 (two) times daily., Disp: 120 mL, Rfl: 0   docusate sodium  (COLACE) 100 MG capsule, Take 1 capsule (100 mg total) by mouth daily. (Patient not taking: Reported on 06/23/2023), Disp: 30 capsule, Rfl: 0   Galcanezumab -gnlm (EMGALITY ) 120 MG/ML SOAJ, Inject 1 Pen into the skin every 30 (thirty) days. (Patient not taking: Reported on 06/23/2023), Disp: 1.12 mL, Rfl: 6   ibuprofen  (ADVIL ) 800 MG tablet, Take 1 tablet (800 mg total) by mouth every 8 (eight) hours as needed., Disp: 30 tablet, Rfl: 0   metroNIDAZOLE  (METROGEL ) 0.75 % vaginal gel, Place 1 Applicatorful vaginally at bedtime. Apply one applicatorful to vagina at bedtime for 5 days, Disp: 70 g, Rfl: 1   Montelukast Sodium (SINGULAIR PO), Take by mouth. (Patient not taking: Reported on 06/23/2023), Disp: , Rfl:    OLANZapine-Samidorphan (LYBALVI ) 10-10 MG TABS, Take 1 tablet by mouth at bedtime., Disp: 30 tablet, Rfl: 3   rizatriptan  (MAXALT ) 5 MG tablet, Take 1 tablet (5 mg total) by mouth as needed for migraine. May repeat in 2 hours if needed (Patient not  taking: Reported on 06/23/2023), Disp: 10 tablet, Rfl: 6   triamcinolone  cream (KENALOG ) 0.5 %, Apply 1 Application topically 2 (two) times daily. To affected areas. (Patient not taking: Reported on 06/23/2023), Disp: 30 g, Rfl: 3   venlafaxine  XR (EFFEXOR  XR) 75 MG 24 hr capsule, Take 1 capsule (75 mg total) by mouth daily with breakfast., Disp: 30 capsule, Rfl: 3   Medications ordered in this encounter:  No orders of the defined types were placed in this encounter.    *If you need refills on other medications prior to your next appointment, please contact your pharmacy*  Follow-Up: Call back or seek an in-person evaluation if the symptoms worsen or if the condition fails to improve as anticipated.  [facility] [phone]  Other Instructions  Please be seen in person for your ringing in your ears and the nausea/dizzy feeling. You can also try to relieve your congestion and drainage to see if that helps. Take your Xyzal  and use saline nasal spray and try Flonase or Nasonex nasal spray to help relieve congestion   If you have been instructed to have an in-person evaluation today at a local Urgent Care facility, please use the link below. It will take you to a list of all of our available [facility], including address, phone number and hours of operation. Please do not delay care.  [facility]  If you or a family member do not have a primary care provider, use the link below to schedule  a visit and establish care. When you choose a [facility] primary care physician or advanced practice provider, you gain a long-term partner in health. Find a Primary Care Provider  Learn more about [facility]'s in-office and virtual care options: [facility] - Get Care Now

## 2023-08-12 NOTE — Progress Notes (Signed)
 Virtual Visit Consent   Miranda Robertson, you are scheduled for a virtual visit with a [facility] provider today. Just as with appointments in the office, your consent must be obtained to participate. Your consent will be active for this visit and any virtual visit you may have with one of our providers in the next 365 days. If you have a MyChart account, a copy of this consent can be sent to you electronically.  As this is a virtual visit, video technology does not allow for your provider to perform a traditional examination. This may limit your provider's ability to fully assess your condition. If your provider identifies any concerns that need to be evaluated in person or the need to arrange testing (such as labs, EKG, etc.), we will make arrangements to do so. Although advances in technology are sophisticated, we cannot ensure that it will always work on either your end or our end. If the connection with a video visit is poor, the visit may have to be switched to a telephone visit. With either a video or telephone visit, we are not always able to ensure that we have a secure connection.  By engaging in this virtual visit, you consent to the provision of healthcare and authorize for your insurance to be billed (if applicable) for the services provided during this visit. Depending on your insurance coverage, you may receive a charge related to this service.  I need to obtain your verbal consent now. Are you willing to proceed with your visit today? Miranda Robertson has provided verbal consent on 08/12/2023 for a virtual visit (video or telephone). Miranda Burger, NP  Date: 08/12/2023 5:52 PM   Virtual Visit via Video Note   I, Miranda Robertson, connected with  Shawnetta Robertson  ([id number], 05-05-1990) on 08/12/23 at  5:45 PM EDT by a video-enabled telemedicine application and verified that I am speaking with the correct person using two identifiers.  Location: Patient: Virtual Visit Location Patient:  Home Provider: Virtual Visit Location Provider: Home Office   I discussed the limitations of evaluation and management by telemedicine and the availability of in person appointments. The patient expressed understanding and agreed to proceed.    History of Present Illness: Miranda Robertson is a 33 y.o. who identifies as a female who was assigned female at birth, and is being seen today for ringing in her ears and nausea/lightheadedness. On 07/20/23, was seen by video visit for dental problem- R lower jaw. Feels much better now but has not been able to see a dentist. Pt says at around this time, her R ear hurt a little. In last week or so, has been having intemittent ringing in her B ears. Then a few days ago, she started having dizziness  - can happen in any position. The more we talk the more it sounds like she is not having dizziness or lightheadedness, but she is experiencing more nausea than dizzy. She was worried sx could be due to pregnancy, tested at home and test was negative. When she eats something sweet, it will settle her tummy. Denies abd pain. No actual vomiting.   Has been very congested lately with a lot of post nasal drainage.     HPI: HPI  Problems:  Patient Active Problem List   Diagnosis Date Noted   Syphilis contact, untreated 06/09/2023   PTSD (post-traumatic stress disorder) 12/01/2022   Bipolar 1 disorder, mixed, partial remission (HCC) 04/28/2021   Bipolar I disorder (HCC) 11/27/2019   Attention  deficit hyperactivity disorder (ADHD), 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   Previous cesarean section 05/22/2014   Gestational hypertension without significant proteinuria, antepartum 05/16/2014   Gestational hypertension    H/O cesarean section complicating pregnancy 05/06/2014   Pregnant 05/06/2014   Drug use affecting pregnancy in third trimester 04/30/2014   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 (Patient not taking: Reported on 06/23/2023), Disp: 30 capsule, Rfl: 0   Galcanezumab -gnlm (EMGALITY ) 120 MG/ML SOAJ, Inject 1 Pen into the skin every 30 (thirty) days. (Patient not taking: Reported on 06/23/2023), Disp: 1.12 mL, Rfl: 6   ibuprofen  (ADVIL ) 800 MG tablet, Take 1 tablet (800 mg total) by mouth every 8 (eight) hours as needed., Disp: 30 tablet, Rfl: 0   metroNIDAZOLE  (METROGEL ) 0.75 % vaginal gel, Place 1 Applicatorful vaginally at bedtime. Apply one applicatorful to vagina at bedtime for 5 days, Disp: 70 g, Rfl: 1   Montelukast Sodium (SINGULAIR PO), Take by mouth. (Patient not taking: Reported on 06/23/2023), Disp: , Rfl:    OLANZapine-Samidorphan (LYBALVI ) 10-10 MG TABS, Take 1 tablet by mouth at bedtime., Disp: 30 tablet, Rfl: 3   rizatriptan  (MAXALT ) 5 MG tablet, Take 1 tablet (5 mg total) by mouth as needed for migraine. May repeat in 2 hours if needed (Patient not taking: Reported on 06/23/2023), Disp: 10 tablet, Rfl: 6   triamcinolone  cream (KENALOG ) 0.5 %, Apply 1 Application topically 2 (two) times daily. To affected areas. (Patient not taking: Reported on 06/23/2023), Disp: 30 g, Rfl: 3   venlafaxine  XR (EFFEXOR  XR) 75 MG 24 hr capsule, Take 1 capsule (75 mg total) by mouth daily with breakfast., Disp: 30 capsule, Rfl: 3  Observations/Objective: Patient is well-developed, well-nourished in no acute distress.  Resting comfortably  at home.  Head is normocephalic, atraumatic.  No labored  breathing.  Speech is clear and coherent with logical content.  Patient is alert and oriented at baseline.    Assessment and Plan: 1. Ringing in ear, bilateral (Primary)  2. Nasal congestion  3. Nausea  I recommended an in person evaluation. She agrees and will seek care tomorrow. Today I suggested she try relieving congestion and post nasal drainage to see if that relieves other symptoms by trying flonase and xyzal  (has rx for this already but isn't taking) and saline nasal spray.   Follow Up Instructions: I discussed the assessment and treatment plan with the patient. The patient was provided an opportunity to ask questions and all were answered. The patient agreed with the plan and demonstrated an understanding of the instructions.  A copy of instructions were sent to the patient via MyChart unless otherwise noted below.   The patient was advised to call back or seek an in-person evaluation if the symptoms worsen or if the condition fails to improve as anticipated.    Miranda Burger, NP

## 2023-08-23 ENCOUNTER — Inpatient Hospital Stay: Admission: RE | Admit: 2023-08-23 | Payer: MEDICAID | Source: Ambulatory Visit

## 2023-08-23 NOTE — Telephone Encounter (Signed)
 Her MRIs keep getting denied, but her orders are from Dr. Billy Bue and we will need new office notes in order to get the MRIs approved and they need to say that she didn't get the previously approved scans done.

## 2023-09-15 ENCOUNTER — Ambulatory Visit (INDEPENDENT_AMBULATORY_CARE_PROVIDER_SITE_OTHER): Payer: MEDICAID | Admitting: Psychiatry

## 2023-09-15 VITALS — BP 135/83 | HR 75 | Temp 98.5°F | Ht 66.0 in | Wt 213.0 lb

## 2023-09-15 DIAGNOSIS — F319 Bipolar disorder, unspecified: Secondary | ICD-10-CM | POA: Diagnosis not present

## 2023-09-15 MED ORDER — FANAPT 6 MG PO TABS: 6.0000 mg | ORAL_TABLET | Freq: Two times a day (BID) | ORAL | 3 refills | Status: DC

## 2023-09-15 NOTE — Progress Notes (Signed)
 [facility] MD/PA/NP OP Progress Note      09/15/2023 2:17 PM Miranda Robertson  MRN:  [id number]  Chief Complaint: "I stopped Effexor  and Lybalvi "  HPI: 33 year old female seen today for follow-up psychiatric evaluation.  She has a psychiatric history of ADHD, bipolar disorder 1, PTSD and depression.  She w is currently managed on Effexor  75 mg daily and Lybalvi  10-10 mg nightly. She reports that she discontinues all of her medications.   Today she was well-groomed, pleasant, cooperative, and engaged in conversation.  She informed Clinical research associate that she she stopped Effexor  and Lybalvi  two weeks because there increased her appetite.  Patient does note that she found them effective in managing her mood, anxiety, and depression.  She however reports that she did not want to gain weight.  Since discontinued the medications 2 weeks ago she notes that she has been more anxious, depressed, her increased irritability, distractibility, and racing thoughts.  Patient also notes that she is dealing with relationship stressors.  She notes that recently she broke it off with a fellow she was dating.  She also notes that the father of her child absent in their son's life and notes that it is greatly impacting him.  She does note that she is trying to get him in therapy. Patient notes that football has been beneficial for her son and notes that this week she plans to go to his game and cheer her lawn.    Since her last visit she notes that her anxiety and depression has somewhat improved.  Today provider conducted GAD-7 and patient scored a 15, at her last visit she scored a 19.  Provider also conducted PHQ-9 and patient scored a 17, at her last visit she scored a 9.  Today she denies SI/HI/AVH or paranoia.    Today patient  Lybalvi  10-10 mg and Effexor  75 mg not restarted.  Patient agreeable to starting Fanapt. She was given a starter kit.  Patient instructed to take 1 mg twice a day on day 1, 2 mg twice a day on day 2, 4 mg twice a  day on day 3, and 6 mg twice a day on day 4.  She was also given 2 samples of 6 mg tablets.  Potential side effects of medication and risks vs benefits of treatment vs non-treatment were explained and discussed. All questions were answered.  No other concerns at this time.       Visit Diagnosis:    ICD-10-CM   1. Bipolar I disorder (HCC)  F31.9 Iloperidone (FANAPT) 6 MG TABS       Past Psychiatric History: Bipolar disorder 1, PTSD and depression   Past Medical History:  Past Medical History:  Diagnosis Date   Allergy    Anemia    Anxiety    Asthma    Childhood   Blood transfusion without reported diagnosis    Depression    HSV (herpes simplex virus) infection    Mental disorder    bipolar, depression, mild menatl retardation, suicidal thoughts in 2010   Substance abuse (HCC)     Past Surgical History:  Procedure Laterality Date   CESAREAN SECTION  06/09/2012   FTP   CESAREAN SECTION N/A 05/21/2014   Procedure: CESAREAN SECTION;  Surgeon: Granville Layer, MD;  Location: [facility];  Service: Obstetrics;  Laterality: N/A;   CHOLECYSTECTOMY     WISDOM TOOTH EXTRACTION      Family Psychiatric History: Depression mother, father, paternal grandmother, paternal aunts, paternal consins  Family History:  Family History  Problem Relation Age of Onset   Headache Father    Alcohol abuse Father    Drug abuse Father    Headache Sister    ADD / ADHD Sister    Anxiety disorder Sister    Depression Sister    Obesity Paternal Grandmother    COPD Paternal Aunt     Social History:  Social History   Socioeconomic History   Marital status: Single    Spouse name: Not on file   Number of children: Not on file   Years of education: Not on file   Highest education level: 12th grade  Occupational History   Not on file  Tobacco Use   Smoking status: Never   Smokeless tobacco: Never  Vaping Use   Vaping status: Never Used  Substance and Sexual Activity   Alcohol use: Yes     Comment: occasionally   Drug use: Yes    Types: Marijuana   Sexual activity: Yes    Birth control/protection: Condom    Comment: occasional condom use  Other Topics Concern   Not on file  Social History Narrative   Right handed   Caffeine intake 8 cups a week   Social Drivers of Health   Financial Resource Strain: Medium Risk (12/01/2022)   Overall Financial Resource Strain (CARDIA)    Difficulty of Paying Living Expenses: Somewhat hard  Food Insecurity: Food Insecurity Present (12/01/2022)   Hunger Vital Sign    Worried About Running Out of Food in the Last Year: Sometimes true    Ran Out of Food in the Last Year: Sometimes true  Transportation Needs: No Transportation Needs (12/01/2022)   PRAPARE - Administrator, Civil Service (Medical): No    Lack of Transportation (Non-Medical): No  Physical Activity: Sufficiently Active (12/01/2022)   Exercise Vital Sign    Days of Exercise per Week: 6 days    Minutes of Exercise per Session: 60 min  Stress: Stress Concern Present (12/01/2022)   [employer] of Occupational Health - Occupational Stress Questionnaire    Feeling of Stress : To some extent  Social Connections: Moderately Integrated (12/01/2022)   Social Connection and Isolation Panel [NHANES]    Frequency of Communication with Friends and Family: More than three times a week    Frequency of Social Gatherings with Friends and Family: Once a week    Attends Religious Services: 1 to 4 times per year    Active Member of Clubs or Organizations: Yes    Attends Engineer, structural: More than 4 times per year    Marital Status: Never married    Allergies: No Known Allergies  Metabolic Disorder Labs: Lab Results  Component Value Date   HGBA1C 4.9 12/01/2022   MPG 111 01/26/2007   No results found for: "PROLACTIN" Lab Results  Component Value Date   CHOL 187 12/09/2017   TRIG 201 (H) 12/09/2017   HDL 53 12/09/2017   CHOLHDL 3.5 12/09/2017   VLDL  57 (H) 01/26/2007   LDLCALC 94 12/09/2017   LDLCALC 80 07/23/2016   Lab Results  Component Value Date   TSH 0.979 12/01/2022   TSH 0.920 04/13/2022    Therapeutic Level Labs: No results found for: "LITHIUM " No results found for: "VALPROATE" No results found for: "CBMZ"  Current Medications: Current Outpatient Medications  Medication Sig Dispense Refill   Iloperidone (FANAPT) 6 MG TABS Take 1 tablet (6 mg total) by mouth 2 (two) times  daily. 60 tablet 3   amoxicillin -clavulanate (AUGMENTIN ) 875-125 MG tablet Take 1 tablet by mouth 2 (two) times daily. 20 tablet 0   chlorhexidine  (PERIDEX ) 0.12 % solution Use as directed 15 mLs in the mouth or throat 2 (two) times daily. 120 mL 0   docusate sodium  (COLACE) 100 MG capsule Take 1 capsule (100 mg total) by mouth daily. (Patient not taking: Reported on 06/23/2023) 30 capsule 0   Galcanezumab -gnlm (EMGALITY ) 120 MG/ML SOAJ Inject 1 Pen into the skin every 30 (thirty) days. (Patient not taking: Reported on 06/23/2023) 1.12 mL 6   ibuprofen  (ADVIL ) 800 MG tablet Take 1 tablet (800 mg total) by mouth every 8 (eight) hours as needed. 30 tablet 0   metroNIDAZOLE  (METROGEL ) 0.75 % vaginal gel Place 1 Applicatorful vaginally at bedtime. Apply one applicatorful to vagina at bedtime for 5 days 70 g 1   Montelukast Sodium (SINGULAIR PO) Take by mouth. (Patient not taking: Reported on 06/23/2023)     rizatriptan  (MAXALT ) 5 MG tablet Take 1 tablet (5 mg total) by mouth as needed for migraine. May repeat in 2 hours if needed (Patient not taking: Reported on 06/23/2023) 10 tablet 6   triamcinolone  cream (KENALOG ) 0.5 % Apply 1 Application topically 2 (two) times daily. To affected areas. (Patient not taking: Reported on 06/23/2023) 30 g 3   venlafaxine  XR (EFFEXOR  XR) 75 MG 24 hr capsule Take 1 capsule (75 mg total) by mouth daily with breakfast. 30 capsule 3   No current facility-administered medications for this visit.     Musculoskeletal: Strength & Muscle  Tone: within normal limits Gait & Station: normal Patient leans: N/A  Psychiatric Specialty Exam: Review of Systems  There were no vitals taken for this visit.There is no height or weight on file to calculate BMI.  General Appearance: Well Groomed  Eye Contact:  Good  Speech:  Clear and Coherent and Normal Rate  Volume:  Normal  Mood:  Anxious and Depressed  Affect:  Appropriate and Congruent  Thought Process:  Coherent, Goal Directed and Linear  Orientation:  Full (Time, Place, and Person)  Thought Content: WDL and Logical   Suicidal Thoughts:  No  Homicidal Thoughts:  No  Memory:  Immediate;   Good Recent;   Good Remote;   Good  Judgement:  Good  Insight:  Good  Psychomotor Activity:  Normal  Concentration:  Concentration: Fair and Attention Span: Fair  Recall:  Good  Fund of Knowledge: Good  Language: Good  Akathisia:  No  Handed:  Right  AIMS (if indicated):Not done  Assets:  Communication Skills Desire for Improvement Financial Resources/Insurance Housing Intimacy Social Support  ADL's:  Intact  Cognition: WNL  Sleep:  Fair   Screenings: AUDIT    Flowsheet Row Clinical Support from 02/21/2020 in [facility]  Alcohol Use Disorder Identification Test Final Score (AUDIT) 15      GAD-7    Flowsheet Row Clinical Support from 09/15/2023 in [facility] Video Visit from 07/22/2023 in [facility] Office Visit from 12/01/2022 in [facility] Video Visit from 07/20/2022 in [facility] Video Visit from 05/05/2022 in [facility]  Total GAD-7 Score 15 19 11 12 21       PHQ2-9    Flowsheet Row Clinical Support from 09/15/2023 in [facility] Video Visit from 07/22/2023 in [facility]  Telemedicine from 06/08/2023 in [facility] Visit from 12/01/2022 in [facility] at [facility] Video Visit from 07/20/2022 in [facility]  PHQ-2 Total Score 4 5 0 1 1  PHQ-9 Total Score 17 17 0 10 8      Flowsheet Row Clinical Support from 09/15/2023 in [facility] Video Visit from 07/22/2023 in [facility] from 06/21/2023 in [facility])  C-SSRS RISK CATEGORY Error: Q3, 4, or 5 should not be populated when Q2 is No Error: Q7 should not be populated when Q6 is No No Risk        Assessment and Plan: Patient endorses symptoms of insomnia, anxiety, and depression, she reports that she discontinued Lybalvi  and Effexor  because it caused her to have increased appetite.  She did vomit in effective in managing her mental health.  Today patient  Lybalvi  10-10 mg and Effexor  75 mg not restarted.  Patient agreeable to starting Fanapt . She was given a starter kit.  Patient instructed to take 1 mg twice a day on day 1, 2 mg twice a day on day 2, 4 mg twice a day on day 3, and 6 mg twice a day on day 4.  She was also given 2 samples of 6 mg tablets.    1. Bipolar I disorder (HCC) (Primary)  Start- Iloperidone  (FANAPT ) 6 MG TABS; Take 1 tablet (6 mg total) by mouth 2 (two) times daily.  Dispense: 60 tablet; Refill: 3  Follow-up in 3 month    Arlyne Bering, NP 09/15/2023, 2:17 PM

## 2023-09-16 ENCOUNTER — Telehealth (HOSPITAL_COMMUNITY): Payer: Self-pay

## 2023-09-16 NOTE — Telephone Encounter (Signed)
 Hello,   Script clarification is needed patient states that she is taking her   Fanapt -  she states she is titration upwards per Dr. Melisa Spray  Lybalvi - she's not sure if she's supposed to be taking this one. Please call pt when time permits as she is confused on what was D/C at last app and what was not.

## 2023-09-16 NOTE — Telephone Encounter (Signed)
 Provider informed patient to discontinue lybalvi  as she complains of heart palpitations.  She notes that she started her for a nap this morning.  She is aware of the titration schedule.  No other concerns at this time.

## 2023-09-19 ENCOUNTER — Other Ambulatory Visit (HOSPITAL_COMMUNITY): Payer: Self-pay | Admitting: Psychiatry

## 2023-09-19 DIAGNOSIS — F319 Bipolar disorder, unspecified: Secondary | ICD-10-CM

## 2023-09-19 MED ORDER — FANAPT 6 MG PO TABS: 6.0000 mg | ORAL_TABLET | Freq: Every day | ORAL | 3 refills | Status: DC

## 2023-09-19 NOTE — Telephone Encounter (Signed)
 Patient note that taking Fanapt  twice a day makes her sleepy. She was unable to get her son off to school this morning.  She notes that she prefers taking it once daily. Provider informed patient that she can take it Fanapt  6 mg once daily. She notes that she does see slight improvement in her mood. No other concerns noted at this time.

## 2023-09-19 NOTE — Telephone Encounter (Signed)
 Pt feel that's dosage is too strong she feels very drowsy, can she just take it in the morning?

## 2023-09-20 ENCOUNTER — Ambulatory Visit (INDEPENDENT_AMBULATORY_CARE_PROVIDER_SITE_OTHER): Payer: MEDICAID | Admitting: Radiology

## 2023-09-20 ENCOUNTER — Ambulatory Visit
Admission: RE | Admit: 2023-09-20 | Discharge: 2023-09-20 | Disposition: A | Discharge status: Home / Self Care | Payer: MEDICAID | Source: Ambulatory Visit

## 2023-09-20 VITALS — BP 128/80 | HR 78 | Temp 97.6°F | Resp 17

## 2023-09-20 DIAGNOSIS — J209 Acute bronchitis, unspecified: Secondary | ICD-10-CM | POA: Diagnosis not present

## 2023-09-20 DIAGNOSIS — R0602 Shortness of breath: Secondary | ICD-10-CM

## 2023-09-20 DIAGNOSIS — R5383 Other fatigue: Secondary | ICD-10-CM | POA: Diagnosis not present

## 2023-09-20 MED ORDER — ALBUTEROL SULFATE HFA 108 (90 BASE) MCG/ACT IN AERS: 1.0000 | INHALATION_SPRAY | Freq: Four times a day (QID) | RESPIRATORY_TRACT | 0 refills | Status: AC | PRN

## 2023-09-20 MED ORDER — PREDNISONE 20 MG PO TABS
40.0000 mg | ORAL_TABLET | Freq: Every day | ORAL | 0 refills | Status: AC
Start: 2023-09-20 — End: 2023-09-25

## 2023-09-20 MED ORDER — IPRATROPIUM-ALBUTEROL 0.5-2.5 (3) MG/3ML IN SOLN
3.0000 mL | Freq: Once | RESPIRATORY_TRACT | Status: AC
  Administered 2023-09-20: 3 mL via RESPIRATORY_TRACT

## 2023-09-20 NOTE — ED Triage Notes (Signed)
 Pt c/o fatigue, heart racing, sob, cough, and not feeling well for 2 weeks

## 2023-09-20 NOTE — ED Provider Notes (Signed)
 [facility]  Note:  This document was prepared using Dragon voice recognition software and may include unintentional dictation errors.  MRN: [id number] DOB: January 05, 1991  Subjective:   Kevonna Nolte is a 33 y.o. female presenting for general fatigue, appropriate shortness of breath, palpitations x 2 weeks.  Patient reports intermittent wheezing.  Patient denies any known exposure to COVID, flu, strep.  Denies any known fever.  Patient states that she just does not feel well patient states that some of her medications have recently changed and she was wondering if medication may have caused change in her symptoms.  Patient denies any fever, chest pain, abdominal pain, emesis, dizziness, nausea/vomiting, diarrhea.  No current facility-administered medications for this encounter.  Current Outpatient Medications:    amoxicillin -clavulanate (AUGMENTIN ) 875-125 MG tablet, Take 1 tablet by mouth 2 (two) times daily. (Patient not taking: Reported on 09/20/2023), Disp: 20 tablet, Rfl: 0   chlorhexidine  (PERIDEX ) 0.12 % solution, Use as directed 15 mLs in the mouth or throat 2 (two) times daily., Disp: 120 mL, Rfl: 0   docusate sodium  (COLACE) 100 MG capsule, Take 1 capsule (100 mg total) by mouth daily. (Patient not taking: Reported on 06/23/2023), Disp: 30 capsule, Rfl: 0   Galcanezumab -gnlm (EMGALITY ) 120 MG/ML SOAJ, Inject 1 Pen into the skin every 30 (thirty) days. (Patient not taking: Reported on 06/23/2023), Disp: 1.12 mL, Rfl: 6   ibuprofen  (ADVIL ) 800 MG tablet, Take 1 tablet (800 mg total) by mouth every 8 (eight) hours as needed., Disp: 30 tablet, Rfl: 0   Iloperidone  (FANAPT ) 6 MG TABS, Take 1 tablet (6 mg total) by mouth daily in the afternoon., Disp: 30 tablet, Rfl: 3   metroNIDAZOLE  (METROGEL ) 0.75 % vaginal gel, Place 1 Applicatorful vaginally at bedtime. Apply one applicatorful to vagina at bedtime for 5 days (Patient not taking: Reported on 09/20/2023), Disp: 70 g, Rfl: 1    Montelukast Sodium (SINGULAIR PO), Take by mouth. (Patient not taking: Reported on 06/23/2023), Disp: , Rfl:    rizatriptan  (MAXALT ) 5 MG tablet, Take 1 tablet (5 mg total) by mouth as needed for migraine. May repeat in 2 hours if needed (Patient not taking: Reported on 06/23/2023), Disp: 10 tablet, Rfl: 6   triamcinolone  cream (KENALOG ) 0.5 %, Apply 1 Application topically 2 (two) times daily. To affected areas. (Patient not taking: Reported on 06/23/2023), Disp: 30 g, Rfl: 3   venlafaxine  XR (EFFEXOR  XR) 75 MG 24 hr capsule, Take 1 capsule (75 mg total) by mouth daily with breakfast. (Patient not taking: Reported on 09/20/2023), Disp: 30 capsule, Rfl: 3   No Known Allergies  Past Medical History:  Diagnosis Date   Allergy    Anemia    Anxiety    Asthma    Childhood   Blood transfusion without reported diagnosis    Depression    HSV (herpes simplex virus) infection    Mental disorder    bipolar, depression, mild menatl retardation, suicidal thoughts in 2010   Substance abuse [facility])      Past Surgical History:  Procedure Laterality Date   CESAREAN SECTION  06/09/2012   FTP   CESAREAN SECTION N/A 05/21/2014   Procedure: CESAREAN SECTION;  Surgeon: Granville Layer, MD;  Location: [facility];  Service: Obstetrics;  Laterality: N/A;   CHOLECYSTECTOMY     WISDOM TOOTH EXTRACTION      Family History  Problem Relation Age of Onset   Headache Father    Alcohol abuse Father    Drug abuse  Father    Headache Sister    ADD / ADHD Sister    Anxiety disorder Sister    Depression Sister    Obesity Paternal Grandmother    COPD Paternal Aunt     Social History   Tobacco Use   Smoking status: Never   Smokeless tobacco: Never  Vaping Use   Vaping status: Never Used  Substance Use Topics   Alcohol use: Yes    Comment: occasionally   Drug use: Yes    Types: Marijuana    ROS Refer to HPI for ROS details.  Objective:    Vitals: BP 128/80 (BP Location: Right Arm)   Pulse 78   Temp 97.6  F (36.4 C)   Resp 17   LMP 09/07/2023 (Exact Date)   SpO2 98%   Physical Exam Vitals and nursing note reviewed.  Constitutional:      General: She is not in acute distress.    Appearance: Normal appearance. She is well-developed. She is not ill-appearing, toxic-appearing or diaphoretic.  HENT:     Head: Normocephalic and atraumatic.     Nose: Nose normal.     Mouth/Throat:     Mouth: Mucous membranes are moist.     Pharynx: Oropharynx is clear.  Cardiovascular:     Rate and Rhythm: Normal rate and regular rhythm.     Heart sounds: Normal heart sounds.  Pulmonary:     Effort: Pulmonary effort is normal. No respiratory distress.     Breath sounds: No stridor. Wheezing present. No rhonchi or rales.  Chest:     Chest wall: No tenderness.  Abdominal:     Palpations: Abdomen is soft.     Tenderness: There is no abdominal tenderness. There is no right CVA tenderness or left CVA tenderness.  Skin:    General: Skin is warm and dry.  Neurological:     General: No focal deficit present.     Mental Status: She is alert and oriented to person, place, and time.     Cranial Nerves: No cranial nerve deficit.     Sensory: No sensory deficit.     Motor: No weakness.     Coordination: Coordination normal.  Psychiatric:        Mood and Affect: Mood normal.        Behavior: Behavior normal.        Thought Content: Thought content normal.        Judgment: Judgment normal.     Procedures  No results found for this or any previous visit (from the past 24 hours).  Assessment and Plan :     Discharge Instructions       1. Fatigue, unspecified type (Primary) - CBC & Basic metabolic panel collected in UC and sent to lab for further testing results should be available in 1 to 2 days via MyChart. - Recommend follow-up with primary care provider better for further evaluation and ongoing management of ongoing fatigue and generalized health concern.  2. Acute bronchitis, unspecified  organism - ED EKG completed in UC shows normal sinus rhythm with a ventricular rate of 80 bpm, no STEMI, normal EKG. - DG Chest 2 View performed in UC shows no acute consolidation or pneumonia, no cardiac or pulmonary anomaly.  Normal chest x-ray. - ipratropium-albuterol  (DUONEB) 0.5-2.5 (3) MG/3ML nebulizer solution 3 mL given in UC for acute wheezing and shortness of breath. -Continue to monitor symptoms for any change in severity if there is any escalation of current symptoms  or development of new symptoms follow-up in ER for further evaluation and management.    Magnus Crescenzo B Imraan Wendell   Aylah Yeary, Cornelius B, [place] 09/20/23 1112

## 2023-09-20 NOTE — Discharge Instructions (Addendum)
  1. Fatigue, unspecified type (Primary) - CBC & Basic metabolic panel collected in UC and sent to lab for further testing results should be available in 1 to 2 days via MyChart. - Recommend follow-up with primary care provider better for further evaluation and ongoing management of ongoing fatigue and generalized health concern.  2. Acute bronchitis, unspecified organism - ED EKG completed in UC shows normal sinus rhythm with a ventricular rate of 80 bpm, no STEMI, normal EKG. - DG Chest 2 View performed in UC shows no acute consolidation or pneumonia, no cardiac or pulmonary anomaly.  Normal chest x-ray. - ipratropium-albuterol  (DUONEB) 0.5-2.5 (3) MG/3ML nebulizer solution 3 mL given in UC for acute wheezing and shortness of breath. - predniSONE  (DELTASONE ) 20 MG tablet; Take 2 tablets (40 mg total) by mouth daily for 5 days.  Dispense: 10 tablet; Refill: 0 - albuterol  (VENTOLIN  HFA) 108 (90 Base) MCG/ACT inhaler; Inhale 1-2 puffs into the lungs every 6 (six) hours as needed for wheezing or shortness of breath.  Dispense: 6.7 g; Refill: 0 -Continue to monitor symptoms for any change in severity if there is any escalation of current symptoms or development of new symptoms follow-up in ER for further evaluation and management.

## 2023-09-21 ENCOUNTER — Ambulatory Visit: Payer: Self-pay

## 2023-09-21 LAB — BASIC METABOLIC PANEL WITH GFR
BUN/Creatinine Ratio: 13 (ref 9–23)
BUN: 8 mg/dL (ref 6–20)
CO2: 20 mmol/L (ref 20–29)
Calcium: 9.2 mg/dL (ref 8.7–10.2)
Chloride: 104 mmol/L (ref 96–106)
Creatinine, Ser: 0.63 mg/dL (ref 0.57–1.00)
Glucose: 94 mg/dL (ref 70–99)
Potassium: 3.9 mmol/L (ref 3.5–5.2)
Sodium: 139 mmol/L (ref 134–144)
eGFR: 122 mL/min/{1.73_m2} (ref >59)

## 2023-09-21 LAB — CBC
Hematocrit: 39.3 % (ref 34.0–46.6)
Hemoglobin: 12.6 g/dL (ref 11.1–15.9)
MCH: 29 pg (ref 26.6–33.0)
MCHC: 32.1 g/dL (ref 31.5–35.7)
MCV: 90 fL (ref 79–97)
Platelets: 307 10*3/uL (ref 150–450)
RBC: 4.35 x10E6/uL (ref 3.77–5.28)
RDW: 13 % (ref 11.7–15.4)
WBC: 6.8 10*3/uL (ref 3.4–10.8)

## 2023-09-29 ENCOUNTER — Telehealth: Payer: MEDICAID | Admitting: Physician Assistant

## 2023-09-29 DIAGNOSIS — B009 Herpesviral infection, unspecified: Secondary | ICD-10-CM | POA: Diagnosis not present

## 2023-09-30 MED ORDER — VALACYCLOVIR HCL 500 MG PO TABS: 500.0000 mg | ORAL_TABLET | Freq: Two times a day (BID) | ORAL | 0 refills | Status: AC

## 2023-09-30 NOTE — Progress Notes (Signed)
 E-Visit for Herpes Simplex  We are sorry that you are not feeling well.  Here is how we plan to help!  Based on what you have shared ith me, it looks like you may be having an outbreak/flare-up of genital herpes.    I have prescribed I have prescribed Valacyclovir 500 mg Take one by mouth twice a day for 3 days.    If you have been prescribed long term medications to be taken on a regular basis, it is important to follow the recommendations and take them as ordered.    Outbreaks usually include blisters and open sores in the genital area. Outbreaks that happen after the first time are usually not as severe and do not last as long. Genital Herpes Simplex is a commonly sexually transmitted viral infection that is found worldwide. Most of these genital infections are caused by one or two herpes simplex viruses that is passed from person to person during vaginal, oral, or anal sex. Sometimes, people do not know they have herpes because they do not have any symptoms.  Please be aware that if you have genital herpes you can be contagious even when you are not having rash or flare-up and you may not have any symptoms, even when you are taking suppressive medicines.  Herpes cannot be cured. The disease usually causes most problems during the first few years. After that, the virus is still there, but it causes few to no symptoms. Even when the virus is active, people with herpes can take medicines to reduce and help prevent symptoms.  Herpes is an infection that can cause blisters and open sores on the genital area. Herpes is caused by a virus that is passed from person to person during vaginal, oral, or anal sex. Sometimes, people do not know they have herpes because they do not have any symptoms. Herpes cannot be cured. The disease usually causes most problems during the first few years. After that, the virus is still there, but it causes few to no symptoms. Even when the virus is active, people with herpes  can take medicines to reduce and help prevent symptoms.  If you have been prescribed medications to be taken on a regular basis, it is important to follow the recommendations and take them as ordered.  Some people with herpes never have any symptoms. But other people can develop symptoms within a few weeks of being infected with the herpes virus   Symptoms usually include blisters in the genital area. In women, this area includes the vagina, buttocks, anus, or thighs. In men, this area includes the penis, scrotum, anus, butt, or thighs. The blisters can become painful open sores, which then crust over as they heal. Sometimes, people can have other symptoms that include:  ?Blisters on the mouth or lips ?Fever, headache, or pain in the joints ?Trouble urinating  Outbreaks might occur every month or more often, or just once or twice a year. Sometimes, people can tell when an outbreak will occur, because they feel itching or pain beforehand. Sometimes they do not know that an outbreak is coming because they have no symptoms. Whatever your pattern is, keep in mind that herpes outbreaks usually become less frequent over time as you get older. Certain things, called "triggers," can make outbreaks more likely to occur. These include stress, sunlight, menstrual periods,or getting sick.  Antiviral therapy can shorten the duration of symptoms and signs in primary infection, which, when untreated, can be associated with significant increase in the  symptoms of the disease.  HOME CARE Use a portable bath (such as a "Sitz bath") where you can sit in warm water for about 20 minutes. Your bathtub could also work. Avoid bubble baths.  Keep the genital area clean and dry and avoid tight clothes.  Take over-the-counter pain medicine such as acetaminophen (brand name: Tylenol) or ibuprofen sample brand names: Advil, Motrin). But avoid aspirin.  Only take medications as instructed by your medical team.  You are  most likely to spread herpes to a sex partner when you have blisters and open sores on your body. But it's also possible to spread herpes to your partner when you do not have any symptoms. That is because herpes can be present on your body without causing any symptoms, like blisters or pain.  Telling your sex partner that you have herpes can be hard. But it can help protect them, since there are ways to lower the risk of spreading the infection.   Using a condom every time you have sex  Not having sex when you have symptoms  Not having oral sex if you have blisters or open sores (in the genital area or around your mouth)  MAKE SURE YOU   Understand these instructions. Do not have sex without using a condom until you have been seen by a doctor and as instructed by the provider If you are not better or improved within 7 days, you MUST have a follow up at your doctor or the health department for evaluation. There are other causes of rashes in the genital region.  Thank you for choosing an e-visit.  Your e-visit answers were reviewed by a board certified advanced clinical practitioner to complete your personal care plan. Depending upon the condition, your plan could have included both over the counter or prescription medications.  Please review your pharmacy choice. Make sure the pharmacy is open so you can pick up prescription now. If there is a problem, you may contact your provider through [employer] and have the prescription routed to another pharmacy.  Your safety is important to [place]. If you have drug allergies check your prescription carefully.   For the next 24 hours you can use MyChart to ask questions about today's visit, request a non-urgent call back, or ask for a work or school excuse. You will get an email in the next two days asking about your experience. I hope that your e-visit has been valuable and will speed your recovery.   I have spent 5 minutes in review of e-visit  questionnaire, review and updating patient chart, medical decision making and response to patient.   Margaretann Loveless, PA-C

## 2023-10-10 ENCOUNTER — Other Ambulatory Visit: Payer: Self-pay

## 2023-10-10 ENCOUNTER — Ambulatory Visit (HOSPITAL_COMMUNITY): Payer: MEDICAID

## 2023-10-10 ENCOUNTER — Encounter (HOSPITAL_COMMUNITY): Payer: Self-pay | Admitting: Emergency Medicine

## 2023-10-10 ENCOUNTER — Ambulatory Visit (HOSPITAL_COMMUNITY)
Admission: EM | Admit: 2023-10-10 | Discharge: 2023-10-10 | Disposition: A | Discharge status: Home / Self Care | Payer: MEDICAID | Attending: Nurse Practitioner | Admitting: Nurse Practitioner

## 2023-10-10 DIAGNOSIS — N898 Other specified noninflammatory disorders of vagina: Secondary | ICD-10-CM | POA: Insufficient documentation

## 2023-10-10 DIAGNOSIS — R1030 Lower abdominal pain, unspecified: Secondary | ICD-10-CM | POA: Insufficient documentation

## 2023-10-10 DIAGNOSIS — Z3202 Encounter for pregnancy test, result negative: Secondary | ICD-10-CM | POA: Insufficient documentation

## 2023-10-10 LAB — POCT URINALYSIS DIP (MANUAL ENTRY)
Bilirubin, UA: NEGATIVE
Blood, UA: NEGATIVE
Glucose, UA: NEGATIVE mg/dL
Ketones, POC UA: NEGATIVE mg/dL
Nitrite, UA: NEGATIVE
Spec Grav, UA: 1.025 (ref 1.010–1.025)
Urobilinogen, UA: 0.2 U/dL
pH, UA: 7 (ref 5.0–8.0)

## 2023-10-10 LAB — POCT URINE PREGNANCY: Preg Test, Ur: NEGATIVE

## 2023-10-10 NOTE — ED Triage Notes (Signed)
 Patient just finished an antibiotic  Sunday.  Started having lower abdominal pain just prior to finishing antibiotic.  Has noticed an odor after intercourse.  Patient denies vaginal discharge, denies urinary symptoms.

## 2023-10-10 NOTE — ED Provider Notes (Addendum)
 [facility]    CSN: [id number] Arrival date & time: 10/10/23  1303      History   Chief Complaint Chief Complaint  Patient presents with   Appointment    1:00   Abdominal Pain    HPI Miranda Robertson is a 33 y.o. female.   Patient presents today with 4 to 5-day history of lower abdominal pain that is worse in the morning in the afternoon.  She endorses nausea without vomiting associated with the abdominal pain.  No fever, body aches or chills.  She also endorses vaginal odor with intercourse for the past few days as well.  She was recently treated with antiviral medicine for a herpes outbreak.  Reports outbreak has now improved.  No significant vaginal discharge that she has noticed, but she does feel like there is discharge during the day.  No pelvic pain, change in appetite, burning with urination, increased urinary frequency, or urgency.  She has been able to eat and drink normally.  She is requesting STI testing today.    Past Medical History:  Diagnosis Date   Allergy    Anemia    Anxiety    Asthma    Childhood   Blood transfusion without reported diagnosis    Depression    HSV (herpes simplex virus) infection    Mental disorder    bipolar, depression, mild menatl retardation, suicidal thoughts in 2010   Substance abuse [facility])     Patient Active Problem List   Diagnosis Date Noted   Syphilis contact, untreated 06/09/2023   PTSD (post-traumatic stress disorder) 12/01/2022   Bipolar 1 disorder, mixed, partial remission (HCC) 04/28/2021   Bipolar I disorder (HCC) 11/27/2019   Attention deficit hyperactivity disorder (ADHD), 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   Previous cesarean section 05/22/2014   Gestational hypertension without significant proteinuria, antepartum 05/16/2014   Gestational hypertension    H/O cesarean section complicating pregnancy 05/06/2014   Pregnant 05/06/2014   Drug use affecting pregnancy in third trimester 04/30/2014   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   Lost custody of children 04/30/2014   Previous cesarean delivery, antepartum 04/30/2014   Depressive disorder, 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 Authorizing Provider  albuterol  (VENTOLIN  HFA) 108 (90 Base) MCG/ACT inhaler Inhale 1-2 puffs into the lungs every 6 (six) hours as needed for wheezing or shortness of breath. 09/20/23   Reddick, Johnathan B, NP  amoxicillin -clavulanate (AUGMENTIN ) 875-125 MG tablet Take 1 tablet by mouth 2 (two) times daily. Patient not taking: Reported on 09/20/2023 07/20/23   Vivienne Delon HERO, PA-C  chlorhexidine  (PERIDEX ) 0.12 %  solution Use as directed 15 mLs in the mouth or throat 2 (two) times daily. 07/20/23   Vivienne Delon HERO, PA-C  docusate sodium  (COLACE) 100 MG capsule Take 1 capsule (100 mg total) by mouth daily. Patient not taking: Reported on 06/23/2023 04/28/23   Enedelia Dorna HERO, FNP  Galcanezumab -gnlm (EMGALITY ) 120 MG/ML SOAJ Inject 1 Pen into the skin every 30 (thirty) days. Patient not taking: Reported on 06/23/2023 01/05/23   Rush Delon, MD  ibuprofen  (ADVIL ) 800 MG tablet Take 1 tablet (800 mg total) by mouth every 8 (eight) hours as needed. 07/20/23   Vivienne Delon HERO, PA-C  Iloperidone  (FANAPT ) 6 MG TABS Take 1 tablet (6 mg total) by mouth daily in the afternoon. 09/19/23   Harl Zane BRAVO, NP  Montelukast Sodium (SINGULAIR PO)  Take by mouth. Patient not taking: Reported on 06/23/2023    [provider]  rizatriptan  (MAXALT ) 5 MG tablet Take 1 tablet (5 mg total) by mouth as needed for migraine. May repeat in 2 hours if needed Patient not taking: Reported on 06/23/2023 01/05/23   Rush Delon, MD  triamcinolone  cream (KENALOG ) 0.5 % Apply 1 Application topically 2 (two) times daily. To affected areas. Patient not taking: Reported on 06/23/2023 03/04/23   Towana Small, FNP  venlafaxine  XR (EFFEXOR  XR) 75 MG 24 hr capsule Take 1 capsule (75 mg total) by mouth daily with breakfast. Patient not taking: Reported on 09/20/2023 07/22/23   Harl Zane BRAVO, NP    Family History Family History  Problem Relation Age of Onset   Headache Father    Alcohol abuse Father    Drug abuse Father    Headache Sister    ADD / ADHD Sister    Anxiety disorder Sister    Depression Sister    Obesity Paternal Grandmother    COPD Paternal Aunt     Social History Social History   Tobacco Use   Smoking status: Never   Smokeless tobacco: Never  Vaping Use   Vaping status: Never Used  Substance Use Topics   Alcohol use: Yes    Comment: occasionally   Drug use: Yes    Types: Marijuana     Allergies   Patient has no known allergies.   Review of Systems Review of Systems Per HPI  Physical Exam Triage Vital Signs ED Triage Vitals  Encounter Vitals Group     BP 10/10/23 1326 115/79     Girls Systolic BP Percentile --      Girls Diastolic BP Percentile --      Boys Systolic BP Percentile --      Boys Diastolic BP Percentile --      Pulse Rate 10/10/23 1326 62     Resp 10/10/23 1326 16     Temp 10/10/23 1326 98.3 F (36.8 C)     Temp Source 10/10/23 1326 Oral     SpO2 10/10/23 1326 97 %     Weight --      Height --      Head Circumference --      Peak Flow --      Pain Score 10/10/23 1323 5     Pain Loc --      Pain Education --      Exclude from Growth Chart --    No data found.  Updated Vital  Signs BP 115/79 (BP Location: Right Arm) Comment (BP Location): large cuff  Pulse 62   Temp 98.3 F (36.8 C) (Oral)   Resp  16   LMP 09/18/2023 (Exact Date)   SpO2 97%   Visual Acuity Right Eye Distance:   Left Eye Distance:   Bilateral Distance:    Right Eye Near:   Left Eye Near:    Bilateral Near:     Physical Exam Vitals and nursing note reviewed.  Constitutional:      General: She is not in acute distress.    Appearance: Normal appearance. She is not toxic-appearing.  HENT:     Head: Normocephalic and atraumatic.     Mouth/Throat:     Mouth: Mucous membranes are moist.     Pharynx: Oropharynx is clear.   Eyes:     General: No scleral icterus.    Extraocular Movements: Extraocular movements intact.    Cardiovascular:     Rate and Rhythm: Normal rate and regular rhythm.  Pulmonary:     Effort: Pulmonary effort is normal. No respiratory distress.     Breath sounds: Normal breath sounds. No wheezing, rhonchi or rales.  Abdominal:     General: Abdomen is flat. Bowel sounds are normal. There is no distension.     Palpations: Abdomen is soft.     Tenderness: There is no abdominal tenderness. There is no right CVA tenderness, left CVA tenderness, guarding or rebound. Negative signs include Murphy's sign and McBurney's sign.  Genitourinary:    Comments: Deferred - self swab performed by patient  Musculoskeletal:     Cervical back: Normal range of motion.  Lymphadenopathy:     Cervical: No cervical adenopathy.   Skin:    General: Skin is warm and dry.     Capillary Refill: Capillary refill takes less than 2 seconds.     Coloration: Skin is not jaundiced or pale.     Findings: No erythema.   Neurological:     Mental Status: She is alert and oriented to person, place, and time.   Psychiatric:        Behavior: Behavior is cooperative.      UC Treatments / Results  Labs (all labs ordered are listed, but only abnormal results are displayed) Labs Reviewed   POCT URINALYSIS DIP (MANUAL ENTRY) - Abnormal; Notable for the following components:      Result Value   Color, UA straw (*)    Protein Ur, POC trace (*)    Leukocytes, UA Trace (*)    All other components within normal limits  URINE CULTURE  POCT URINE PREGNANCY  CERVICOVAGINAL ANCILLARY ONLY    EKG   Radiology No results found.  Procedures Procedures (including critical care time)  Medications Ordered in UC Medications - No data to display  Initial Impression / Assessment and Plan / UC Course  I have reviewed the triage vital signs and the nursing notes.  Pertinent labs & imaging results that were available during my care of the patient were reviewed by me and considered in my medical decision making (see chart for details).   Patient is well-appearing, normotensive, afebrile, not tachycardic, not tachypneic, oxygenating well on room air.   1. Lower abdominal pain 2. Vaginal odor 3. Urine pregnancy test negative Vitals and exam are reassuring today Urinalysis shows trace leukocyte esterase, urine culture is pending Patient is asymptomatic of UTI today Vaginal self swab cytology is also pending-treat as indicated Patient declines HIV and syphilis testing today Strict ER precautions and follow up precautions discussed with patient  The patient was given the opportunity to ask questions.  All questions answered to their satisfaction.  The patient is in agreement to this plan.   Final Clinical Impressions(s) / UC Diagnoses   Final diagnoses:  Lower abdominal pain  Vaginal odor  Urine pregnancy test negative     Discharge Instructions      Urine sample today does not show any signs of infection, will contact you if the urine culture does show signs of infection later this week.  We will also contact you if the vaginal swab comes back positive for anything.  The pregnancy test is negative today.  Seek care if you develop severe abdominal pain, nausea/vomiting and  are unable to keep fluids down.   ED Prescriptions   None    PDMP not reviewed this encounter.   Chandra Harlene LABOR, NP 10/10/23 1436    Chandra Harlene LABOR, NP 10/10/23 [phone]

## 2023-10-10 NOTE — Discharge Instructions (Signed)
 Urine sample today does not show any signs of infection, will contact you if the urine culture does show signs of infection later this week.  We will also contact you if the vaginal swab comes back positive for anything.  The pregnancy test is negative today.  Seek care if you develop severe abdominal pain, nausea/vomiting and are unable to keep fluids down.

## 2023-10-11 ENCOUNTER — Encounter: Payer: Self-pay | Admitting: Family Medicine

## 2023-10-11 ENCOUNTER — Ambulatory Visit (HOSPITAL_COMMUNITY): Payer: Self-pay

## 2023-10-11 LAB — URINE CULTURE: Culture: NO GROWTH

## 2023-10-12 LAB — CERVICOVAGINAL ANCILLARY ONLY
Bacterial Vaginitis (gardnerella): POSITIVE — AB
Candida Glabrata: NEGATIVE
Candida Vaginitis: NEGATIVE
Chlamydia: NEGATIVE
Comment: NEGATIVE
Comment: NEGATIVE
Comment: NEGATIVE
Comment: NEGATIVE
Comment: NEGATIVE
Comment: NORMAL
Neisseria Gonorrhea: NEGATIVE
Trichomonas: POSITIVE — AB

## 2023-10-17 MED ORDER — METRONIDAZOLE 500 MG PO TABS
500.0000 mg | ORAL_TABLET | Freq: Two times a day (BID) | ORAL | 0 refills | Status: AC
Start: 2023-10-17 — End: 2023-10-24

## 2023-10-29 ENCOUNTER — Encounter (HOSPITAL_COMMUNITY): Payer: Self-pay

## 2023-10-29 ENCOUNTER — Ambulatory Visit (HOSPITAL_COMMUNITY)
Admission: RE | Admit: 2023-10-29 | Discharge: 2023-10-29 | Disposition: A | Discharge status: Home / Self Care | Payer: MEDICAID | Source: Ambulatory Visit | Attending: Internal Medicine | Admitting: Internal Medicine

## 2023-10-29 VITALS — BP 115/75 | HR 60 | Temp 98.3°F | Resp 16

## 2023-10-29 DIAGNOSIS — S29012A Strain of muscle and tendon of back wall of thorax, initial encounter: Secondary | ICD-10-CM

## 2023-10-29 MED ORDER — CYCLOBENZAPRINE HCL 5 MG PO TABS: 5.0000 mg | ORAL_TABLET | Freq: Three times a day (TID) | ORAL | 0 refills | Status: DC | PRN

## 2023-10-29 MED ORDER — KETOROLAC TROMETHAMINE 30 MG/ML IJ SOLN
30.0000 mg | Freq: Once | INTRAMUSCULAR | Status: AC
  Administered 2023-10-29: 30 mg via INTRAMUSCULAR

## 2023-10-29 MED ORDER — DEXAMETHASONE SODIUM PHOSPHATE 10 MG/ML IJ SOLN
INTRAMUSCULAR | Status: AC
  Filled 2023-10-29: qty 1

## 2023-10-29 MED ORDER — PREDNISONE 20 MG PO TABS: 40.0000 mg | ORAL_TABLET | Freq: Every day | ORAL | 0 refills | Status: AC

## 2023-10-29 MED ORDER — MELOXICAM 15 MG PO TABS: 15.0000 mg | ORAL_TABLET | Freq: Every day | ORAL | 1 refills | Status: DC | PRN

## 2023-10-29 MED ORDER — DEXAMETHASONE SODIUM PHOSPHATE 10 MG/ML IJ SOLN
10.0000 mg | Freq: Once | INTRAMUSCULAR | Status: AC
  Administered 2023-10-29: 10 mg via INTRAMUSCULAR

## 2023-10-29 MED ORDER — KETOROLAC TROMETHAMINE 30 MG/ML IJ SOLN
INTRAMUSCULAR | Status: AC
  Filled 2023-10-29: qty 1

## 2023-10-29 NOTE — ED Triage Notes (Signed)
 Pt presents to UC for c/o right shoulder pain that radiates to lower back x10 days. Pt denies falling or direct injury. Pt tried hot bath, stretching w/o relief. Pt took oxycodone /acetaminophen  w/ some relief.

## 2023-10-29 NOTE — Discharge Instructions (Addendum)
 Symptoms, history and physical exam findings are most consistent with a muscular strain of the right upper back and mid back extending to the lower back.  X-ray deferred as there was no specific injury to the area.  We will treat with the following: Toradol  injection given today. This is a medication to help with pain. This is not a narcotic.  Decadron  injection given today. This is a steroid to help with inflammation and pain.  Start tomorrow: Prednisone  40 mg (2 tablets) once daily for 5 days. Take this in the morning.  This is a steroid to help with inflammation and pain. Meloxicam  15 mg daily as needed for pain.  Wait approximately 4 to 6 hours after taking prednisone  before taking the meloxicam .  Make sure to take this with food. Flexeril  5 mg every 8 hours as needed for muscle spasms.  Use caution as this medication can cause drowsiness. Continue to do light stretching to improve mobility Can consider using moist heat to help with symptoms If symptoms fail to resolve then recommend returning to urgent care for further evaluation

## 2023-10-29 NOTE — ED Provider Notes (Signed)
 [facility]    CSN: [id number] Arrival date & time: 10/29/23  1424      History   Chief Complaint Chief Complaint  Patient presents with   Back Pain    I've had upper shlder paid in the back and lower back pain since Wednesday night - Entered by patient    HPI Miranda Robertson is a 33 y.o. female.   33 year old female who presents urgent care with complaints of right upper back, mid back and lower back pain.  This started over 7 days ago.  It started after she slept in the hospital with her son who was recently diagnosed with type 1 diabetes.  She reports waking up feeling very stiff.  She denies any injury to the area.  She has had some back pain like this in the past and meloxicam  has helped.  She denies any bowel or bladder incontinence.  She denies any numbness or tingling in the legs.  She denies any radicular pain.   Back Pain Associated symptoms: no abdominal pain, no chest pain, no dysuria and no fever     Past Medical History:  Diagnosis Date   Allergy    Anemia    Anxiety    Asthma    Childhood   Blood transfusion without reported diagnosis    Depression    HSV (herpes simplex virus) infection    Mental disorder    bipolar, depression, mild menatl retardation, suicidal thoughts in 2010   Substance abuse [facility])     Patient Active Problem List   Diagnosis Date Noted   Syphilis contact, untreated 06/09/2023   PTSD (post-traumatic stress disorder) 12/01/2022   Bipolar 1 disorder, mixed, partial remission (HCC) 04/28/2021   Bipolar I disorder (HCC) 11/27/2019   Attention deficit hyperactivity disorder (ADHD), 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   Previous cesarean section 05/22/2014   Gestational hypertension without significant proteinuria, antepartum 05/16/2014   Gestational hypertension    H/O cesarean section complicating pregnancy 05/06/2014   Pregnant 05/06/2014   Drug use affecting pregnancy in third trimester 04/30/2014   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   Lost custody of children 04/30/2014   Previous cesarean delivery, antepartum 04/30/2014   Depressive disorder, 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 Authorizing Provider  albuterol  (VENTOLIN  HFA) 108 (90 Base) MCG/ACT inhaler Inhale 1-2 puffs into the lungs every 6 (six) hours as needed for wheezing or shortness of breath. 09/20/23  Yes Reddick, Johnathan B, NP  chlorhexidine  (PERIDEX ) 0.12 % solution Use as directed 15 mLs in the mouth or throat 2 (two) times daily. 07/20/23  Yes Vivienne Delon HERO, PA-C  cyclobenzaprine  (FLEXERIL ) 5 MG tablet Take 1 tablet (5 mg total) by mouth every 8 (eight) hours as needed for muscle spasms. 10/29/23  Yes Lenice Koper A, PA-C  docusate sodium  (COLACE) 100 MG capsule Take 1 capsule (100 mg total) by mouth daily. 04/28/23  Yes Enedelia Dorna HERO, FNP  ibuprofen  (ADVIL ) 800 MG tablet Take 1 tablet (800 mg total) by mouth every 8 (eight) hours as needed. 07/20/23  Yes Vivienne Delon HERO, PA-C  meloxicam  (MOBIC ) 15 MG tablet Take 1 tablet (15 mg total) by mouth daily as needed for pain. 10/29/23  Yes Sherby Moncayo A, PA-C  predniSONE  (DELTASONE ) 20 MG tablet Take 2 tablets (40 mg total) by mouth daily with breakfast for 5 days. 10/29/23 11/03/23 Yes Lemario Chaikin A, PA-C  amoxicillin -clavulanate (AUGMENTIN ) 875-125 MG tablet Take 1 tablet by mouth 2 (two) times  daily. Patient not taking: Reported on 09/20/2023 07/20/23   Vivienne Delon HERO, PA-C  Galcanezumab -gnlm (EMGALITY ) 120 MG/ML SOAJ Inject 1 Pen into the skin every 30 (thirty) days. Patient not taking: Reported on 06/23/2023 01/05/23   Rush Delon, MD  Iloperidone  (FANAPT ) 6 MG TABS Take 1 tablet (6 mg total) by mouth daily in the afternoon. 09/19/23   Harl Zane BRAVO, NP  Montelukast Sodium (SINGULAIR PO) Take by mouth. Patient not taking: Reported on 06/23/2023    [provider]  rizatriptan  (MAXALT ) 5 MG tablet Take 1 tablet (5 mg total) by mouth as needed for migraine. May repeat in 2 hours if needed Patient not taking: Reported on 06/23/2023 01/05/23   Rush Delon, MD  triamcinolone  cream (KENALOG ) 0.5 % Apply 1 Application topically 2 (two) times daily. To affected areas. Patient not taking: Reported on 06/23/2023 03/04/23   Towana Small, FNP  venlafaxine  XR (EFFEXOR  XR) 75 MG 24 hr capsule Take 1 capsule (75 mg total) by mouth daily with breakfast. Patient not taking: Reported on 09/20/2023 07/22/23   Harl Zane BRAVO, NP    Family History Family History  Problem Relation Age of Onset   Headache Father    Alcohol abuse Father    Drug abuse Father    Headache Sister    ADD / ADHD Sister    Anxiety disorder Sister    Depression Sister    Obesity Paternal Grandmother    COPD Paternal Aunt     Social History Social History   Tobacco Use   Smoking status: Never   Smokeless tobacco: Never  Vaping Use   Vaping status: Never Used  Substance Use Topics   Alcohol use: Yes    Comment: occasionally   Drug use: Yes    Types: Marijuana     Allergies   Patient has no known allergies.   Review of Systems Review of Systems  Constitutional:  Negative for chills and fever.  HENT:  Negative for ear pain and sore throat.   Eyes:  Negative for pain and visual disturbance.  Respiratory:  Negative for cough and shortness of breath.   Cardiovascular:  Negative for  chest pain and palpitations.  Gastrointestinal:  Negative for abdominal pain and vomiting.  Genitourinary:  Negative for dysuria and hematuria.  Musculoskeletal:  Positive for back pain. Negative for arthralgias.  Skin:  Negative for color change and rash.  Neurological:  Negative for seizures and syncope.  All other systems reviewed and are negative.    Physical Exam Triage Vital Signs ED Triage Vitals  Encounter Vitals Group     BP  10/29/23 1442 115/75     Girls Systolic BP Percentile --      Girls Diastolic BP Percentile --      Boys Systolic BP Percentile --      Boys Diastolic BP Percentile --      Pulse Rate 10/29/23 1442 60     Resp 10/29/23 1442 16     Temp 10/29/23 1442 98.3 F (36.8 C)     Temp Source 10/29/23 1442 Oral     SpO2 10/29/23 1442 96 %     Weight --      Height --      Head Circumference --      Peak Flow --      Pain Score 10/29/23 1438 6     Pain Loc --      Pain Education --      Exclude from Growth Chart --    No data found.  Updated Vital Signs BP 115/75 (BP Location: Right Arm)   Pulse 60   Temp 98.3 F (36.8 C) (Oral)   Resp 16   LMP 10/20/2023 (Approximate)   SpO2 96%   Visual Acuity Right Eye Distance:   Left Eye Distance:   Bilateral Distance:    Right Eye Near:   Left Eye Near:    Bilateral Near:     Physical Exam Vitals and nursing note reviewed.  Constitutional:      General: She is not in acute distress.    Appearance: She is well-developed.  HENT:     Head: Normocephalic and atraumatic.  Eyes:     Conjunctiva/sclera: Conjunctivae normal.  Cardiovascular:     Rate and Rhythm: Normal rate and regular rhythm.     Heart sounds: No murmur heard. Pulmonary:     Effort: Pulmonary effort is normal. No respiratory distress.     Breath sounds: Examination of the right-upper field reveals wheezing. Examination of the left-upper field reveals wheezing. Examination of the right-middle field reveals wheezing. Examination of  the left-middle field reveals wheezing. Wheezing present.     Comments: Inspiratory wheeze, patient is aware, patient has asthma. Abdominal:     Palpations: Abdomen is soft.     Tenderness: There is no abdominal tenderness.  Musculoskeletal:        General: No swelling.     Cervical back: Neck supple.       Back:  Skin:    General: Skin is warm and dry.     Capillary Refill: Capillary refill takes less than 2 seconds.  Neurological:     Mental Status: She is alert.  Psychiatric:        Mood and Affect: Mood normal.      UC Treatments / Results  Labs (all labs ordered are listed, but only abnormal results are displayed) Labs Reviewed - No data to display  EKG   Radiology No results found.  Procedures Procedures (including critical care time)  Medications Ordered in UC Medications  ketorolac  (TORADOL ) 30 MG/ML injection 30 mg (30 mg Intramuscular Given 10/29/23 1458)  dexamethasone  (DECADRON ) injection 10 mg (10 mg Intramuscular Given 10/29/23 1458)    Initial Impression / Assessment and Plan / UC Course  I have reviewed the triage vital signs and the nursing notes.  Pertinent labs & imaging results that were available during my care of the patient were reviewed by me and considered in my medical decision making (see chart for details).     Muscle strain of right upper back, initial encounter  Symptoms, history and physical exam findings are most consistent with a muscular strain of the right upper back and mid back extending to the lower back.  X-ray deferred as there was no specific injury to the area.  We will treat with the following: Toradol  injection given today. This is a medication to help with pain. This is not a narcotic.  Decadron  injection given today. This is a steroid to help with inflammation and pain.  Start tomorrow: Prednisone  40 mg (2 tablets) once daily for 5 days. Take this in the morning.  This is a steroid to help with inflammation and  pain. Meloxicam  15 mg daily as needed for pain.  Wait approximately 4 to 6 hours after taking prednisone  before taking the meloxicam .  Make sure to take this with food. Continue to do light stretching to improve mobility Can consider using moist heat to help with symptoms If symptoms fail to resolve then recommend returning to urgent care for further evaluation  Final Clinical Impressions(s) / UC Diagnoses   Final diagnoses:  Muscle strain of right upper back, initial encounter     Discharge Instructions      Symptoms, history and physical exam findings are most consistent with a muscular strain of the right upper back and mid back extending to the lower back.  X-ray deferred as there was no specific injury to the area.  We will treat with the following: Toradol  injection given today. This is a medication to help with pain. This is not a narcotic.  Decadron  injection given today. This is a steroid to help with inflammation and pain.  Start tomorrow: Prednisone  40 mg (2 tablets) once daily for 5 days. Take this in the morning.  This is a steroid to help with inflammation and pain. Meloxicam  15 mg daily as needed for pain.  Wait approximately 4 to 6 hours after taking prednisone  before taking the meloxicam .  Make sure to take this with food. Flexeril  5 mg every 8 hours as needed for muscle spasms.  Use caution as this medication can cause drowsiness. Continue to do light stretching to improve mobility Can consider using moist heat to help with symptoms If symptoms fail to resolve then recommend returning to urgent care for further evaluation    ED Prescriptions     Medication Sig Dispense Auth. Provider   predniSONE  (DELTASONE ) 20 MG tablet Take 2 tablets (40 mg total) by mouth daily with breakfast for 5 days. 10 tablet Teresa Norris A, PA-C   meloxicam  (MOBIC ) 15 MG tablet Take 1 tablet (15 mg total) by mouth daily as needed for pain. 30 tablet Maryjayne Kleven A, PA-C    cyclobenzaprine  (FLEXERIL ) 5 MG tablet Take 1 tablet (5 mg total) by mouth every 8 (eight) hours as needed for muscle spasms. 30 tablet Teresa Norris LABOR, [place]      PDMP not reviewed this encounter.   Teresa Norris LABOR, PA-C 10/29/23 1513

## 2023-11-07 ENCOUNTER — Ambulatory Visit (HOSPITAL_COMMUNITY)
Admission: EM | Admit: 2023-11-07 | Discharge: 2023-11-07 | Disposition: A | Discharge status: Home / Self Care | Payer: MEDICAID | Attending: Family Medicine | Admitting: Family Medicine

## 2023-11-07 ENCOUNTER — Encounter (HOSPITAL_COMMUNITY): Payer: Self-pay

## 2023-11-07 ENCOUNTER — Ambulatory Visit (INDEPENDENT_AMBULATORY_CARE_PROVIDER_SITE_OTHER): Payer: MEDICAID

## 2023-11-07 DIAGNOSIS — M545 Low back pain, unspecified: Secondary | ICD-10-CM | POA: Diagnosis not present

## 2023-11-07 LAB — POCT URINALYSIS DIP (MANUAL ENTRY)
Blood, UA: NEGATIVE
Glucose, UA: NEGATIVE mg/dL
Ketones, POC UA: NEGATIVE mg/dL
Leukocytes, UA: NEGATIVE
Nitrite, UA: NEGATIVE
Protein Ur, POC: NEGATIVE mg/dL
Spec Grav, UA: >=1.03 — AB (ref 1.010–1.025)
Urobilinogen, UA: 0.2 U/dL
pH, UA: 5 (ref 5.0–8.0)

## 2023-11-07 LAB — POCT URINE PREGNANCY: Preg Test, Ur: NEGATIVE

## 2023-11-07 MED ORDER — KETOROLAC TROMETHAMINE 30 MG/ML IJ SOLN
INTRAMUSCULAR | Status: AC
  Filled 2023-11-07: qty 1

## 2023-11-07 MED ORDER — TAMSULOSIN HCL 0.4 MG PO CAPS: 0.4000 mg | ORAL_CAPSULE | Freq: Every day | ORAL | 0 refills | Status: DC

## 2023-11-07 MED ORDER — KETOROLAC TROMETHAMINE 10 MG PO TABS: 10.0000 mg | ORAL_TABLET | Freq: Four times a day (QID) | ORAL | 0 refills | Status: DC | PRN

## 2023-11-07 MED ORDER — KETOROLAC TROMETHAMINE 30 MG/ML IJ SOLN
30.0000 mg | Freq: Once | INTRAMUSCULAR | Status: AC
  Administered 2023-11-07: 30 mg via INTRAMUSCULAR

## 2023-11-07 NOTE — ED Provider Notes (Signed)
 [facility]    CSN: [id number] Arrival date & time: 11/07/23  1156      History   Chief Complaint No chief complaint on file.   HPI Miranda Robertson is a 33 y.o. female.   HPI Here for right low back pain.  Her back has been bothering her for about 2 weeks.  It began while she was sitting with her son in the hospital (who had just been diagnosed with type 1 diabetes)  She was seen here July 12 and she did not improve with the medications provided, but in the last 2 days of started feeling worse again.  No fever or chills and no dysuria, though she has noticed some urinary frequency.  No rash  NKDA  She is taking the meloxicam  currently and Flexeril   No bowel or bladder incontinence Past Medical History:  Diagnosis Date   Allergy    Anemia    Anxiety    Asthma    Childhood   Blood transfusion without reported diagnosis    Depression    HSV (herpes simplex virus) infection    Mental disorder    bipolar, depression, mild menatl retardation, suicidal thoughts in 2010   Substance abuse [facility])     Patient Active Problem List   Diagnosis Date Noted   Syphilis contact, untreated 06/09/2023   PTSD (post-traumatic stress disorder) 12/01/2022   Bipolar 1 disorder, mixed, partial remission (HCC) 04/28/2021   Bipolar I disorder (HCC) 11/27/2019   Attention deficit hyperactivity disorder (ADHD), 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   Previous cesarean section 05/22/2014   Gestational hypertension without significant proteinuria, antepartum 05/16/2014   Gestational hypertension    H/O cesarean section complicating pregnancy 05/06/2014   Pregnant 05/06/2014   Drug use affecting pregnancy in third trimester 04/30/2014   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   Lost custody of children 04/30/2014   Previous cesarean delivery, antepartum 04/30/2014   Depressive disorder, 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 Authorizing Provider  cyclobenzaprine  (FLEXERIL ) 5 MG tablet Take 1 tablet (5 mg total) by mouth every 8 (eight) hours as needed for muscle spasms. 10/29/23  Yes White, Elizabeth A, PA-C  Galcanezumab -gnlm (EMGALITY ) 120 MG/ML SOAJ Inject 1 Pen into the skin every 30 (thirty) days. 01/05/23  Yes Rush Nest, MD  ketorolac  (TORADOL ) 10 MG tablet Take 1 tablet (10 mg total) by mouth every 6 (six) hours as needed (pain). 11/07/23  Yes Vonna Sharlet POUR, MD  rizatriptan  (MAXALT ) 5 MG tablet Take 1 tablet (5 mg total) by mouth as needed for migraine. May repeat in 2 hours if  needed 01/05/23  Yes Rush Nest, MD  tamsulosin  (FLOMAX ) 0.4 MG CAPS capsule Take 1 capsule (0.4 mg total) by mouth daily. 11/07/23  Yes Vonna Sharlet POUR, MD  albuterol  (VENTOLIN  HFA) 108 (90 Base) MCG/ACT inhaler Inhale 1-2 puffs into the lungs every 6 (six) hours as needed for wheezing or shortness of breath. 09/20/23   Reddick, Johnathan B, NP  Iloperidone  (FANAPT ) 6 MG TABS Take 1 tablet (6 mg total) by mouth daily in the afternoon. 09/19/23   Harl Zane BRAVO, NP  Montelukast Sodium (SINGULAIR PO) Take by mouth. Patient not taking: Reported on 06/09/2023    [provider]  triamcinolone  cream (KENALOG ) 0.5 % Apply 1 Application topically 2 (two) times daily. To affected areas. Patient not taking: Reported on 06/09/2023 03/04/23   Towana Small, FNP  venlafaxine  XR (EFFEXOR  XR) 75 MG 24 hr  capsule Take 1 capsule (75 mg total) by mouth daily with breakfast. Patient not taking: Reported on 09/20/2023 07/22/23   Harl Zane BRAVO, NP    Family History Family History  Problem Relation Age of Onset   Headache Father    Alcohol abuse Father    Drug abuse Father    Headache Sister    ADD / ADHD Sister    Anxiety disorder Sister    Depression Sister    Obesity Paternal Grandmother    COPD Paternal Aunt     Social History Social History   Tobacco Use   Smoking status: Never   Smokeless tobacco: Never  Vaping Use   Vaping status: Never Used  Substance Use Topics   Alcohol use: Yes    Comment: occasionally   Drug use: Yes    Types: Marijuana     Allergies   Patient has no known allergies.   Review of Systems Review of Systems   Physical Exam Triage Vital Signs ED Triage Vitals  Encounter Vitals Group     BP 11/07/23 1243 117/85     Girls Systolic BP Percentile --      Girls Diastolic BP Percentile --      Boys Systolic BP Percentile --      Boys Diastolic BP Percentile --      Pulse Rate 11/07/23 1243 65     Resp 11/07/23 1243 18     Temp 11/07/23 1243 98.1 F (36.7 C)     Temp Source 11/07/23 1243 Oral     SpO2 11/07/23 1243 99 %     Weight --      Height --      Head Circumference --      Peak Flow --      Pain Score 11/07/23 1246 0     Pain Loc --      Pain Education --      Exclude from Growth Chart --    No data found.  Updated Vital Signs BP 117/85 (BP Location: Left Arm)   Pulse 65   Temp 98.1 F (36.7 C) (Oral)   Resp 18   LMP 10/20/2023 (Approximate)   SpO2 99%   Visual Acuity Right Eye Distance:   Left Eye Distance:   Bilateral Distance:    Right Eye Near:   Left Eye Near:    Bilateral Near:     Physical Exam Vitals reviewed.  Constitutional:      General: She is not in acute distress.    Appearance: She is not ill-appearing, toxic-appearing or diaphoretic.  Cardiovascular:     Rate and Rhythm: Normal  rate and  regular rhythm.  Pulmonary:     Effort: Pulmonary effort is normal.     Breath sounds: Normal breath sounds.  Musculoskeletal:     Comments: There is some mild tenderness of the right lumbar musculature.  No deformity  Skin:    Coloration: Skin is not jaundiced or pale.     Findings: No rash.  Neurological:     General: No focal deficit present.     Mental Status: She is alert and oriented to person, place, and time.  Psychiatric:        Behavior: Behavior normal.      UC Treatments / Results  Labs (all labs ordered are listed, but only abnormal results are displayed) Labs Reviewed  POCT URINALYSIS DIP (MANUAL ENTRY) - Abnormal; Notable for the following components:      Result Value   Color, UA straw (*)    Bilirubin, UA small (*)    Spec Grav, UA >=1.030 (*)    All other components within normal limits  POCT URINE PREGNANCY    EKG   Radiology DG Lumbar Spine 2-3 Views Result Date: 11/07/2023 CLINICAL DATA:  Low back pain. EXAM: LUMBAR SPINE - 2-3 VIEW COMPARISON:  None Available. FINDINGS: Five lumbar type vertebra. There is no acute fracture or subluxation of the lumbar spine. Mild degenerative changes and spurring. There is straightening of normal lumbar lordosis which may be positional or due to muscle spasm. The visualized posterior elements are intact. Faint radiopaque foci over the right flank may represent kidney stones. IMPRESSION: 1. No acute findings. 2. Mild degenerative changes. 3. Possible right kidney stones. Electronically Signed   By: Vanetta Chou M.D.   On: 11/07/2023 14:15    Procedures Procedures (including critical care time)  Medications Ordered in UC Medications  ketorolac  (TORADOL ) 30 MG/ML injection 30 mg (has no administration in time range)    Initial Impression / Assessment and Plan / UC Course  I have reviewed the triage vital signs and the nursing notes.  Pertinent labs & imaging results that were available during my care of the  patient were reviewed by me and considered in my medical decision making (see chart for details).     Urinalysis does not show any blood, nitrites, or leukocytes.  UPT is negative.  X-ray shows some very mild degenerative changes and some loss of lordosis which could mean spasm.  There is also a calcification that radiology reading states could be a renal stone.  Flomax  was sent and in case this were anything related to a renal stone. Toradol  still given for pain and Toradol  tablets are sent to the pharmacy.  Urine culture is sent  I have asked her to follow-up with her primary care  Final Clinical Impressions(s) / UC Diagnoses   Final diagnoses:  Acute right-sided low back pain without sciatica     Discharge Instructions      Pregnancy test was negative  Urinalysis did not show any blood or white blood cells or other signs of infection.  Your x-rays did show some very mild degenerative changes.  There also was one little white area that could be a sign of a kidney stone in your right kidney.  You have been given a shot of Toradol  30 mg today.  Ketorolac  10 mg tablets--take 1 tablet every 6 hours as needed for pain.  This is the same medicine that is in the shot we just gave you  Do not take the meloxicam  while  you are taking the ketorolac  tablets  Tamsulosin  0.4 mg--1 daily.  This is to help urinary flow  Follow-up with your primary care provider     ED Prescriptions     Medication Sig Dispense Auth. Provider   ketorolac  (TORADOL ) 10 MG tablet Take 1 tablet (10 mg total) by mouth every 6 (six) hours as needed (pain). 20 tablet Vonna Sharlet POUR, MD   tamsulosin  (FLOMAX ) 0.4 MG CAPS capsule Take 1 capsule (0.4 mg total) by mouth daily. 10 capsule Laiyah Exline K, MD      PDMP not reviewed this encounter.   Vonna Sharlet POUR, MD 11/07/23 [phone]

## 2023-11-07 NOTE — ED Triage Notes (Signed)
 Patient presents to the office for lower back pain and frequent urination. Patient states she was prescribed flexeril  for her back however this has not help.

## 2023-11-07 NOTE — Discharge Instructions (Addendum)
 Pregnancy test was negative  Urinalysis did not show any blood or white blood cells or other signs of infection.  Your x-rays did show some very mild degenerative changes.  There also was one little white area that could be a sign of a kidney stone in your right kidney.  You have been given a shot of Toradol  30 mg today.  Ketorolac  10 mg tablets--take 1 tablet every 6 hours as needed for pain.  This is the same medicine that is in the shot we just gave you  Do not take the meloxicam  while you are taking the ketorolac  tablets  Tamsulosin  0.4 mg--1 daily.  This is to help urinary flow  Follow-up with your primary care provider

## 2023-11-08 ENCOUNTER — Ambulatory Visit (HOSPITAL_COMMUNITY): Payer: MEDICAID

## 2023-11-11 ENCOUNTER — Telehealth (HOSPITAL_COMMUNITY): Payer: MEDICAID | Admitting: Psychiatry

## 2023-11-18 ENCOUNTER — Encounter (HOSPITAL_COMMUNITY): Payer: Self-pay

## 2023-11-18 ENCOUNTER — Telehealth (HOSPITAL_COMMUNITY): Payer: MEDICAID | Admitting: Family

## 2023-11-22 ENCOUNTER — Ambulatory Visit (INDEPENDENT_AMBULATORY_CARE_PROVIDER_SITE_OTHER): Payer: MEDICAID

## 2023-11-22 VITALS — BP 119/73 | HR 68 | Ht 66.0 in

## 2023-11-22 DIAGNOSIS — Z3201 Encounter for pregnancy test, result positive: Secondary | ICD-10-CM | POA: Diagnosis not present

## 2023-11-22 DIAGNOSIS — Z3200 Encounter for pregnancy test, result unknown: Secondary | ICD-10-CM

## 2023-11-22 DIAGNOSIS — Z1331 Encounter for screening for depression: Secondary | ICD-10-CM | POA: Diagnosis not present

## 2023-11-22 LAB — POCT URINE PREGNANCY: Preg Test, Ur: POSITIVE — AB

## 2023-11-22 NOTE — Progress Notes (Cosign Needed Addendum)
 Ms. Cordoba presents today for UPT. She has no unusual complaints.  LMP: 10/20/2023    OBJECTIVE: Appears well, in no apparent distress.  OB History     Gravida  3   Para  2   Term  2   Preterm      AB      Living  2      SAB      IAB      Ectopic      Multiple  0   Live Births  2          Home UPT Result: POSITIVE X2 In-Office UPT result: POSITIVE   I have reviewed the patient's medical, obstetrical, social, and family histories, and medications.   ASSESSMENT: Positive pregnancy test LMP  10/20/2023 EDD  07/26/2024 GA    [redacted]w[redacted]d  PLAN Prenatal care to be completed at: [facility]

## 2023-12-08 ENCOUNTER — Telehealth (HOSPITAL_COMMUNITY): Payer: MEDICAID | Admitting: Psychiatry

## 2023-12-12 ENCOUNTER — Ambulatory Visit (INDEPENDENT_AMBULATORY_CARE_PROVIDER_SITE_OTHER): Payer: MEDICAID | Admitting: *Deleted

## 2023-12-12 ENCOUNTER — Other Ambulatory Visit (INDEPENDENT_AMBULATORY_CARE_PROVIDER_SITE_OTHER): Payer: MEDICAID

## 2023-12-12 ENCOUNTER — Other Ambulatory Visit (HOSPITAL_COMMUNITY)
Admission: RE | Admit: 2023-12-12 | Discharge: 2023-12-12 | Disposition: A | Discharge status: Home / Self Care | Payer: MEDICAID | Source: Ambulatory Visit | Attending: Obstetrics and Gynecology | Admitting: Obstetrics and Gynecology

## 2023-12-12 VITALS — BP 113/73 | HR 68 | Wt 215.0 lb

## 2023-12-12 DIAGNOSIS — Z3481 Encounter for supervision of other normal pregnancy, first trimester: Secondary | ICD-10-CM

## 2023-12-12 DIAGNOSIS — O0990 Supervision of high risk pregnancy, unspecified, unspecified trimester: Secondary | ICD-10-CM | POA: Insufficient documentation

## 2023-12-12 DIAGNOSIS — O219 Vomiting of pregnancy, unspecified: Secondary | ICD-10-CM

## 2023-12-12 DIAGNOSIS — Z3A01 Less than 8 weeks gestation of pregnancy: Secondary | ICD-10-CM | POA: Diagnosis not present

## 2023-12-12 DIAGNOSIS — O3680X0 Pregnancy with inconclusive fetal viability, not applicable or unspecified: Secondary | ICD-10-CM

## 2023-12-12 DIAGNOSIS — O0991 Supervision of high risk pregnancy, unspecified, first trimester: Secondary | ICD-10-CM

## 2023-12-12 MED ORDER — VITAFOL GUMMIES 3.33-0.333-34.8 MG PO CHEW: 3.0000 | CHEWABLE_TABLET | Freq: Every day | ORAL | 11 refills | Status: AC

## 2023-12-12 MED ORDER — PROMETHAZINE HCL 25 MG PO TABS: 25.0000 mg | ORAL_TABLET | Freq: Four times a day (QID) | ORAL | 1 refills | Status: DC | PRN

## 2023-12-12 NOTE — Progress Notes (Signed)
 New OB Intake  I connected with Miranda Robertson  on 12/12/23 at  8:15 AM EDT by In Person Visit and verified that I am speaking with the correct person using two identifiers. Nurse is located at [employer] and pt is located at [place].  I discussed the limitations, risks, security and privacy concerns of performing an evaluation and management service by telephone and the availability of in person appointments. I also discussed with the patient that there may be a patient responsible charge related to this service. The patient expressed understanding and agreed to proceed.  I explained I am completing New OB Intake today. We discussed EDD of 07/26/24 based on LMP of 10/20/23. Pt is G3P2002. I reviewed her allergies, medications and Medical/Surgical/OB history.    Patient Active Problem List   Diagnosis Date Noted   Syphilis contact, untreated 06/09/2023   PTSD (post-traumatic stress disorder) 12/01/2022   Bipolar 1 disorder, mixed, partial remission (HCC) 04/28/2021   Bipolar I disorder (HCC) 11/27/2019   Attention deficit hyperactivity disorder (ADHD), 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   Previous cesarean section 05/22/2014   Gestational hypertension without significant proteinuria, antepartum 05/16/2014   Gestational hypertension    H/O cesarean section complicating pregnancy 05/06/2014   Pregnant 05/06/2014   Drug use affecting pregnancy in third trimester 04/30/2014   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 Discussed the nature of our practice with multiple providers including residents and students as well as female and female providers. Due to the size of the practice, the delivering provider may not be the same as those providing prenatal care.    MyChart/Babyscripts MyChart access verified. I explained pt will have some visits in office and some virtually. Babyscripts instructions given and order placed. Patient verifies receipt of registration text/e-mail. Account successfully created and app downloaded. If patient is a candidate for Optimized scheduling, add to sticky note.   Blood Pressure Cuff/Weight Scale Blood pressure cuff ordered for patient to pick-up from [employer]. Explained after first prenatal appt pt will check weekly and document in Babyscripts. Patient does not have weight scale; patient may purchase if they desire to track weight weekly in Babyscripts.  Anatomy US  Explained first scheduled US  will be around 19 weeks. Anatomy US  scheduled for TBD at TBD.  Is patient a candidate for Babyscripts Optimization? No, due to Risk Factors   First visit review I reviewed new OB appt with patient. Explained pt will be seen by Dr. Alger at first visit. Discussed [place] genetic screening with patient. Requests [facility] and [facility].. Routine prenatal labs OB Urine, GC/CC only collected at today's visit.   Last Pap Diagnosis  Date Value Ref Range Status  06/09/2023   Final   - Negative for intraepithelial lesion or malignancy (NILM)    Rocky Miranda [name], RN 12/12/2023  8:18 AM

## 2023-12-12 NOTE — Patient Instructions (Signed)

## 2023-12-13 LAB — CERVICOVAGINAL ANCILLARY ONLY
Chlamydia: NEGATIVE
Comment: NEGATIVE
Comment: NORMAL
Neisseria Gonorrhea: NEGATIVE

## 2023-12-16 LAB — CULTURE, OB URINE

## 2023-12-16 LAB — URINE CULTURE, OB REFLEX

## 2023-12-18 ENCOUNTER — Telehealth: Payer: MEDICAID | Admitting: Family

## 2023-12-18 DIAGNOSIS — N3000 Acute cystitis without hematuria: Secondary | ICD-10-CM | POA: Diagnosis not present

## 2023-12-18 DIAGNOSIS — Z3A08 8 weeks gestation of pregnancy: Secondary | ICD-10-CM

## 2023-12-18 MED ORDER — AMOXICILLIN 875 MG PO TABS: 875.0000 mg | ORAL_TABLET | Freq: Two times a day (BID) | ORAL | 0 refills | Status: DC

## 2023-12-18 NOTE — Progress Notes (Signed)
 Virtual Visit Consent   Miranda Robertson, you are scheduled for a virtual visit with a [facility] provider today. Just as with appointments in the office, your consent must be obtained to participate. Your consent will be active for this visit and any virtual visit you may have with one of our providers in the next 365 days. If you have a MyChart account, a copy of this consent can be sent to you electronically.  As this is a virtual visit, video technology does not allow for your provider to perform a traditional examination. This may limit your provider's ability to fully assess your condition. If your provider identifies any concerns that need to be evaluated in person or the need to arrange testing (such as labs, EKG, etc.), we will make arrangements to do so. Although advances in technology are sophisticated, we cannot ensure that it will always work on either your end or our end. If the connection with a video visit is poor, the visit may have to be switched to a telephone visit. With either a video or telephone visit, we are not always able to ensure that we have a secure connection.  By engaging in this virtual visit, you consent to the provision of healthcare and authorize for your insurance to be billed (if applicable) for the services provided during this visit. Depending on your insurance coverage, you may receive a charge related to this service.  I need to obtain your verbal consent now. Are you willing to proceed with your visit today? Miranda Robertson has provided verbal consent on 12/18/2023 for a virtual visit (video or telephone). Bari Learn, FNP  Date: 12/18/2023 10:28 AM   Virtual Visit via Video Note   I, Bari Learn, connected with  Miranda Robertson  ([id number], 1991-03-05) on 12/18/23 at 10:15 AM EDT by a video-enabled telemedicine application and verified that I am speaking with the correct person using two identifiers.  Location: Patient: Virtual Visit Location Patient:  Home Provider: Virtual Visit Location Provider: Home Office   I discussed the limitations of evaluation and management by telemedicine and the availability of in person appointments. The patient expressed understanding and agreed to proceed.    History of Present Illness: Miranda Robertson is a 33 y.o. who identifies as a female who was assigned female at birth, and is being seen today for abdominal pain. She was seen by her OB GYN and had a negative STD panel, BV. Her urine culture came back positive for beta hemolytic streptococcus.   She is [redacted] weeks pregnant. She is having dysuria.   HPI: Dysuria  This is a new problem. The current episode started yesterday. The problem occurs intermittently. The problem has been gradually worsening. The quality of the pain is described as burning. The pain is at a severity of 7/10. The pain is mild. There has been no fever. Associated symptoms include frequency. Pertinent negatives include no hematuria, nausea, sweats, urgency or vomiting. She has tried increased fluids for the symptoms. The treatment provided mild relief.    Problems:  Patient Active Problem List   Diagnosis Date Noted   Supervision of high risk pregnancy, antepartum 12/12/2023   Syphilis contact, untreated 06/09/2023   PTSD (post-traumatic stress disorder) 12/01/2022   Bipolar 1 disorder, mixed, partial remission (HCC) 04/28/2021   Bipolar I disorder (HCC) 11/27/2019   Attention deficit hyperactivity disorder (ADHD), 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   Previous cesarean section 05/22/2014   Gestational hypertension without significant proteinuria, antepartum 05/16/2014   Gestational hypertension  H/O cesarean section complicating pregnancy 05/06/2014   Pregnant 05/06/2014   Drug use affecting pregnancy in third trimester 04/30/2014   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   Lost custody of children 04/30/2014   Previous cesarean delivery, antepartum 04/30/2014    Depressive disorder, 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    Allergies: No Known Allergies Medications:  Current Outpatient Medications:    amoxicillin  (AMOXIL ) 875 MG tablet, Take 1 tablet (875 mg total) by mouth 2 (two) times daily., Disp: 14 tablet, Rfl: 0   albuterol  (VENTOLIN  HFA) 108 (90 Base) MCG/ACT inhaler, Inhale 1-2 puffs into the lungs every 6 (six) hours as needed for wheezing or shortness of breath., Disp: 6.7 g, Rfl: 0   Prenatal Vit-Fe Phos-FA-Omega (VITAFOL  GUMMIES) 3.33-0.333-34.8 MG CHEW, Chew 3 tablets by mouth daily., Disp: 90 tablet, Rfl: 11   promethazine  (PHENERGAN ) 25 MG tablet, Take 1 tablet (25 mg total) by mouth every 6 (six) hours as needed for nausea or vomiting., Disp: 30 tablet, Rfl: 1   tamsulosin  (FLOMAX ) 0.4 MG CAPS capsule, Take 1 capsule (0.4 mg total) by mouth daily., Disp: 10 capsule, Rfl: 0   venlafaxine  XR (EFFEXOR  XR) 75 MG 24 hr capsule, Take 1 capsule (75 mg total) by mouth daily with breakfast. (Patient not taking: Reported on 12/12/2023), Disp: 30 capsule, Rfl: 3  Observations/Objective: Patient is well-developed, well-nourished in no acute distress.  Resting comfortably  at home.  Head is normocephalic, atraumatic.  No labored breathing.  Speech is clear and coherent with logical content.  Patient is alert and oriented at baseline.    Assessment and Plan: 1. Acute cystitis without hematuria (Primary) - amoxicillin  (AMOXIL ) 875 MG tablet; Take 1 tablet (875 mg total) by mouth 2 (two) times daily.  Dispense: 14 tablet; Refill: 0  2. [redacted] weeks gestation of pregnancy  Given urine culture positive, will send in Amoxicillin   Force fluids Keep follow up with GYN If pain continues needs to be seen face to face  Follow Up Instructions: I discussed the assessment and treatment plan with the patient. The patient was provided an opportunity to ask questions and all were answered. The patient agreed with the plan and demonstrated an understanding of the instructions.  A  copy of instructions were sent to the patient via MyChart unless otherwise noted below.    The patient was advised to call back or seek an in-person evaluation if the symptoms worsen or if the condition fails to improve as anticipated.    Bari Learn, FNP

## 2023-12-19 ENCOUNTER — Encounter (HOSPITAL_COMMUNITY): Payer: Self-pay | Admitting: Radiology

## 2023-12-19 ENCOUNTER — Other Ambulatory Visit: Payer: Self-pay

## 2023-12-19 ENCOUNTER — Inpatient Hospital Stay (HOSPITAL_COMMUNITY)
Admission: AD | Admit: 2023-12-19 | Discharge: 2023-12-20 | Disposition: A | Discharge status: Home / Self Care | Payer: MEDICAID | Attending: Obstetrics & Gynecology | Admitting: Obstetrics & Gynecology

## 2023-12-19 DIAGNOSIS — R109 Unspecified abdominal pain: Secondary | ICD-10-CM | POA: Insufficient documentation

## 2023-12-19 DIAGNOSIS — R1030 Lower abdominal pain, unspecified: Secondary | ICD-10-CM | POA: Diagnosis present

## 2023-12-19 DIAGNOSIS — O99611 Diseases of the digestive system complicating pregnancy, first trimester: Secondary | ICD-10-CM | POA: Insufficient documentation

## 2023-12-19 DIAGNOSIS — K5900 Constipation, unspecified: Secondary | ICD-10-CM | POA: Insufficient documentation

## 2023-12-19 DIAGNOSIS — B3731 Acute candidiasis of vulva and vagina: Secondary | ICD-10-CM

## 2023-12-19 DIAGNOSIS — R519 Headache, unspecified: Secondary | ICD-10-CM | POA: Diagnosis not present

## 2023-12-19 DIAGNOSIS — O26891 Other specified pregnancy related conditions, first trimester: Secondary | ICD-10-CM | POA: Diagnosis not present

## 2023-12-19 DIAGNOSIS — Z3A08 8 weeks gestation of pregnancy: Secondary | ICD-10-CM | POA: Insufficient documentation

## 2023-12-19 DIAGNOSIS — O0990 Supervision of high risk pregnancy, unspecified, unspecified trimester: Secondary | ICD-10-CM

## 2023-12-19 LAB — COMPREHENSIVE METABOLIC PANEL WITH GFR
ALT: 13 U/L (ref 0–44)
AST: 15 U/L (ref 15–41)
Albumin: 3.6 g/dL (ref 3.5–5.0)
Alkaline Phosphatase: 58 U/L (ref 38–126)
Anion gap: 9 (ref 5–15)
BUN: <5 mg/dL — ABNORMAL LOW (ref 6–20)
CO2: 21 mmol/L — ABNORMAL LOW (ref 22–32)
Calcium: 9.3 mg/dL (ref 8.9–10.3)
Chloride: 105 mmol/L (ref 98–111)
Creatinine, Ser: 0.51 mg/dL (ref 0.44–1.00)
GFR, Estimated: >60 mL/min (ref >60)
Glucose, Bld: 102 mg/dL — ABNORMAL HIGH (ref 70–99)
Potassium: 4 mmol/L (ref 3.5–5.1)
Sodium: 135 mmol/L (ref 135–145)
Total Bilirubin: 0.5 mg/dL (ref 0.0–1.2)
Total Protein: 6.9 g/dL (ref 6.5–8.1)

## 2023-12-19 LAB — URINALYSIS, ROUTINE W REFLEX MICROSCOPIC
Bacteria, UA: NONE SEEN
Bilirubin Urine: NEGATIVE
Glucose, UA: NEGATIVE mg/dL
Hgb urine dipstick: NEGATIVE
Ketones, ur: NEGATIVE mg/dL
Nitrite: NEGATIVE
Protein, ur: NEGATIVE mg/dL
Specific Gravity, Urine: 1.019 (ref 1.005–1.030)
pH: 5 (ref 5.0–8.0)

## 2023-12-19 LAB — CBC
HCT: 39 % (ref 36.0–46.0)
Hemoglobin: 13.1 g/dL (ref 12.0–15.0)
MCH: 29.2 pg (ref 26.0–34.0)
MCHC: 33.6 g/dL (ref 30.0–36.0)
MCV: 87.1 fL (ref 80.0–100.0)
Platelets: 337 K/uL (ref 150–400)
RBC: 4.48 MIL/uL (ref 3.87–5.11)
RDW: 12.7 % (ref 11.5–15.5)
WBC: 13.3 K/uL — ABNORMAL HIGH (ref 4.0–10.5)
nRBC: 0 % (ref 0.0–0.2)

## 2023-12-19 LAB — LIPASE, BLOOD: Lipase: 27 U/L (ref 11–51)

## 2023-12-19 NOTE — ED Provider Notes (Signed)
 Emergency Medicine Provider OB Triage Evaluation Note  Miranda Robertson is a 33 y.o. female, G3P2002, at [redacted]w[redacted]d gestation who presents to the emergency department with complaints of abdominal pain.  Review of  Systems  Positive: Abdominal pain, nausea Negative: Vaginal bleeding, discharge  Physical Exam  BP 129/89 (BP Location: Right Arm)   Pulse (!) 107   Temp 98.1 F (36.7 C)   Resp 18   Ht 5' 6 (1.676 m)   Wt 95.3 kg   LMP 10/20/2023 (Exact Date)   SpO2 95%   BMI 33.89 kg/m  General: Awake, no distress  HEENT: Atraumatic  Resp: Normal effort  Cardiac: Normal rate Abd: Nondistended, nontender  MSK: Moves all extremities without difficulty Neuro: Speech clear  Medical Decision Making  Pt evaluated for pregnancy concern and is stable for transfer to MAU. Pt is in agreement with plan for transfer.  11:44 PM Discussed with MAU Dr. Magali, who accepts patient in transfer.  Clinical Impression   1. Lower abdominal pain        Miranda Ozell HERO, MD 12/19/23 [id number]

## 2023-12-19 NOTE — ED Triage Notes (Signed)
 Pt states she is [redacted]wks pregnant, reports constipation and vomiting.

## 2023-12-19 NOTE — ED Triage Notes (Signed)
Pt also reports headache 

## 2023-12-19 NOTE — ED Notes (Signed)
 Called and spoke to [place], [place]. Although pt is pregnant, since her cc is not necessarily related, her care will be done by the ED.

## 2023-12-19 NOTE — ED Triage Notes (Signed)
 Pt report that for the past 4 days she has been constipated and vomiting due to being pregnant. She is now dizzy. She is now having sharp pain in her lower abd.

## 2023-12-20 ENCOUNTER — Encounter: Payer: Self-pay | Admitting: Obstetrics and Gynecology

## 2023-12-20 DIAGNOSIS — R8271 Bacteriuria: Secondary | ICD-10-CM | POA: Insufficient documentation

## 2023-12-20 LAB — GC/CHLAMYDIA PROBE AMP (~~LOC~~) NOT AT ARMC
Chlamydia: NEGATIVE
Comment: NEGATIVE
Comment: NORMAL
Neisseria Gonorrhea: NEGATIVE

## 2023-12-20 LAB — WET PREP, GENITAL
Clue Cells Wet Prep HPF POC: NONE SEEN
Sperm: NONE SEEN
Trich, Wet Prep: NONE SEEN
WBC, Wet Prep HPF POC: <10 (ref <10)

## 2023-12-20 LAB — HCG, QUANTITATIVE, PREGNANCY: hCG, Beta Chain, Quant, S: 179521 m[IU]/mL — ABNORMAL HIGH (ref <5)

## 2023-12-20 MED ORDER — POLYETHYLENE GLYCOL 3350 17 G PO PACK: 17.0000 g | PACK | Freq: Two times a day (BID) | ORAL | 0 refills | Status: AC

## 2023-12-20 MED ORDER — METOCLOPRAMIDE HCL 10 MG PO TABS: 10.0000 mg | ORAL_TABLET | Freq: Three times a day (TID) | ORAL | 0 refills | Status: DC

## 2023-12-20 MED ORDER — ACETAMINOPHEN 500 MG PO TABS
1000.0000 mg | ORAL_TABLET | Freq: Once | ORAL | Status: AC
  Administered 2023-12-20: 1000 mg via ORAL
  Filled 2023-12-20: qty 2

## 2023-12-20 MED ORDER — METOCLOPRAMIDE HCL 10 MG PO TABS
10.0000 mg | ORAL_TABLET | Freq: Once | ORAL | Status: AC
  Administered 2023-12-20: 10 mg via ORAL
  Filled 2023-12-20: qty 1

## 2023-12-20 MED ORDER — FLUCONAZOLE 150 MG PO TABS: ORAL_TABLET | ORAL | 0 refills | Status: DC

## 2023-12-20 NOTE — MAU Note (Addendum)
 Pt says she went to [facility] ER tonight at 1030pm- for lower pain -  She thought it was constipation - so she took Mirilax and 2 stool softeners today . No results yet.  Last BM was last Thursday  No VB  [facility]- [facility] Has vomited today

## 2023-12-20 NOTE — MAU Provider Note (Signed)
 History     CSN: [id number]  Arrival date and time: 12/19/23 2224   None     Chief Complaint  Patient presents with   [redacted] wks pregnant   Constipation   Headache   Abdominal Pain   Patient is a 33 year old G3 P2-0-0-2 at 8 weeks 5 days who presents to MAU after being transferred from the ED for abdominal pain.  When she arrives at in the ER, complains of constipation, nausea, and vomiting.  Last vomited earlier in the day.  Able to tolerate little bits of p.o. intake.  Denies fever, chills.  Denies vaginal bleeding, leakage of fluid or contractions.    Past Medical History:  Diagnosis Date   Allergy    Anemia    Anxiety    Asthma    Childhood   Blood transfusion without reported diagnosis    Depression    HSV (herpes simplex virus) infection    Mental disorder    bipolar, depression, mild menatl retardation, suicidal thoughts in 2010   Substance abuse [facility])     Past Surgical History:  Procedure Laterality Date   CESAREAN SECTION  06/09/2012   FTP   CESAREAN SECTION N/A 05/21/2014   Procedure: CESAREAN SECTION;  Surgeon: Glenys GORMAN Birk, MD;  Location: [facility];  Service: Obstetrics;  Laterality: N/A;   CHOLECYSTECTOMY     WISDOM TOOTH EXTRACTION      Family History  Problem Relation Age of Onset   Headache Father    Alcohol abuse Father    Drug abuse Father    Headache Sister    ADD / ADHD Sister    Anxiety disorder Sister    Depression Sister    Obesity Paternal Grandmother    COPD Paternal Aunt     Social History   Tobacco Use   Smoking status: Never   Smokeless tobacco: Never  Vaping Use   Vaping status: Never Used  Substance Use Topics   Alcohol use: Not Currently   Drug use: Not Currently    Types: Marijuana    Allergies: No Known Allergies  No medications prior to admission.    ROS: Negative aside from what is listed in the HPI   Physical Exam   Blood pressure 116/77, pulse 73, temperature 98 F (36.7 C), temperature source Oral, resp.  rate 12, height 5' 6 (1.676 m), weight 94.4 kg, last menstrual period 10/20/2023, SpO2 100%.  Physical Exam Vitals and nursing note reviewed.  Constitutional:      Appearance: She is well-developed.  HENT:     Head: Normocephalic and atraumatic.     Mouth/Throat:     Mouth: Mucous membranes are moist.  Eyes:     Extraocular Movements: Extraocular movements intact.  Cardiovascular:     Rate and Rhythm: Normal rate and regular rhythm.  Pulmonary:     Effort: Pulmonary effort is normal.  Abdominal:     Palpations: Abdomen is soft.     Tenderness: There is no abdominal tenderness.  Skin:    Capillary Refill: Capillary refill takes less than 2 seconds.  Neurological:     General: No focal deficit present.     Mental Status: She is alert.     MAU Course    MDM -straightforward  Assessment and Plan   Patient is a 33 year old G3 P2-0-0-2 at 8 weeks 5 days who presents to MAU after being transferred from the ED for abdominal pain.  -CBC, CMP obtained in the ED unremarkable - hCG obtained in  the ED appropriate for gestational age - Wet prep consistent with vaginal candidiasis - Given Tylenol  and Reglan  in the MAU with good effect  - Safe to discharge home - Prescription sent for Reglan , MiraLAX , Diflucan  - All questions answered, anticipatory guidance, detailed return precautions provided.  Miranda Robertson L Nazir Hacker 12/20/2023, 2:22 AM

## 2023-12-22 ENCOUNTER — Telehealth: Payer: Self-pay

## 2023-12-22 NOTE — Telephone Encounter (Signed)
 Error

## 2024-01-02 ENCOUNTER — Encounter: Payer: MEDICAID | Admitting: Obstetrics and Gynecology

## 2024-01-02 DIAGNOSIS — O0990 Supervision of high risk pregnancy, unspecified, unspecified trimester: Secondary | ICD-10-CM

## 2024-01-02 DIAGNOSIS — Z3A10 10 weeks gestation of pregnancy: Secondary | ICD-10-CM

## 2024-01-09 ENCOUNTER — Telehealth: Payer: MEDICAID

## 2024-01-09 ENCOUNTER — Telehealth: Payer: MEDICAID | Admitting: Physician Assistant

## 2024-01-09 DIAGNOSIS — K047 Periapical abscess without sinus: Secondary | ICD-10-CM | POA: Diagnosis not present

## 2024-01-09 MED ORDER — PENICILLIN V POTASSIUM 500 MG PO TABS: 500.0000 mg | ORAL_TABLET | Freq: Three times a day (TID) | ORAL | 0 refills | Status: AC

## 2024-01-09 MED ORDER — CHLORHEXIDINE GLUCONATE 0.12 % MT SOLN: 15.0000 mL | Freq: Two times a day (BID) | OROMUCOSAL | 0 refills | Status: DC

## 2024-01-09 NOTE — Progress Notes (Signed)
 Virtual Visit Consent   Miranda Robertson, you are scheduled for a virtual visit with a [facility] provider today. Just as with appointments in the office, your consent must be obtained to participate. Your consent will be active for this visit and any virtual visit you may have with one of our providers in the next 365 days. If you have a MyChart account, a copy of this consent can be sent to you electronically.  As this is a virtual visit, video technology does not allow for your provider to perform a traditional examination. This may limit your provider's ability to fully assess your condition. If your provider identifies any concerns that need to be evaluated in person or the need to arrange testing (such as labs, EKG, etc.), we will make arrangements to do so. Although advances in technology are sophisticated, we cannot ensure that it will always work on either your end or our end. If the connection with a video visit is poor, the visit may have to be switched to a telephone visit. With either a video or telephone visit, we are not always able to ensure that we have a secure connection.  By engaging in this virtual visit, you consent to the provision of healthcare and authorize for your insurance to be billed (if applicable) for the services provided during this visit. Depending on your insurance coverage, you may receive a charge related to this service.  I need to obtain your verbal consent now. Are you willing to proceed with your visit today? Miranda Robertson has provided verbal consent on 01/09/2024 for a virtual visit (video or telephone). Elsie Velma Lunger, [place]  Date: 01/09/2024 10:08 AM   Virtual Visit via Video Note   I, Elsie Velma Lunger, connected with  Miranda Robertson  ([id number], 08/24/1990) on 01/09/24 at 10:00 AM EDT by a video-enabled telemedicine application and verified that I am speaking with the correct person using two identifiers.  Location: Patient: Virtual Visit Location  Patient: Home Provider: Virtual Visit Location Provider: Home Office   I discussed the limitations of evaluation and management by telemedicine and the availability of in person appointments. The patient expressed understanding and agreed to proceed.    History of Present Illness: Miranda Robertson is a 33 y.o. who identifies as a female who was assigned female at birth, and is being seen today for concern for dental abscess of R lower molar. Notes prior history of issue with this tooth, including some chronic dental pain. Notes over past few days with acute increase in pain associated with swelling around the base of the tooth. Denies fever, chills. Is currently 71 w pregnant. Does have dental provider but currently cannot afford the procedure they are wanting to schedule for her tooth.   HPI: HPI  Problems:  Patient Active Problem List   Diagnosis Date Noted   GBS bacteriuria 12/20/2023   Supervision of high risk pregnancy, antepartum 12/12/2023   Syphilis contact, untreated 06/09/2023   PTSD (post-traumatic stress disorder) 12/01/2022   Bipolar 1 disorder, mixed, partial remission (HCC) 04/28/2021   Bipolar I disorder (HCC) 11/27/2019   Attention deficit hyperactivity disorder (ADHD), 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   Previous cesarean section 05/22/2014   Gestational hypertension without significant proteinuria, antepartum 05/16/2014   Gestational hypertension    H/O cesarean section complicating pregnancy 05/06/2014   Pregnant 05/06/2014   Drug use affecting pregnancy in third trimester 04/30/2014   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   Lost custody of children 04/30/2014  Previous cesarean delivery, antepartum 04/30/2014   Depressive disorder, 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    Allergies: No Known Allergies Medications:  Current Outpatient Medications:    penicillin  v potassium (VEETID) 500 MG tablet, Take 1 tablet (500 mg total) by mouth 3 (three) times daily for 10  days., Disp: 30 tablet, Rfl: 0   albuterol  (VENTOLIN  HFA) 108 (90 Base) MCG/ACT inhaler, Inhale 1-2 puffs into the lungs every 6 (six) hours as needed for wheezing or shortness of breath., Disp: 6.7 g, Rfl: 0   fluconazole  (DIFLUCAN ) 150 MG tablet, Take 1 tablet immediately and take second tablet 72 hours later., Disp: 2 tablet, Rfl: 0   metoCLOPramide  (REGLAN ) 10 MG tablet, Take 1 tablet (10 mg total) by mouth 3 (three) times daily with meals., Disp: 90 tablet, Rfl: 0   polyethylene glycol (MIRALAX ) 17 g packet, Take 17 g by mouth 2 (two) times daily., Disp: 180 packet, Rfl: 0   Prenatal Vit-Fe Phos-FA-Omega (VITAFOL  GUMMIES) 3.33-0.333-34.8 MG CHEW, Chew 3 tablets by mouth daily., Disp: 90 tablet, Rfl: 11  Observations/Objective: Patient is well-developed, well-nourished in no acute distress.  Resting comfortably  at home.  Head is normocephalic, atraumatic.  No labored breathing.  Speech is clear and coherent with logical content.  Patient is alert and oriented at baseline.   Assessment and Plan: 1. Dental abscess (Primary) - penicillin  v potassium (VEETID) 500 MG tablet; Take 1 tablet (500 mg total) by mouth 3 (three) times daily for 10 days.  Dispense: 30 tablet; Refill: 0  Supportive measures and OTC medications reviewed. Giving pregnancy status, will send in Penicillin  C TID x 10 days. Peridex  mouth rinse per orders. Tylenol  OTC. ER precautions reviewed.   Follow Up Instructions: I discussed the assessment and treatment plan with the patient. The patient was provided an opportunity to ask questions and all were answered. The patient agreed with the plan and demonstrated an understanding of the instructions.  A copy of instructions were sent to the patient via MyChart unless otherwise noted below.   The patient was advised to call back or seek an in-person evaluation if the symptoms worsen or if the condition fails to improve as anticipated.    Elsie Velma Lunger, PA-C

## 2024-01-09 NOTE — Patient Instructions (Signed)
 Miranda Robertson, thank you for joining Elsie Velma Lunger, PA-C for today's virtual visit.  While this provider is not your primary care provider (PCP), if your PCP is located in our provider database this encounter information will be shared with them immediately following your visit.   [facility] MyChart account gives you access to today's visit and all your visits, tests, and labs performed at [facility]  click here if you don't have a [facility] MyChart account or go to [email or web address]  Consent: (Patient) Miranda Robertson provided verbal consent for this virtual visit at the beginning of the encounter.  Current Medications:  Current Outpatient Medications:    penicillin  v potassium (VEETID) 500 MG tablet, Take 1 tablet (500 mg total) by mouth 3 (three) times daily for 10 days., Disp: 30 tablet, Rfl: 0   albuterol  (VENTOLIN  HFA) 108 (90 Base) MCG/ACT inhaler, Inhale 1-2 puffs into the lungs every 6 (six) hours as needed for wheezing or shortness of breath., Disp: 6.7 g, Rfl: 0   fluconazole  (DIFLUCAN ) 150 MG tablet, Take 1 tablet immediately and take second tablet 72 hours later., Disp: 2 tablet, Rfl: 0   metoCLOPramide  (REGLAN ) 10 MG tablet, Take 1 tablet (10 mg total) by mouth 3 (three) times daily with meals., Disp: 90 tablet, Rfl: 0   polyethylene glycol (MIRALAX ) 17 g packet, Take 17 g by mouth 2 (two) times daily., Disp: 180 packet, Rfl: 0   Prenatal Vit-Fe Phos-FA-Omega (VITAFOL  GUMMIES) 3.33-0.333-34.8 MG CHEW, Chew 3 tablets by mouth daily., Disp: 90 tablet, Rfl: 11   Medications ordered in this encounter:  Meds ordered this encounter  Medications   penicillin  v potassium (VEETID) 500 MG tablet    Sig: Take 1 tablet (500 mg total) by mouth 3 (three) times daily for 10 days.    Dispense:  30 tablet    Refill:  0    Supervising Provider:   LAMPTEY, PHILIP O [[id number]]     *If you need refills on other medications prior to your next appointment, please  contact your pharmacy*  Follow-Up: Call back or seek an in-person evaluation if the symptoms worsen or if the condition fails to improve as anticipated.  [facility] [phone]  Other Instructions Dental Abscess  A dental abscess is an area of pus in or around a tooth. It comes from an infection. It can cause pain and other symptoms. Treatment will help with symptoms and prevent the infection from spreading. What are the causes? This condition is caused by an infection in or around the tooth. This can be from: Very bad tooth decay (cavities). A bad injury to the tooth, such as a broken or chipped tooth. What increases the risk? The risk to get an abscess is higher in males. It is also more likely in people who: Have dental decay. Have very bad gum disease. Eat sugary snacks between meals. Use tobacco. Have diabetes. Have a weak disease-fighting system (immune system). Do not brush their teeth regularly. What are the signs or symptoms? Some mild symptoms are: Tenderness. Bad breath. Fever. A sharp, sour taste in the mouth. Pain in and around the infected tooth. Worse symptoms of this condition include: Swollen neck glands. Chills. Pus draining around the tooth. Swelling and redness around the tooth, the mouth, or the face. Very bad pain in and around the tooth. The worst symptoms can include: Difficulty swallowing. Difficulty opening your mouth. Feeling like you may vomit or vomiting. How is this treated? This is  treated by getting rid of the infection. Your dentist will discuss ways to do this, including: Antibiotic medicines. Antibacterial mouth rinse. An incision in the abscess to drain out the pus. A root canal. Removing the tooth. Follow these instructions at home: Medicines Take over-the-counter and prescription medicines only as told by your dentist. If you were prescribed an antibiotic medicine, take it as told by your dentist. Do not stop  taking it even if you start to feel better. If you were prescribed a gel that has numbing medicine in it, use it exactly as told. Ask your dentist if you should avoid driving or using machines while you are taking your medicine. General instructions Rinse your mouth often with salt water. To make salt water, dissolve -1 tsp (3-6 g) of salt in 1 cup (237 mL) of warm water. Eat a soft diet while your mouth is healing. Drink enough fluid to keep your pee (urine) pale yellow. Do not apply heat to the outside of your mouth. Do not smoke or use any products that contain nicotine or tobacco. If you need help quitting, ask your dentist. Keep all follow-up visits. Prevent an abscess Brush your teeth every morning and every night. Use fluoride toothpaste. Floss your teeth each day. Get dental cleanings as often as told by your dentist. Think about getting dental sealant put on teeth that have deep holes (decay). Drink water that has fluoride in it. Most tap water has fluoride. Check the label on bottled water to see if it has fluoride in it. Drink water instead of sugary drinks. Eat healthy meals and snacks. Wear a mouth guard or face shield when you play sports. Contact a doctor if: Your pain is worse and medicine does not help. Get help right away if: You have a fever or chills. Your symptoms suddenly get worse. You have a very bad headache. You have problems breathing or swallowing. You have trouble opening your mouth. You have swelling in your neck or close to your eye. These symptoms may be an emergency. Get help right away. Call your local emergency services (911 in the [place] Do not wait to see if the symptoms will go away. Do not drive yourself to the hospital. Summary A dental abscess is an area of pus in or around a tooth. It is caused by an infection. Treatment will help with symptoms and prevent the infection from spreading. Take over-the-counter and prescription medicines only  as told by your dentist. To prevent an abscess, take good care of your teeth. Brush your teeth every morning and night. Use floss every day. Get dental cleanings as often as told by your dentist. This information is not intended to replace advice given to you by your health care provider. Make sure you discuss any questions you have with your health care provider. Document Revised: 06/11/2020 Document Reviewed: 06/12/2020 Elsevier Patient Education  2024 Elsevier Inc.   If you have been instructed to have an in-person evaluation today at a local Urgent Care facility, please use the link below. It will take you to a list of all of our available [facility], including address, phone number and hours of operation. Please do not delay care.  [facility]  If you or a family member do not have a primary care provider, use the link below to schedule a visit and establish care. When you choose a [facility] primary care physician or advanced practice provider, you gain a long-term partner in health.  Find a Primary Care Provider  Learn more about [facility] in-office and virtual care options: [facility] - Get Care Now

## 2024-01-13 ENCOUNTER — Encounter: Payer: MEDICAID | Admitting: Obstetrics & Gynecology

## 2024-02-02 ENCOUNTER — Inpatient Hospital Stay (HOSPITAL_COMMUNITY)
Admission: RE | Admit: 2024-02-02 | Discharge: 2024-02-02 | Disposition: A | Discharge status: Home / Self Care | Payer: MEDICAID | Source: Ambulatory Visit | Attending: Family Medicine | Admitting: Family Medicine

## 2024-02-02 ENCOUNTER — Telehealth (HOSPITAL_COMMUNITY): Payer: Self-pay

## 2024-02-02 NOTE — Telephone Encounter (Signed)
 Patient has been advised to go to [facility] for abdominal care in pregnancy.

## 2024-02-07 ENCOUNTER — Encounter (HOSPITAL_COMMUNITY): Payer: Self-pay | Admitting: Obstetrics & Gynecology

## 2024-02-07 ENCOUNTER — Other Ambulatory Visit: Payer: Self-pay

## 2024-02-07 ENCOUNTER — Inpatient Hospital Stay (HOSPITAL_COMMUNITY): Payer: MEDICAID

## 2024-02-07 ENCOUNTER — Inpatient Hospital Stay (HOSPITAL_COMMUNITY)
Admission: AD | Admit: 2024-02-07 | Discharge: 2024-02-07 | Disposition: A | Discharge status: Home / Self Care | Payer: MEDICAID | Attending: Obstetrics & Gynecology | Admitting: Obstetrics & Gynecology

## 2024-02-07 DIAGNOSIS — O26892 Other specified pregnancy related conditions, second trimester: Secondary | ICD-10-CM

## 2024-02-07 DIAGNOSIS — Z9049 Acquired absence of other specified parts of digestive tract: Secondary | ICD-10-CM | POA: Insufficient documentation

## 2024-02-07 DIAGNOSIS — O321XX0 Maternal care for breech presentation, not applicable or unspecified: Secondary | ICD-10-CM | POA: Insufficient documentation

## 2024-02-07 DIAGNOSIS — R8271 Bacteriuria: Secondary | ICD-10-CM

## 2024-02-07 DIAGNOSIS — R102 Pelvic and perineal pain unspecified side: Secondary | ICD-10-CM | POA: Insufficient documentation

## 2024-02-07 DIAGNOSIS — O98512 Other viral diseases complicating pregnancy, second trimester: Secondary | ICD-10-CM | POA: Insufficient documentation

## 2024-02-07 DIAGNOSIS — R1031 Right lower quadrant pain: Secondary | ICD-10-CM | POA: Diagnosis not present

## 2024-02-07 DIAGNOSIS — Z3A15 15 weeks gestation of pregnancy: Secondary | ICD-10-CM | POA: Diagnosis not present

## 2024-02-07 DIAGNOSIS — O468X2 Other antepartum hemorrhage, second trimester: Secondary | ICD-10-CM | POA: Insufficient documentation

## 2024-02-07 DIAGNOSIS — A6009 Herpesviral infection of other urogenital tract: Secondary | ICD-10-CM | POA: Diagnosis not present

## 2024-02-07 DIAGNOSIS — O23592 Infection of other part of genital tract in pregnancy, second trimester: Secondary | ICD-10-CM | POA: Diagnosis not present

## 2024-02-07 DIAGNOSIS — B9689 Other specified bacterial agents as the cause of diseases classified elsewhere: Secondary | ICD-10-CM | POA: Diagnosis not present

## 2024-02-07 DIAGNOSIS — O0990 Supervision of high risk pregnancy, unspecified, unspecified trimester: Secondary | ICD-10-CM

## 2024-02-07 DIAGNOSIS — B009 Herpesviral infection, unspecified: Secondary | ICD-10-CM | POA: Diagnosis not present

## 2024-02-07 LAB — COMPREHENSIVE METABOLIC PANEL WITH GFR
ALT: 12 U/L (ref 0–44)
AST: 14 U/L — ABNORMAL LOW (ref 15–41)
Albumin: 3 g/dL — ABNORMAL LOW (ref 3.5–5.0)
Alkaline Phosphatase: 59 U/L (ref 38–126)
Anion gap: 9 (ref 5–15)
BUN: 6 mg/dL (ref 6–20)
CO2: 23 mmol/L (ref 22–32)
Calcium: 9.1 mg/dL (ref 8.9–10.3)
Chloride: 103 mmol/L (ref 98–111)
Creatinine, Ser: 0.51 mg/dL (ref 0.44–1.00)
GFR, Estimated: >60 mL/min (ref >60)
Glucose, Bld: 87 mg/dL (ref 70–99)
Potassium: 3.7 mmol/L (ref 3.5–5.1)
Sodium: 135 mmol/L (ref 135–145)
Total Bilirubin: 0.5 mg/dL (ref 0.0–1.2)
Total Protein: 6.6 g/dL (ref 6.5–8.1)

## 2024-02-07 LAB — CBC WITH DIFFERENTIAL/PLATELET
Abs Immature Granulocytes: 0.09 K/uL — ABNORMAL HIGH (ref 0.00–0.07)
Basophils Absolute: 0 K/uL (ref 0.0–0.1)
Basophils Relative: 0 %
Eosinophils Absolute: 0.1 K/uL (ref 0.0–0.5)
Eosinophils Relative: 1 %
HCT: 33.8 % — ABNORMAL LOW (ref 36.0–46.0)
Hemoglobin: 11.4 g/dL — ABNORMAL LOW (ref 12.0–15.0)
Immature Granulocytes: 1 %
Lymphocytes Relative: 23 %
Lymphs Abs: 2.9 K/uL (ref 0.7–4.0)
MCH: 29.2 pg (ref 26.0–34.0)
MCHC: 33.7 g/dL (ref 30.0–36.0)
MCV: 86.4 fL (ref 80.0–100.0)
Monocytes Absolute: 0.8 K/uL (ref 0.1–1.0)
Monocytes Relative: 6 %
Neutro Abs: 8.7 K/uL — ABNORMAL HIGH (ref 1.7–7.7)
Neutrophils Relative %: 69 %
Platelets: 326 K/uL (ref 150–400)
RBC: 3.91 MIL/uL (ref 3.87–5.11)
RDW: 13.1 % (ref 11.5–15.5)
WBC: 12.6 K/uL — ABNORMAL HIGH (ref 4.0–10.5)
nRBC: 0 % (ref 0.0–0.2)

## 2024-02-07 LAB — URINALYSIS, ROUTINE W REFLEX MICROSCOPIC
Bilirubin Urine: NEGATIVE
Glucose, UA: NEGATIVE mg/dL
Hgb urine dipstick: NEGATIVE
Ketones, ur: 5 mg/dL — AB
Leukocytes,Ua: NEGATIVE
Nitrite: NEGATIVE
Protein, ur: NEGATIVE mg/dL
Specific Gravity, Urine: 1.023 (ref 1.005–1.030)
pH: 5 (ref 5.0–8.0)

## 2024-02-07 LAB — WET PREP, GENITAL
Clue Cells Wet Prep HPF POC: NONE SEEN
Sperm: NONE SEEN
Trich, Wet Prep: NONE SEEN
WBC, Wet Prep HPF POC: <10 (ref <10)
Yeast Wet Prep HPF POC: NONE SEEN

## 2024-02-07 MED ORDER — CYCLOBENZAPRINE HCL 5 MG PO TABS: 5.0000 mg | ORAL_TABLET | Freq: Three times a day (TID) | ORAL | 0 refills | Status: DC | PRN

## 2024-02-07 MED ORDER — ACETAMINOPHEN 500 MG PO TABS
1000.0000 mg | ORAL_TABLET | Freq: Once | ORAL | Status: AC
Start: 2024-02-08 — End: 2024-02-07
  Administered 2024-02-07: 1000 mg via ORAL
  Filled 2024-02-07: qty 2

## 2024-02-07 MED ORDER — ONDANSETRON HCL 4 MG PO TABS
8.0000 mg | ORAL_TABLET | Freq: Once | ORAL | Status: AC
Start: 2024-02-08 — End: 2024-02-07
  Administered 2024-02-07: 8 mg via ORAL
  Filled 2024-02-07: qty 2

## 2024-02-07 MED ORDER — METRONIDAZOLE 0.75 % VA GEL: 1.0000 | Freq: Every day | VAGINAL | 1 refills | Status: DC

## 2024-02-07 MED ORDER — ACETAMINOPHEN 500 MG PO TABS: 1000.0000 mg | ORAL_TABLET | Freq: Four times a day (QID) | ORAL | Status: AC | PRN

## 2024-02-07 MED ORDER — VALACYCLOVIR HCL 1 G PO TABS: 1000.0000 mg | ORAL_TABLET | Freq: Every day | ORAL | 2 refills | Status: DC

## 2024-02-07 MED ORDER — ONDANSETRON HCL 4 MG PO TABS: 4.0000 mg | ORAL_TABLET | Freq: Every day | ORAL | 1 refills | Status: AC | PRN

## 2024-02-07 NOTE — MAU Note (Signed)
 Pt asked for nausea and pain meds before she goes home. Dr Jomarie aware and placed med orders.

## 2024-02-07 NOTE — MAU Note (Signed)
 Miranda Robertson is a 33 y.o. at [redacted]w[redacted]d here in MAU reporting: abd and back pain mostly on right side, and vaginal discomfort.   Denies burning when urinating, freguency, or bleeding when wiping.   LMP: - Onset of complaint: 1 week Pain score: abd 7/10 back 9/10 Vitals:   02/07/24 1648  BP: 132/83  Pulse: 93  Resp: 14  Temp: 98.4 F (36.9 C)  SpO2: 100%     FHT: 152  Lab orders placed from triage: ua

## 2024-02-07 NOTE — Progress Notes (Signed)
 Written and verbal d/c instructions given and pt voiced understanding. THey left after pt took her medications as the significant other wanted to go to the main ED for a toe issue

## 2024-02-07 NOTE — Discharge Instructions (Signed)
 You came into the Maternity Assessment Unit because you were having pain in the lower right area of your belly. We did an MRI which showed what your appendix looked normal. Your labs also looked okay and not concerning at this time. We will send a medication called flexeril  to your pharmacy. Start with taking one pill at night, as the medication can make you very tired. Please do not drive when taking this medication.

## 2024-02-07 NOTE — MAU Provider Note (Cosign Needed Addendum)
 S [name] is a 33 y.o. G2P2002 pregnant female at [redacted]w[redacted]d who presents to MAU today with complaint of pain that is present in right lower quadrant and radiates around to back. She reports that this pain is relatively constant and has been going on for several days. She reports recent treatment for a kidney stone, and this pain doesn't feel like that. She reports she has had her gallbladder removed.    She reports that she has had some nausea and vomiting over the past few days. She has had runny stool once per day, no constipation. She denies fever or chills. She has not yet started to appreciate fetal movement.   She has not yet initiated [facility]. 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 She is unclear if this could be an outbreak currently but did have a rash a few days to a week ago.   Pertinent items noted in HPI and remainder of comprehensive ROS otherwise negative.   O BP 132/83 (BP Location: Right Arm)   Pulse 93   Temp 98.4 F (36.9 C) (Oral)   Resp 14   Ht 5' 6 (1.676 m)   Wt 95.7 kg   LMP 10/20/2023 (Exact Date)   SpO2 100%   BMI 34.06 kg/m  Physical Exam Vitals reviewed. Exam conducted with a chaperone present.  Constitutional:      Appearance: Normal appearance.  HENT:     Head: Normocephalic.  Cardiovascular:     Rate and Rhythm: Normal rate and regular rhythm.     Pulses: Normal pulses.     Heart sounds: Normal heart sounds.  Pulmonary:     Effort: Pulmonary effort is normal.     Breath sounds: Normal breath sounds.  Abdominal:     General: Bowel sounds are normal.     Tenderness: There is abdominal tenderness in the right lower quadrant. There is guarding. There is no right CVA tenderness or left CVA tenderness.  Genitourinary:    General: Normal vulva.     Exam position: Lithotomy position.      Comments: Patient with herpes outbreak as  indicated above on diagram just posterior and lateral to her clitoris.   Moderate amount of thin Latouche vaginal discharge with a fishy odor noted.  Skin:    General: Skin is warm and dry.     Capillary Refill: Capillary refill takes less than 2 seconds.  Neurological:     General: No focal deficit present.     Mental Status: She is alert and oriented to person, place, and time.  Psychiatric:        Mood and Affect: Mood normal.        Behavior: Behavior normal.      MDM: Moderate Rule out appendicitis due to abdominal pain.  Evaluate herpes infection and evaluate vaginal infection.  MAU Course:  A Supervision of high risk pregnancy, antepartum  GBS bacteriuria  Herpes genitalis in women  [redacted] weeks gestation of pregnancy  Right lower quadrant abdominal pain  Medical screening exam started  P Discharge from MAU in stable condition with routine precautions Follow up at [facility] as scheduled for ongoing prenatal care Treat herpes outbreak with 5 day course of Valtrex  with 2 refills PRN.  Treat BV empirically based on findings with metrogel  70mg  for use at bedtime for 5 nights.  MRI pending at time of sign  out.  Signed out to Dr. Jomarie @ 2126 02/07/24  Camie Rote, MSN, CNM 02/07/2024 9:25 PM  Certified Nurse Midwife, [facility]   10pm Spoke with Dr. Evelyne in Radiology re: MRI, no concern for appendicitis but describes ring-like collection behind the placenta that is concerning for subchorionic hemorrhage, recommending ultrasound for further evaluation  11:15p Ultrasound showing small subchorionic hemorrhage. Discussed with patient. Comfortable with going home at this time, provided flexeril  for further pain relief. Also requested antiemetics, prescribed. Letter written for partner. All questions answered. Follow up with OB scheduled for November 10th.   Charlie DELENA Jomarie, MD OB Fellow, [facility]

## 2024-02-08 ENCOUNTER — Ambulatory Visit: Payer: MEDICAID | Admitting: Dermatology

## 2024-02-08 LAB — GC/CHLAMYDIA PROBE AMP (~~LOC~~) NOT AT ARMC
Chlamydia: NEGATIVE
Comment: NEGATIVE
Comment: NORMAL
Neisseria Gonorrhea: NEGATIVE

## 2024-02-21 DIAGNOSIS — F1991 Other psychoactive substance use, unspecified, in remission: Secondary | ICD-10-CM | POA: Insufficient documentation

## 2024-02-21 DIAGNOSIS — F1210 Cannabis abuse, uncomplicated: Secondary | ICD-10-CM | POA: Insufficient documentation

## 2024-02-21 DIAGNOSIS — Z8759 Personal history of other complications of pregnancy, childbirth and the puerperium: Secondary | ICD-10-CM | POA: Insufficient documentation

## 2024-02-22 ENCOUNTER — Telehealth (INDEPENDENT_AMBULATORY_CARE_PROVIDER_SITE_OTHER): Payer: MEDICAID | Admitting: Psychiatry

## 2024-02-22 ENCOUNTER — Encounter (HOSPITAL_COMMUNITY): Payer: Self-pay | Admitting: Psychiatry

## 2024-02-22 DIAGNOSIS — F319 Bipolar disorder, unspecified: Secondary | ICD-10-CM | POA: Diagnosis not present

## 2024-02-22 DIAGNOSIS — F411 Generalized anxiety disorder: Secondary | ICD-10-CM

## 2024-02-22 MED ORDER — FLUOXETINE HCL 10 MG PO CAPS: 10.0000 mg | ORAL_CAPSULE | Freq: Every day | ORAL | 3 refills | Status: DC

## 2024-02-22 MED ORDER — FLUOXETINE HCL 20 MG PO CAPS: 20.0000 mg | ORAL_CAPSULE | Freq: Every day | ORAL | 3 refills | Status: DC

## 2024-02-22 MED ORDER — LAMOTRIGINE 25 MG PO TABS: 25.0000 mg | ORAL_TABLET | Freq: Every day | ORAL | 1 refills | Status: DC
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
## 2024-02-27 ENCOUNTER — Ambulatory Visit (INDEPENDENT_AMBULATORY_CARE_PROVIDER_SITE_OTHER): Payer: MEDICAID | Admitting: Obstetrics and Gynecology

## 2024-02-27 ENCOUNTER — Other Ambulatory Visit: Payer: Self-pay

## 2024-02-27 VITALS — BP 131/81 | HR 84 | Wt 214.8 lb

## 2024-02-27 DIAGNOSIS — A6009 Herpesviral infection of other urogenital tract: Secondary | ICD-10-CM

## 2024-02-27 DIAGNOSIS — R8271 Bacteriuria: Secondary | ICD-10-CM

## 2024-02-27 DIAGNOSIS — Z8759 Personal history of other complications of pregnancy, childbirth and the puerperium: Secondary | ICD-10-CM

## 2024-02-27 DIAGNOSIS — F3177 Bipolar disorder, in partial remission, most recent episode mixed: Secondary | ICD-10-CM

## 2024-02-27 DIAGNOSIS — F3289 Other specified depressive episodes: Secondary | ICD-10-CM

## 2024-02-27 DIAGNOSIS — Z98891 History of uterine scar from previous surgery: Secondary | ICD-10-CM

## 2024-02-27 DIAGNOSIS — Z8619 Personal history of other infectious and parasitic diseases: Secondary | ICD-10-CM

## 2024-02-27 DIAGNOSIS — O0990 Supervision of high risk pregnancy, unspecified, unspecified trimester: Secondary | ICD-10-CM

## 2024-02-27 DIAGNOSIS — K5900 Constipation, unspecified: Secondary | ICD-10-CM

## 2024-02-27 MED ORDER — DOCUSATE SODIUM 100 MG PO CAPS: 100.0000 mg | ORAL_CAPSULE | Freq: Two times a day (BID) | ORAL | 2 refills | Status: AC | PRN

## 2024-02-27 MED ORDER — ASPIRIN 81 MG PO TBEC: 81.0000 mg | DELAYED_RELEASE_TABLET | Freq: Every day | ORAL | 2 refills | Status: AC

## 2024-02-27 NOTE — Progress Notes (Signed)
 INITIAL PRENATAL VISIT  Subjective:   Miranda Robertson is being seen today for her first obstetrical visit.   She is at [redacted]w[redacted]d gestation by LMP. Her obstetrical history is significant for history of cesarean section x2, history of gestational hypertension. Relationship with FOB: significant other, living together. Patient does intend to breast feed. Pregnancy history fully reviewed.  Patient reports constipation.  Objective:    Obstetric History OB History  Gravida Para Term Preterm AB Living  3 2 2  0 0 2  SAB IAB Ectopic Multiple Live Births  0 0 0 0 2    # Outcome Date GA Lbr Len/2nd Weight Sex Type Anes PTL Lv  3 Current           2 Term 05/21/14 [redacted]w[redacted]d  8 lb 5 oz (3.771 kg) M CS-Vac Spinal  LIV  1 Term 06/09/12 [redacted]w[redacted]d  7 lb 10 oz (3.459 kg) F CS-LTranv Gen, EPI  LIV    Past Medical History:  Diagnosis Date   Allergy    Anemia    Anxiety    Asthma    Childhood   Blood transfusion without reported diagnosis    Depression    Drug use affecting pregnancy in third trimester 04/30/2014   Cannabis use without complication  Last used several months     Gestational hypertension    HSV (herpes simplex virus) infection    Lost custody of children 04/30/2014   DSS has custody of 1st child, states I'm going to delivery in another county, they can't take this one     Mental disorder    bipolar, depression, mild menatl retardation, suicidal thoughts in 2010   Substance abuse (HCC)     Past Surgical History:  Procedure Laterality Date   CESAREAN SECTION  06/09/2012   FTP   CESAREAN SECTION N/A 05/21/2014   Procedure: CESAREAN SECTION;  Surgeon: Glenys GORMAN Birk, MD;  Location: [facility];  Service: Obstetrics;  Laterality: N/A;   CHOLECYSTECTOMY     GALLBLADDER SURGERY  2014   WISDOM TOOTH EXTRACTION      Current Outpatient Medications on File Prior to Visit  Medication Sig Dispense Refill   acetaminophen  (TYLENOL ) 500 MG tablet Take 2 tablets (1,000 mg total) by mouth every 6  (six) hours as needed.     albuterol  (VENTOLIN  HFA) 108 (90 Base) MCG/ACT inhaler Inhale 1-2 puffs into the lungs every 6 (six) hours as needed for wheezing or shortness of breath. 6.7 g 0   chlorhexidine  (PERIDEX ) 0.12 % solution Use as directed 15 mLs in the mouth or throat 2 (two) times daily. 120 mL 0   cyclobenzaprine  (FLEXERIL ) 5 MG tablet Take 1 tablet (5 mg total) by mouth 3 (three) times daily as needed for muscle spasms. 20 tablet 0   FLUoxetine  (PROZAC ) 10 MG capsule Take 1 capsule (10 mg total) by mouth daily. 30 capsule 3   lamoTRIgine  (LAMICTAL ) 25 MG tablet Take 1 tablet (25 mg total) by mouth daily. 90 tablet 1   ondansetron  (ZOFRAN ) 4 MG tablet Take 1 tablet (4 mg total) by mouth daily as needed for nausea or vomiting. 30 tablet 1   polyethylene glycol (MIRALAX ) 17 g packet Take 17 g by mouth 2 (two) times daily. 180 packet 0   Prenatal Vit-Fe Phos-FA-Omega (VITAFOL  GUMMIES) 3.33-0.333-34.8 MG CHEW Chew 3 tablets by mouth daily. 90 tablet 11   metroNIDAZOLE  (METROGEL ) 0.75 % vaginal gel Place 1 Applicatorful vaginally at bedtime. Apply one applicatorful to vagina at bedtime for  5 days 70 g 1   valACYclovir  (VALTREX ) 1000 MG tablet Take 1 tablet (1,000 mg total) by mouth daily. (Patient not taking: Reported on 02/27/2024) 5 tablet 2   No current facility-administered medications on file prior to visit.    No Known Allergies  Social History:  reports that she has never smoked. She has never used smokeless tobacco. She reports that she does not currently use alcohol. She reports that she does not currently use drugs after having used the following drugs: Marijuana.  Family History  Problem Relation Age of Onset   Headache Father    Alcohol abuse Father    Drug abuse Father    Headache Sister    ADD / ADHD Sister    Anxiety disorder Sister    Depression Sister    Obesity Paternal Grandmother    COPD Paternal Aunt     The following portions of the patient's history were  reviewed and updated as appropriate: allergies, current medications, past family history, past medical history, past social history, past surgical history and problem list.  Review of Systems Review of Systems  Gastrointestinal:  Positive for constipation.  All other systems reviewed and are negative.     Physical Exam:  BP 131/81   Pulse 84   Wt 214 lb 12.8 oz (97.4 kg)   LMP 10/20/2023 (Exact Date)   BMI 34.67 kg/m  CONSTITUTIONAL: Well-developed, well-nourished female in no acute distress.  HENT:  Normocephalic, atraumatic.   SKIN: Skin is warm and dry. MUSCULOSKELETAL: Normal range of motion NEUROLOGIC: Alert and oriented  PSYCHIATRIC: Normal mood and affect. Normal behavior.  RESPIRATORY: normal effort ABDOMEN: Soft PELVIC:deferred  Fetal Heart Rate (bpm): 153   Movement: Present       Assessment:    Pregnancy: G3P2002  1. Supervision of high risk pregnancy, antepartum (Primary) BP and FHR normal Doing well overall   - CBC/D/Plt+RPR+Rh+ABO+RubIgG... - Hemoglobin A1c - PANORAMA PRENATAL TEST - HORIZON Basic Panel - AFP, Serum, Open Spina Bifida  2. Bipolar 1 disorder, mixed, partial remission (HCC) 3. 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 GBS bacteriuria Tx in labor   5. Herpes genitalis in women History of hsv, did not discuss at visit, will discussed consecutive visit   6. History of gestational hypertension With second pregnancy, end of pregnancy  Discussed recommendation ASA during pregnancny, rx sent   7. History of cesarean section First in 2014 for NRFHR 2nd in 2016 RCS breech   8. Constipation, unspecified constipation type  - docusate sodium  (COLACE) 100 MG capsule; Take 1 capsule (100 mg total) by mouth 2 (two) times daily as needed.  Dispense: 30 capsule; Refill: 2  9. History of syphilis Addended note: treated with Bicillin  x 3  2025        Plan:     Initial labs drawn. Prenatal vitamins. Problem list reviewed and updated. Reviewed in detail the nature of the practice with collaborative care between  Genetic screening discussed: NIPS/First trimester screen/Quad/AFP ordered. Role of ultrasound in pregnancy discussed; Anatomy US : ordered. Discussed clinic routines, schedule of care and testing, genetic screening options, involvement of students and residents under the direct supervision of APPs and doctors and presence of female providers. Pt verbalized understanding.  Future Appointments  Date Time Provider Department Center  03/06/2024  7:00 AM [facility] PROVIDER 1 [facility]  03/06/2024  7:30 AM [facility] US2 [facility]  04/10/2024  3:00 PM  Harl Zane BRAVO, NP [facility] None    Delores Nidia CROME, FNP

## 2024-02-29 ENCOUNTER — Encounter: Payer: Self-pay | Admitting: Obstetrics and Gynecology

## 2024-02-29 LAB — CBC/D/PLT+RPR+RH+ABO+RUBIGG...
Antibody Screen: NEGATIVE
Basophils Absolute: 0 x10E3/uL (ref 0.0–0.2)
Basos: 0 %
EOS (ABSOLUTE): 0.1 x10E3/uL (ref 0.0–0.4)
Eos: 0 %
HCV Ab: NONREACTIVE
HIV Screen 4th Generation wRfx: NONREACTIVE
Hematocrit: 34.7 % (ref 34.0–46.6)
Hemoglobin: 11.7 g/dL (ref 11.1–15.9)
Hepatitis B Surface Ag: NEGATIVE
Immature Grans (Abs): 0.1 x10E3/uL (ref 0.0–0.1)
Immature Granulocytes: 1 %
Lymphocytes Absolute: 1.9 x10E3/uL (ref 0.7–3.1)
Lymphs: 15 %
MCH: 29.7 pg (ref 26.6–33.0)
MCHC: 33.7 g/dL (ref 31.5–35.7)
MCV: 88 fL (ref 79–97)
Monocytes Absolute: 0.7 x10E3/uL (ref 0.1–0.9)
Monocytes: 5 %
Neutrophils Absolute: 9.6 x10E3/uL — ABNORMAL HIGH (ref 1.4–7.0)
Neutrophils: 79 %
Platelets: 327 x10E3/uL (ref 150–450)
RBC: 3.94 x10E6/uL (ref 3.77–5.28)
RDW: 12.7 % (ref 11.7–15.4)
RPR Ser Ql: REACTIVE — AB
Rh Factor: POSITIVE
Rubella Antibodies, IGG: 2.57 {index} (ref >0.99)
WBC: 12.2 x10E3/uL — ABNORMAL HIGH (ref 3.4–10.8)

## 2024-02-29 LAB — AFP, SERUM, OPEN SPINA BIFIDA
AFP MoM: 0.82
AFP Value: 28.2 ng/mL
Gest. Age on Collection Date: 18 wk
Maternal Age At EDD: 31.5 a
OSBR Risk 1 IN: 10000
Test Results:: NEGATIVE
Weight: 215 [lb_av]

## 2024-02-29 LAB — RPR, QUANT+TP ABS (REFLEX)
Rapid Plasma Reagin, Quant: 1:1 {titer} — ABNORMAL HIGH
T Pallidum Abs: REACTIVE — AB

## 2024-02-29 LAB — HCV INTERPRETATION

## 2024-02-29 LAB — HEMOGLOBIN A1C
Est. average glucose Bld gHb Est-mCnc: 91 mg/dL
Hgb A1c MFr Bld: 4.8 % (ref 4.8–5.6)

## 2024-03-02 ENCOUNTER — Ambulatory Visit: Payer: Self-pay | Admitting: Obstetrics and Gynecology

## 2024-03-02 ENCOUNTER — Telehealth: Payer: Self-pay

## 2024-03-02 DIAGNOSIS — O0990 Supervision of high risk pregnancy, unspecified, unspecified trimester: Secondary | ICD-10-CM

## 2024-03-02 NOTE — Telephone Encounter (Signed)
 Left voicemail stating a letter was sent to her MyChart.   Waddell, RN

## 2024-03-02 NOTE — Telephone Encounter (Signed)
 Pt left voicemail stating she has a dentist appointment on Monday and her dentist is requesting a letter due to her being pregnant.   Waddell, RN

## 2024-03-04 LAB — PANORAMA PRENATAL TEST FULL PANEL:PANORAMA TEST PLUS 5 ADDITIONAL MICRODELETIONS: FETAL FRACTION: 11.6

## 2024-03-05 ENCOUNTER — Telehealth: Payer: Self-pay | Admitting: Obstetrics and Gynecology

## 2024-03-05 ENCOUNTER — Encounter: Payer: Self-pay | Admitting: Obstetrics and Gynecology

## 2024-03-05 NOTE — Telephone Encounter (Signed)
 Called patient to discuss h/o syphlis and current testing, she states she would prefer to have mychart message sent.   LOIS Yolanda Moats, MD, [facility] Attending Center for [employer] [facility])

## 2024-03-06 ENCOUNTER — Ambulatory Visit: Payer: MEDICAID | Attending: Obstetrics and Gynecology

## 2024-03-06 ENCOUNTER — Ambulatory Visit (HOSPITAL_BASED_OUTPATIENT_CLINIC_OR_DEPARTMENT_OTHER): Payer: MEDICAID | Admitting: Maternal & Fetal Medicine

## 2024-03-06 VITALS — BP 126/72

## 2024-03-06 DIAGNOSIS — O09293 Supervision of pregnancy with other poor reproductive or obstetric history, third trimester: Secondary | ICD-10-CM

## 2024-03-06 DIAGNOSIS — O09292 Supervision of pregnancy with other poor reproductive or obstetric history, second trimester: Secondary | ICD-10-CM | POA: Diagnosis not present

## 2024-03-06 DIAGNOSIS — Z98891 History of uterine scar from previous surgery: Secondary | ICD-10-CM | POA: Diagnosis not present

## 2024-03-06 DIAGNOSIS — Z8759 Personal history of other complications of pregnancy, childbirth and the puerperium: Secondary | ICD-10-CM | POA: Insufficient documentation

## 2024-03-06 DIAGNOSIS — Z3A19 19 weeks gestation of pregnancy: Secondary | ICD-10-CM

## 2024-03-06 DIAGNOSIS — O99343 Other mental disorders complicating pregnancy, third trimester: Secondary | ICD-10-CM

## 2024-03-06 DIAGNOSIS — O0990 Supervision of high risk pregnancy, unspecified, unspecified trimester: Secondary | ICD-10-CM | POA: Insufficient documentation

## 2024-03-06 DIAGNOSIS — O34219 Maternal care for unspecified type scar from previous cesarean delivery: Secondary | ICD-10-CM | POA: Diagnosis not present

## 2024-03-06 DIAGNOSIS — O99342 Other mental disorders complicating pregnancy, second trimester: Secondary | ICD-10-CM

## 2024-03-06 DIAGNOSIS — O99212 Obesity complicating pregnancy, second trimester: Secondary | ICD-10-CM

## 2024-03-06 DIAGNOSIS — R8271 Bacteriuria: Secondary | ICD-10-CM

## 2024-03-06 DIAGNOSIS — F1991 Other psychoactive substance use, unspecified, in remission: Secondary | ICD-10-CM | POA: Insufficient documentation

## 2024-03-06 DIAGNOSIS — E669 Obesity, unspecified: Secondary | ICD-10-CM

## 2024-03-06 DIAGNOSIS — F319 Bipolar disorder, unspecified: Secondary | ICD-10-CM

## 2024-03-06 LAB — HORIZON CUSTOM: REPORT SUMMARY: NEGATIVE

## 2024-03-06 NOTE — Progress Notes (Addendum)
 MFM consultation  Miranda Robertson is a 33 yo G3P2 at 37 w 5 d with an EDD of 07/26/24.  She is seen at the request of Evalene Arts, NP for the following issues:   1) Bipolar d/o Currently on Lamictal  and stable no episodes of hospitalizations or mania.  2) H/o GHTN Uncertain regarding this diagnosis.  3) C/S x2 For NRHFT and R/S plans for repeat.  4) H/o  of Syphillis treated in 06/2023 with pen G x 3      03/06/2024    7:13 AM 02/27/2024    3:23 PM 02/07/2024   11:53 PM  Vitals with BMI  Weight  214 lbs 13 oz   BMI  34.69   Systolic 126 131 877  Diastolic 72 81 74  Pulse  84 77      Latest Ref Rng & Units 02/27/2024    4:03 PM 02/07/2024    7:21 PM 12/19/2023   10:53 PM  CBC  WBC 3.4 - 10.8 x10E3/uL 12.2  12.6  13.3   Hemoglobin 11.1 - 15.9 g/dL 88.2  88.5  86.8   Hematocrit 34.0 - 46.6 % 34.7  33.8  39.0   Platelets 150 - 450 x10E3/uL 327  326  337       Latest Ref Rng & Units 02/07/2024    7:21 PM 12/19/2023   10:53 PM 09/20/2023   11:07 AM  CMP  Glucose 70 - 99 mg/dL 87  897  94   BUN 6 - 20 mg/dL 6  <5  8   Creatinine 9.55 - 1.00 mg/dL 9.48  9.48  9.36   Sodium 135 - 145 mmol/L 135  135  139   Potassium 3.5 - 5.1 mmol/L 3.7  4.0  3.9   Chloride 98 - 111 mmol/L 103  105  104   CO2 22 - 32 mmol/L 23  21  20    Calcium 8.9 - 10.3 mg/dL 9.1  9.3  9.2   Total Protein 6.5 - 8.1 g/dL 6.6  6.9    Total Bilirubin 0.0 - 1.2 mg/dL 0.5  0.5    Alkaline Phos 38 - 126 U/L 59  58    AST 15 - 41 U/L 14  15    ALT 0 - 44 U/L 12  13     Past Medical History:  Diagnosis Date   Allergy    Anemia    Anxiety    Asthma    Childhood   Blood transfusion without reported diagnosis    Depression    Drug use affecting pregnancy in third trimester 04/30/2014   Cannabis use without complication  Last used several months     Gestational hypertension    HSV (herpes simplex virus) infection    Lost custody of children 04/30/2014   DSS has custody of 1st child, states I'm going to  delivery in another county, they can't take this one     Mental disorder    bipolar, depression, mild menatl retardation, suicidal thoughts in 2010   Substance abuse (HCC)    Past Surgical History:  Procedure Laterality Date   CESAREAN SECTION  06/09/2012   FTP   CESAREAN SECTION N/A 05/21/2014   Procedure: CESAREAN SECTION;  Surgeon: Glenys GORMAN Birk, MD;  Location: [facility];  Service: Obstetrics;  Laterality: N/A;   CHOLECYSTECTOMY     GALLBLADDER SURGERY  2014   WISDOM TOOTH EXTRACTION     Family History  Problem Relation Age of Onset  Headache Father    Alcohol abuse Father    Drug abuse Father    Headache Sister    ADD / ADHD Sister    Anxiety disorder Sister    Depression Sister    Obesity Paternal Grandmother    COPD Paternal Aunt    Social History   Socioeconomic History   Marital status: Single    Spouse name: Not on file   Number of children: Not on file   Years of education: Not on file   Highest education level: 12th grade  Occupational History   Not on file  Tobacco Use   Smoking status: Never   Smokeless tobacco: Never  Vaping Use   Vaping status: Never Used  Substance and Sexual Activity   Alcohol use: Not Currently   Drug use: Not Currently    Types: Marijuana    Comment: last use  Mar 03 2024   Sexual activity: Yes    Birth control/protection: Condom    Comment: occasional condom use  Other Topics Concern   Not on file  Social History Narrative   Right handed   Caffeine intake 8 cups a week   Social Drivers of Health   Financial Resource Strain: Medium Risk (12/01/2022)   Overall Financial Resource Strain (CARDIA)    Difficulty of Paying Living Expenses: Somewhat hard  Food Insecurity: Food Insecurity Present (12/01/2022)   Hunger Vital Sign    Worried About Running Out of Food in the Last Year: Sometimes true    Ran Out of Food in the Last Year: Sometimes true  Transportation Needs: No Transportation Needs (12/01/2022)   PRAPARE -  Administrator, Civil Service (Medical): No    Lack of Transportation (Non-Medical): No  Physical Activity: Sufficiently Active (12/01/2022)   Exercise Vital Sign    Days of Exercise per Week: 6 days    Minutes of Exercise per Session: 60 min  Stress: Stress Concern Present (12/01/2022)   Miranda Robertson    Feeling of Stress : To some extent  Social Connections: Moderately Integrated (12/01/2022)   Social Connection and Isolation Panel    Frequency of Communication with Friends and Family: More than three times a week    Frequency of Social Gatherings with Friends and Family: Once a week    Attends Religious Services: 1 to 4 times per year    Active Member of Miranda Robertson: Yes    Attends Miranda Robertson, Miranda Robertson: More than 4 times per year    Marital Status: Never married  Catering Manager Violence: Not on file   No Known Allergies    Current Outpatient Medications (Respiratory):    albuterol  (VENTOLIN  HFA) 108 (90 Base) MCG/ACT inhaler, Inhale 1-2 puffs into the lungs every 6 (six) hours as needed for wheezing or shortness of breath.  Current Outpatient Medications (Analgesics):    acetaminophen  (TYLENOL ) 500 MG tablet, Take 2 tablets (1,000 mg total) by mouth every 6 (six) hours as needed.   aspirin EC 81 MG tablet, Take 1 tablet (81 mg total) by mouth daily. Start taking when you are [redacted] weeks pregnant for rest of pregnancy for prevention of preeclampsia   Current Outpatient Medications (Other):    chlorhexidine  (PERIDEX ) 0.12 % solution, Use as directed 15 mLs in the mouth or throat 2 (two) times daily.   cyclobenzaprine  (FLEXERIL ) 5 MG tablet, Take 1 tablet (5 mg total) by mouth 3 (three) times daily as needed for  muscle spasms.   docusate sodium  (COLACE) 100 MG capsule, Take 1 capsule (100 mg total) by mouth 2 (two) times daily as needed.   FLUoxetine  (PROZAC ) 10 MG capsule, Take 1 capsule (10  mg total) by mouth daily.   lamoTRIgine  (LAMICTAL ) 25 MG tablet, Take 1 tablet (25 mg total) by mouth daily.   ondansetron  (ZOFRAN ) 4 MG tablet, Take 1 tablet (4 mg total) by mouth daily as needed for nausea or vomiting.   polyethylene glycol (MIRALAX ) 17 g packet, Take 17 g by mouth 2 (two) times daily.   Prenatal Vit-Fe Phos-FA-Omega (VITAFOL  GUMMIES) 3.33-0.333-34.8 MG CHEW, Chew 3 tablets by mouth daily.   valACYclovir  (VALTREX ) 1000 MG tablet, Take 1 tablet (1,000 mg total) by mouth daily. (Patient not taking: Reported on 03/06/2024)  Imaging: Single intrauterine pregnancy here for a detailed anatomy  Normal anatomy with measurements consistent with dates There is good fetal movement and amniotic fluid volume Suboptimal views of the fetal maxilla was observed due to fetal position and maternal habitus.   Impression/Counseling:  1) Bipolar D/O on Lamictal  I discussed with Ms. Portlock that Lamictal  is associated with oral clefts however, we observed a normal lip and palate.. We discussed the increased association for therapeutic variation give increased plasma volume as such we recommend that therapeutic levels be evaluated every 4-6 weeks throughout pregnancy  She is seeing integrated behavioral health for management of her bipolar d/o   2) H/o of GHTN She is taking LD ASA for preeclampsia prevention it is uncertain how this presented in her prior pregnancy. Ms. Decock was unaware of this diagnosis  We scheduled a follow up growth at 32 weeks.   3) H/o Syphillis Treated this year. RPR 1:1 TPA positive recently and > 8months ago.  4) h/o C/S  Plans for repeat C/S  I spent 45 minutes with > 50% in face to face consultation  Nathanel DOROTHA Fetters, MD

## 2024-03-07 ENCOUNTER — Other Ambulatory Visit: Payer: Self-pay | Admitting: *Deleted

## 2024-03-07 DIAGNOSIS — F3177 Bipolar disorder, in partial remission, most recent episode mixed: Secondary | ICD-10-CM

## 2024-03-07 DIAGNOSIS — O99212 Obesity complicating pregnancy, second trimester: Secondary | ICD-10-CM

## 2024-03-27 ENCOUNTER — Encounter: Payer: Self-pay | Admitting: Obstetrics and Gynecology

## 2024-03-27 ENCOUNTER — Other Ambulatory Visit: Payer: Self-pay

## 2024-03-27 ENCOUNTER — Ambulatory Visit: Payer: MEDICAID | Admitting: Obstetrics and Gynecology

## 2024-03-27 VITALS — BP 126/83 | HR 75 | Wt 216.7 lb

## 2024-03-27 DIAGNOSIS — Z8759 Personal history of other complications of pregnancy, childbirth and the puerperium: Secondary | ICD-10-CM

## 2024-03-27 DIAGNOSIS — O0992 Supervision of high risk pregnancy, unspecified, second trimester: Secondary | ICD-10-CM | POA: Diagnosis not present

## 2024-03-27 DIAGNOSIS — Z3A22 22 weeks gestation of pregnancy: Secondary | ICD-10-CM | POA: Diagnosis not present

## 2024-03-27 DIAGNOSIS — B009 Herpesviral infection, unspecified: Secondary | ICD-10-CM | POA: Diagnosis not present

## 2024-03-27 DIAGNOSIS — R8271 Bacteriuria: Secondary | ICD-10-CM

## 2024-03-27 DIAGNOSIS — Z8619 Personal history of other infectious and parasitic diseases: Secondary | ICD-10-CM

## 2024-03-27 DIAGNOSIS — O0990 Supervision of high risk pregnancy, unspecified, unspecified trimester: Secondary | ICD-10-CM

## 2024-03-27 DIAGNOSIS — Z98891 History of uterine scar from previous surgery: Secondary | ICD-10-CM | POA: Diagnosis not present

## 2024-03-27 DIAGNOSIS — F1991 Other psychoactive substance use, unspecified, in remission: Secondary | ICD-10-CM

## 2024-03-27 DIAGNOSIS — O468X2 Other antepartum hemorrhage, second trimester: Secondary | ICD-10-CM

## 2024-03-27 DIAGNOSIS — F3177 Bipolar disorder, in partial remission, most recent episode mixed: Secondary | ICD-10-CM | POA: Diagnosis not present

## 2024-03-27 LAB — POCT URINALYSIS DIP (DEVICE)
Bilirubin Urine: NEGATIVE
Glucose, UA: NEGATIVE mg/dL
Hgb urine dipstick: NEGATIVE
Ketones, ur: NEGATIVE mg/dL
Leukocytes,Ua: NEGATIVE
Nitrite: NEGATIVE
Protein, ur: NEGATIVE mg/dL
Specific Gravity, Urine: >=1.03 (ref 1.005–1.030)
Urobilinogen, UA: 0.2 mg/dL (ref 0.0–1.0)
pH: 5.5 (ref 5.0–8.0)

## 2024-03-27 MED ORDER — FAMOTIDINE 20 MG PO TABS: 20.0000 mg | ORAL_TABLET | Freq: Every day | ORAL | 3 refills | Status: DC

## 2024-03-27 MED ORDER — BLOOD PRESSURE KIT DEVI: 1.0000 | Freq: Once | 0 refills | Status: AC

## 2024-03-27 MED ORDER — PROMETHAZINE HCL 25 MG PO TABS: 25.0000 mg | ORAL_TABLET | Freq: Every evening | ORAL | 2 refills | Status: AC

## 2024-03-27 NOTE — Patient Instructions (Signed)
 [facility] [address], [place], [place] [address] [phone] Hours: Sunday Closed Monday 9AM-6PM Tuesday 9AM-6PM Wednesday 9AM-6PM Thursday 9AM-6PM Friday           9AM-6PM Saturday         10 AM-1PM

## 2024-03-27 NOTE — Progress Notes (Signed)
 PRENATAL VISIT NOTE  Subjective:  Miranda Robertson is a 33 y.o. G3P2002 at [redacted]w[redacted]d being seen today for ongoing prenatal care.  She is currently monitored for the following issues for this high-risk pregnancy and has HSV-2 (herpes simplex virus 2) infection; History of cesarean section x 2; Attention deficit hyperactivity disorder (ADHD), predominantly inattentive type; Bipolar 1 disorder, mixed, partial remission (HCC); Depressive disorder, atypical; PTSD (post-traumatic stress disorder); History of syphilis; Supervision of high risk pregnancy, antepartum; GBS bacteriuria; Subchorionic hemorrhage in second trimester; History of gestational hypertension; and History of illicit drug use on their problem list.  Patient reports nausea, heartburn, feeling dizzy. .  Contractions: Not present. Vag. Bleeding: None.  Movement: Present. Denies leaking of fluid.   The following portions of the patient's history were reviewed and updated as appropriate: allergies, current medications, past family history, past medical history, past social history, past surgical history and problem list.   Objective:   Vitals:   03/27/24 1203  BP: 126/83  Pulse: 75  Weight: 216 lb 11.2 oz (98.3 kg)    Fetal Status:  Fetal Heart Rate (bpm): 160   Movement: Present    General: Alert, oriented and cooperative. Patient is in no acute distress.  Skin: Skin is warm and dry. No rash noted.   Cardiovascular: Normal heart rate noted  Respiratory: Normal respiratory effort, no problems with respiration noted  Abdomen: Soft, gravid, appropriate for gestational age.  Pain/Pressure: Present     Pelvic: Cervical exam deferred        Extremities: Normal range of motion.  Edema: None  Mental Status: Normal mood and affect. Normal behavior. Normal judgment and thought content.      02/27/2024    3:46 PM 02/22/2024    3:08 PM 12/12/2023    8:46 AM  Depression screen PHQ 2/9  Decreased Interest 2  1  Down, Depressed, Hopeless 2  1   PHQ - 2 Score 4  2  Altered sleeping 1  0  Tired, decreased energy 2  0  Change in appetite 1  0  Feeling bad or failure about yourself  2  1  Trouble concentrating 2  0  Moving slowly or fidgety/restless 2  0  Suicidal thoughts 0  0  PHQ-9 Score 14  3   Difficult doing work/chores        Information is confidential and restricted. Go to Review Flowsheets to unlock data.   Data saved with a previous flowsheet row definition        02/22/2024    3:10 PM 12/12/2023    8:47 AM 09/15/2023    1:46 PM 07/22/2023    8:12 AM  GAD 7 : Generalized Anxiety Score  Nervous, Anxious, on Edge  1    Control/stop worrying  1    Worry too much - different things  1    Trouble relaxing  1    Restless  1    Easily annoyed or irritable  1    Afraid - awful might happen  0    Total GAD 7 Score  6    Anxiety Difficulty         Information is confidential and restricted. Go to Review Flowsheets to unlock data.    Assessment and Plan:  Pregnancy: G3P2002 at [redacted]w[redacted]d  1. Supervision of high risk pregnancy, antepartum (Primary) - Blood Pressure Monitoring (BLOOD PRESSURE KIT) DEVI; 1 Device by Does not apply route once for 1 dose.  Dispense: 1 each; Refill: 0  2. History of cesarean section x 2 Pt interested in vaginal delivery Reviewed risks/benefits of TOLAC versus RCS in detail. Patient counseled regarding potential vaginal delivery, chance of success, future implications, possible uterine rupture and need for urgent/emergent repeat cesarean. Counseled regarding potential need for repeat c-section for reasons unrelated to first c-section. Counseled regarding scheduled repeat cesarean including risks of bleeding, infection, damage to surrounding tissue, abnormal placentation, implications for future pregnancies. All questions answered.  Patient will consider.  3. HSV-2 (herpes simplex virus 2) infection Needs ppx 34 weeks  4. History of syphilis S/p txt bicillin  x 3 doses 04/2023 Stable titer  5.  GBS bacteriuria  6. History of gestational hypertension  7. History of illicit drug use Will consider [facility] clinic  8. Subchorionic hemorrhage in second trimester  9. [redacted] weeks gestation of pregnancy  10. Bipolar 1 disorder, mixed, partial remission (HCC) Stopped taking lamictal  at anatomy She feels like she is doing okay Reviewed risks/benefits of continuing, particularly for mental health and mom being healthy She has appt with psychiatrist coming up and will discuss restarting then   Preterm labor symptoms and general obstetric precautions including but not limited to vaginal bleeding, contractions, leaking of fluid and fetal movement were reviewed in detail with the patient. Please refer to After Visit Summary for other counseling recommendations.   Return in about 1 month (around 04/27/2024) for high OB, 3rd trim labs.  Future Appointments  Date Time Provider Department Center  04/10/2024  3:00 PM Harl Zane BRAVO, NP [facility] None  04/27/2024  8:15 AM Eldonna Suzen Octave, MD [facility]  04/27/2024  9:00 AM [facility]  06/05/2024  8:15 AM [facility] PROVIDER 1 [facility]  06/05/2024  8:30 AM [facility] US2 [facility]    Burnard CHRISTELLA Moats, MD

## 2024-04-10 ENCOUNTER — Telehealth (HOSPITAL_COMMUNITY): Payer: MEDICAID | Admitting: Psychiatry

## 2024-04-10 ENCOUNTER — Encounter (HOSPITAL_COMMUNITY): Payer: Self-pay | Admitting: Psychiatry

## 2024-04-10 DIAGNOSIS — F411 Generalized anxiety disorder: Secondary | ICD-10-CM | POA: Diagnosis not present

## 2024-04-10 DIAGNOSIS — F319 Bipolar disorder, unspecified: Secondary | ICD-10-CM | POA: Diagnosis not present

## 2024-04-10 MED ORDER — FLUOXETINE HCL 10 MG PO CAPS: 10.0000 mg | ORAL_CAPSULE | Freq: Every day | ORAL | 3 refills | Status: DC

## 2024-04-10 MED ORDER — LAMOTRIGINE 25 MG PO TABS: 25.0000 mg | ORAL_TABLET | Freq: Every day | ORAL | 1 refills | Status: DC

## 2024-04-10 NOTE — Progress Notes (Signed)
 [facility] MD/PA/NP OP Progress Note Virtual Visit via Video Note  I connected with Miranda Robertson on 04/10/2024 at  3:00 PM EST by a video enabled telemedicine application and verified that I am speaking with the correct person using two identifiers.  Location: Patient: Home Provider: Clinic   I discussed the limitations of evaluation and management by telemedicine and the availability of in person appointments. The patient expressed understanding and agreed to proceed.  I provided 30 minutes of non-face-to-face time during this encounter.        04/10/2024 9:12 AM Miranda Robertson  MRN:  [id number]  Chief Complaint: I stopped Lamictal   HPI: 33 year old female seen today for follow-up psychiatric evaluation.  She has a psychiatric history of ADHD, bipolar disorder 1, PTSD and depression.  She is currently managed on Prozac  10 mg daily and Lamictal  75 mg daily.   Today she was well-groomed, pleasant, cooperative, and engaged in conversation.  She informed clinical research associate that she stopped Lamictal  after being told that her son Octavio Raddle.) may have a  cleft lip. Patient later went on to say that her sons lip was unable to be viewed on the ultrasound. Provider informed patient that Lamictal  is generally safe in pregnancy but has a small percentage of causing cleft lip. She endorsed understanding and noted that she may want to restart it. Patient notes that she continues to take Prozac . In the last few weeks she informed writer that her mood has been more stable and notes that her depression/anxiety has improved.  Today provider conducted a GAD 7 and patient scored a 12, at her last visit she scored a  21.  Provider also conducted PHQ-9 and patient scored a 11, at her last visit she scored a 14.  Today she denies SI/HI/AVH or paranoia.    Today patient  agreeable to restarting Lamictal  25 mg for two weeks. She will then increase to 50 mg for two weeks and then 75 mg. Patient also wants to continue Prozac  as  prescribed.  No other concerns at this time.       Visit Diagnosis:    ICD-10-CM   1. Bipolar I disorder (HCC)  F31.9 lamoTRIgine  (LAMICTAL ) 25 MG tablet    2. Generalized anxiety disorder  F41.1 FLUoxetine  (PROZAC ) 10 MG capsule         Past Psychiatric History: Bipolar disorder 1, PTSD and depression   Past Medical History:  Past Medical History:  Diagnosis Date   Allergy    Anemia    Anxiety    Asthma    Childhood   Blood transfusion without reported diagnosis    Depression    Drug use affecting pregnancy in third trimester 04/30/2014   Cannabis use without complication  Last used several months     Gestational hypertension    HSV (herpes simplex virus) infection    Lost custody of children 04/30/2014   DSS has custody of 1st child, states I'm going to delivery in another county, they can't take this one     Mental disorder    bipolar, depression, mild menatl retardation, suicidal thoughts in 2010   Substance abuse (HCC)     Past Surgical History:  Procedure Laterality Date   CESAREAN SECTION  06/09/2012   FTP   CESAREAN SECTION N/A 05/21/2014   Procedure: CESAREAN SECTION;  Surgeon: Glenys GORMAN Birk, MD;  Location: [facility];  Service: Obstetrics;  Laterality: N/A;   CHOLECYSTECTOMY     GALLBLADDER SURGERY  2014   WISDOM TOOTH  EXTRACTION      Family Psychiatric History: Depression mother, father, paternal grandmother, paternal aunts, paternal consins  Family History:  Family History  Problem Relation Age of Onset   Headache Father    Alcohol abuse Father    Drug abuse Father    Headache Sister    ADD / ADHD Sister    Anxiety disorder Sister    Depression Sister    Obesity Paternal Grandmother    COPD Paternal Aunt     Social History:  Social History   Socioeconomic History   Marital status: Single    Spouse name: Not on file   Number of children: Not on file   Years of education: Not on file   Highest education level: 12th grade   Occupational History   Not on file  Tobacco Use   Smoking status: Never   Smokeless tobacco: Never  Vaping Use   Vaping status: Never Used  Substance and Sexual Activity   Alcohol use: Not Currently   Drug use: Not Currently    Types: Marijuana    Comment: last use  Mar 03 2024   Sexual activity: Yes    Birth control/protection: Condom    Comment: occasional condom use  Other Topics Concern   Not on file  Social History Narrative   Right handed   Caffeine intake 8 cups a week   Social Drivers of Health   Tobacco Use: Low Risk (04/10/2024)   Patient History    Smoking Tobacco Use: Never    Smokeless Tobacco Use: Never    Passive Exposure: Not on file  Financial Resource Strain: Medium Risk (12/01/2022)   Overall Financial Resource Strain (CARDIA)    Difficulty of Paying Living Expenses: Somewhat hard  Food Insecurity: Food Insecurity Present (12/01/2022)   Hunger Vital Sign    Worried About Running Out of Food in the Last Year: Sometimes true    Ran Out of Food in the Last Year: Sometimes true  Transportation Needs: No Transportation Needs (12/01/2022)   PRAPARE - Administrator, Civil Service (Medical): No    Lack of Transportation (Non-Medical): No  Physical Activity: Sufficiently Active (12/01/2022)   Exercise Vital Sign    Days of Exercise per Week: 6 days    Minutes of Exercise per Session: 60 min  Stress: Stress Concern Present (12/01/2022)   [employer] of Occupational Health - Occupational Stress Questionnaire    Feeling of Stress : To some extent  Social Connections: Moderately Integrated (12/01/2022)   Social Connection and Isolation Panel    Frequency of Communication with Friends and Family: More than three times a week    Frequency of Social Gatherings with Friends and Family: Once a week    Attends Religious Services: 1 to 4 times per year    Active Member of Clubs or Organizations: Yes    Attends Banker Meetings: More than 4  times per year    Marital Status: Never married  Depression (PHQ2-9): High Risk (04/10/2024)   Depression (PHQ2-9)    PHQ-2 Score: 11  Alcohol Screen: Low Risk (12/01/2022)   Alcohol Screen    Last Alcohol Screening Score (AUDIT): 2  Housing: High Risk (12/01/2022)   Housing    Last Housing Risk Score: 98  Utilities: Not on file  Health Literacy: Not on file    Allergies: No Known Allergies  Metabolic Disorder Labs: Lab Results  Component Value Date   HGBA1C 4.8 02/27/2024   MPG 111  01/26/2007   No results found for: PROLACTIN Lab Results  Component Value Date   CHOL 187 12/09/2017   TRIG 201 (H) 12/09/2017   HDL 53 12/09/2017   CHOLHDL 3.5 12/09/2017   VLDL 57 (H) 01/26/2007   LDLCALC 94 12/09/2017   LDLCALC 80 07/23/2016   Lab Results  Component Value Date   TSH 0.979 12/01/2022   TSH 0.920 04/13/2022    Therapeutic Level Labs: No results found for: LITHIUM  No results found for: VALPROATE No results found for: CBMZ  Current Medications: Current Outpatient Medications  Medication Sig Dispense Refill   acetaminophen  (TYLENOL ) 500 MG tablet Take 2 tablets (1,000 mg total) by mouth every 6 (six) hours as needed.     albuterol  (VENTOLIN  HFA) 108 (90 Base) MCG/ACT inhaler Inhale 1-2 puffs into the lungs every 6 (six) hours as needed for wheezing or shortness of breath. 6.7 g 0   aspirin  EC 81 MG tablet Take 1 tablet (81 mg total) by mouth daily. Start taking when you are [redacted] weeks pregnant for rest of pregnancy for prevention of preeclampsia 300 tablet 2   chlorhexidine  (PERIDEX ) 0.12 % solution Use as directed 15 mLs in the mouth or throat 2 (two) times daily. (Patient not taking: Reported on 03/27/2024) 120 mL 0   cyclobenzaprine  (FLEXERIL ) 5 MG tablet Take 1 tablet (5 mg total) by mouth 3 (three) times daily as needed for muscle spasms. (Patient not taking: Reported on 03/27/2024) 20 tablet 0   docusate sodium  (COLACE) 100 MG capsule Take 1 capsule (100 mg  total) by mouth 2 (two) times daily as needed. 30 capsule 2   famotidine  (PEPCID ) 20 MG tablet Take 1 tablet (20 mg total) by mouth daily. 60 tablet 3   FLUoxetine  (PROZAC ) 10 MG capsule Take 1 capsule (10 mg total) by mouth daily. 30 capsule 3   lamoTRIgine  (LAMICTAL ) 25 MG tablet Take 1 tablet (25 mg total) by mouth daily. 90 tablet 1   ondansetron  (ZOFRAN ) 4 MG tablet Take 1 tablet (4 mg total) by mouth daily as needed for nausea or vomiting. 30 tablet 1   Prenatal Vit-Fe Phos-FA-Omega (VITAFOL  GUMMIES) 3.33-0.333-34.8 MG CHEW Chew 3 tablets by mouth daily. 90 tablet 11   promethazine  (PHENERGAN ) 25 MG tablet Take 1 tablet (25 mg total) by mouth at bedtime. 30 tablet 2   valACYclovir  (VALTREX ) 1000 MG tablet Take 1 tablet (1,000 mg total) by mouth daily. (Patient not taking: Reported on 03/27/2024) 5 tablet 2   No current facility-administered medications for this visit.     Musculoskeletal: Strength & Muscle Tone: within normal limits and Telehealth visit Gait & Station: normal, Telehealth visit Patient leans: N/A  Psychiatric Specialty Exam: Review of Systems  Last menstrual period 10/20/2023.There is no height or weight on file to calculate BMI.  General Appearance: Well Groomed  Eye Contact:  Good  Speech:  Clear and Coherent and Normal Rate  Volume:  Normal  Mood:  Anxious and Depressed, improving  Affect:  Appropriate and Congruent  Thought Process:  Coherent, Goal Directed and Linear  Orientation:  Full (Time, Place, and Person)  Thought Content: WDL and Logical   Suicidal Thoughts:  No  Homicidal Thoughts:  No  Memory:  Immediate;   Good Recent;   Good Remote;   Good  Judgement:  Good  Insight:  Good  Psychomotor Activity:  Normal  Concentration:  Concentration: Fair and Attention Span: Fair  Recall:  Good  Fund of Knowledge: Good  Language: Good  Akathisia:  No  Handed:  Right  AIMS (if indicated):Not done  Assets:  Communication Skills Desire for  Improvement Financial Resources/Insurance Housing Intimacy Social Support  ADL's:  Intact  Cognition: WNL  Sleep:  Good   Screenings: AUDIT    Flowsheet Row Clinical Support from 02/21/2020 in [facility]  Alcohol Use Disorder Identification Test Final Score (AUDIT) 15   GAD-7    Flowsheet Row Video Visit from 04/10/2024 in [facility] Video Visit from 02/22/2024 in [facility] Clinical Support from 12/12/2023 in [facility] for [facility] [place] Clinical Support from 09/15/2023 in [facility] Video Visit from 07/22/2023 in [facility]  Total GAD-7 Score 12 21 6 15 19    PHQ2-9    Flowsheet Row Video Visit from 04/10/2024 in [facility] Initial Prenatal from [facility]/27/20[facility] in [facility] Video Visit from 02/22/2024 in [facility] Clinical Support from 12/12/2023 in [facility] for [facility] [place] Clinical Support from 09/15/2023 in [facility]  PHQ-2 Total Score 3 4 5 2 4   PHQ-9 Total Score 11 14 14 3 17    Flowsheet Row Video Visit from 04/10/2024 in [facility] Video Visit from 02/22/2024 in [facility] Admission (Discharged) from 02/07/2024 in [facility]  C-SSRS RISK CATEGORY Error: Q3, 4, or 5 should not be populated when Q2 is No Error: Q7 should not be populated when Q6 is No No Risk     Assessment and Plan: Patient notes that her mood, anxiety, and depression has improved since her last visit. She did stop Lamictal  noting that she believed her unborn son may have a cleft lip. Provider informed patient that cleft lip has a small percentage of developing with Lamictal . She endorsed understanding and  notes that she may want to restart it. Today patient  agreeable to restarting Lamictal  25 mg for two weeks. She will then increase to 50 mg for two weeks and then 75 mg. Patient also wants to continue Prozac  as prescribed.  N  1. Generalized anxiety disorder (Primary)  Start- FLUoxetine  (PROZAC ) 10 MG capsule; Take 1 capsule (10 mg total) by mouth daily.  Dispense: 30 capsule; Refill: 3  2. Bipolar I disorder (HCC)  Restart- lamoTRIgine  (LAMICTAL ) 25 MG tablet; Take 1 tablet (25 mg total) by mouth daily.  Dispense: 90 tablet; Refill: 1   Follow-up in 1 month    Zane FORBES Bach, NP 04/10/2024, 9:12 AM

## 2024-04-24 ENCOUNTER — Encounter (HOSPITAL_COMMUNITY): Payer: Self-pay | Admitting: Obstetrics & Gynecology

## 2024-04-24 ENCOUNTER — Inpatient Hospital Stay (HOSPITAL_COMMUNITY)
Admission: AD | Admit: 2024-04-24 | Discharge: 2024-04-24 | Disposition: A | Discharge status: Home / Self Care | Payer: MEDICAID | Attending: Obstetrics and Gynecology | Admitting: Obstetrics and Gynecology

## 2024-04-24 ENCOUNTER — Telehealth: Payer: Self-pay | Admitting: Family Medicine

## 2024-04-24 ENCOUNTER — Encounter: Payer: Self-pay | Admitting: Family Medicine

## 2024-04-24 DIAGNOSIS — Z3A26 26 weeks gestation of pregnancy: Secondary | ICD-10-CM | POA: Diagnosis not present

## 2024-04-24 DIAGNOSIS — O99612 Diseases of the digestive system complicating pregnancy, second trimester: Secondary | ICD-10-CM | POA: Insufficient documentation

## 2024-04-24 DIAGNOSIS — K219 Gastro-esophageal reflux disease without esophagitis: Secondary | ICD-10-CM | POA: Insufficient documentation

## 2024-04-24 DIAGNOSIS — R1012 Left upper quadrant pain: Secondary | ICD-10-CM | POA: Diagnosis present

## 2024-04-24 DIAGNOSIS — O26892 Other specified pregnancy related conditions, second trimester: Secondary | ICD-10-CM

## 2024-04-24 DIAGNOSIS — N644 Mastodynia: Secondary | ICD-10-CM | POA: Diagnosis present

## 2024-04-24 LAB — COMPREHENSIVE METABOLIC PANEL WITH GFR
ALT: 6 U/L (ref 0–44)
AST: 14 U/L — ABNORMAL LOW (ref 15–41)
Albumin: 3.5 g/dL (ref 3.5–5.0)
Alkaline Phosphatase: 139 U/L — ABNORMAL HIGH (ref 38–126)
Anion gap: 9 (ref 5–15)
BUN: 7 mg/dL (ref 6–20)
CO2: 24 mmol/L (ref 22–32)
Calcium: 9.2 mg/dL (ref 8.9–10.3)
Chloride: 104 mmol/L (ref 98–111)
Creatinine, Ser: 0.49 mg/dL (ref 0.44–1.00)
GFR, Estimated: >60 mL/min
Glucose, Bld: 74 mg/dL (ref 70–99)
Potassium: 3.7 mmol/L (ref 3.5–5.1)
Sodium: 136 mmol/L (ref 135–145)
Total Bilirubin: 0.2 mg/dL (ref 0.0–1.2)
Total Protein: 6.4 g/dL — ABNORMAL LOW (ref 6.5–8.1)

## 2024-04-24 LAB — URINALYSIS, ROUTINE W REFLEX MICROSCOPIC
Bilirubin Urine: NEGATIVE
Glucose, UA: NEGATIVE mg/dL
Hgb urine dipstick: NEGATIVE
Ketones, ur: NEGATIVE mg/dL
Nitrite: NEGATIVE
Protein, ur: NEGATIVE mg/dL
Specific Gravity, Urine: 1.025 (ref 1.005–1.030)
pH: 5 (ref 5.0–8.0)

## 2024-04-24 LAB — CBC
HCT: 32 % — ABNORMAL LOW (ref 36.0–46.0)
Hemoglobin: 10.5 g/dL — ABNORMAL LOW (ref 12.0–15.0)
MCH: 28.7 pg (ref 26.0–34.0)
MCHC: 32.8 g/dL (ref 30.0–36.0)
MCV: 87.4 fL (ref 80.0–100.0)
Platelets: 302 K/uL (ref 150–400)
RBC: 3.66 MIL/uL — ABNORMAL LOW (ref 3.87–5.11)
RDW: 13.7 % (ref 11.5–15.5)
WBC: 13.6 K/uL — ABNORMAL HIGH (ref 4.0–10.5)
nRBC: 0 % (ref 0.0–0.2)

## 2024-04-24 MED ORDER — LACTATED RINGERS IV BOLUS
1000.0000 mL | Freq: Once | INTRAVENOUS | Status: AC
  Administered 2024-04-24: 1000 mL via INTRAVENOUS

## 2024-04-24 MED ORDER — FAMOTIDINE IN NACL 20-0.9 MG/50ML-% IV SOLN
20.0000 mg | Freq: Once | INTRAVENOUS | Status: AC
  Administered 2024-04-24: 20 mg via INTRAVENOUS
  Filled 2024-04-24: qty 50

## 2024-04-24 MED ORDER — OMEPRAZOLE MAGNESIUM 20 MG PO TBEC: 20.0000 mg | DELAYED_RELEASE_TABLET | Freq: Every day | ORAL | 2 refills | Status: DC

## 2024-04-24 NOTE — MAU Note (Signed)
 Miranda Robertson is a 34 y.o. at [redacted]w[redacted]d here in MAU reporting bad cramps off and on in L upper quad of abdomen. Feels tired, nausea, some chest pain in L chest at breast. Reports regular BM as she had problems with constipation. Denies VB or LOF. Reports good FM. No pain currently but pain is 9-10 when it occurs  LMP: na Onset of complaint: 2-3 days Pain score: 0 Vitals:   04/24/24 1913 04/24/24 1916  BP:  126/75  Pulse: 79   Resp: 17   Temp: 98.6 F (37 C)   SpO2: 100%      FHT: 155  Lab orders placed from triage: u'a

## 2024-04-24 NOTE — Telephone Encounter (Signed)
 Addressed via MyChart.

## 2024-04-24 NOTE — MAU Provider Note (Signed)
 " History     [id number]  Arrival date and time: 04/24/24 1813    Chief Complaint  Patient presents with   Abdominal Pain     HPI Miranda Robertson is a 34 y.o. at 104w5d with known GERD on famotidine  who presents for epigastric pain.  She states that she has poor p.o. intake due to the pain as any food or drink exacerbates the pain.  Denies vaginal bleeding, leakage of fluid, contractions.  Endorses good fetal movement.  GERD  O/Positive/-- (11/10 1603)  Past Medical History:  Diagnosis Date   Allergy    Anemia    Anxiety    Asthma    Childhood   Blood transfusion without reported diagnosis    Depression    Drug use affecting pregnancy in third trimester 04/30/2014   Cannabis use without complication  Last used several months     Gestational hypertension    HSV (herpes simplex virus) infection    Lost custody of children 04/30/2014   DSS has custody of 1st child, states I'm going to delivery in another county, they can't take this one     Mental disorder    bipolar, depression, mild menatl retardation, suicidal thoughts in 2010   Substance abuse [facility])     Past Surgical History:  Procedure Laterality Date   CESAREAN SECTION  06/09/2012   FTP   CESAREAN SECTION N/A 05/21/2014   Procedure: CESAREAN SECTION;  Surgeon: Glenys GORMAN Birk, MD;  Location: [facility];  Service: Obstetrics;  Laterality: N/A;   CHOLECYSTECTOMY     GALLBLADDER SURGERY  2014   WISDOM TOOTH EXTRACTION      Family History  Problem Relation Age of Onset   Headache Father    Alcohol abuse Father    Drug abuse Father    Headache Sister    ADD / ADHD Sister    Anxiety disorder Sister    Depression Sister    Obesity Paternal Grandmother    COPD Paternal Aunt     Social History   Socioeconomic History   Marital status: Single    Spouse name: Not on file   Number of children: Not on file   Years of education: Not on file   Highest education level: 12th grade  Occupational History   Not on file   Tobacco Use   Smoking status: Never   Smokeless tobacco: Never  Vaping Use   Vaping status: Never Used  Substance and Sexual Activity   Alcohol use: Not Currently   Drug use: Not Currently    Types: Marijuana    Comment: last use  Mar 03 2024   Sexual activity: Yes    Birth control/protection: Condom    Comment: occasional condom use  Other Topics Concern   Not on file  Social History Narrative   Right handed   Caffeine intake 8 cups a week   Social Drivers of Health   Tobacco Use: Low Risk (04/24/2024)   Patient History    Smoking Tobacco Use: Never    Smokeless Tobacco Use: Never    Passive Exposure: Not on file  Financial Resource Strain: Medium Risk (12/01/2022)   Overall Financial Resource Strain (CARDIA)    Difficulty of Paying Living Expenses: Somewhat hard  Food Insecurity: Food Insecurity Present (12/01/2022)   Hunger Vital Sign    Worried About Running Out of Food in the Last Year: Sometimes true    Ran Out of Food in the Last Year: Sometimes true  Transportation Needs:  No Transportation Needs (12/01/2022)   PRAPARE - Administrator, Civil Service (Medical): No    Lack of Transportation (Non-Medical): No  Physical Activity: Sufficiently Active (12/01/2022)   Exercise Vital Sign    Days of Exercise per Week: 6 days    Minutes of Exercise per Session: 60 min  Stress: Stress Concern Present (12/01/2022)   [employer] of Occupational Health - Occupational Stress Questionnaire    Feeling of Stress : To some extent  Social Connections: Moderately Integrated (12/01/2022)   Social Connection and Isolation Panel    Frequency of Communication with Friends and Family: More than three times a week    Frequency of Social Gatherings with Friends and Family: Once a week    Attends Religious Services: 1 to 4 times per year    Active Member of Clubs or Organizations: Yes    Attends Engineer, Structural: More than 4 times per year    Marital Status:  Never married  Intimate Partner Violence: Not on file  Depression (PHQ2-9): High Risk (04/10/2024)   Depression (PHQ2-9)    PHQ-2 Score: 11  Alcohol Screen: Low Risk (12/01/2022)   Alcohol Screen    Last Alcohol Screening Score (AUDIT): 2  Housing: High Risk (12/01/2022)   Housing    Last Housing Risk Score: 98  Utilities: Not on file  Health Literacy: Not on file    Allergies[1]  Medications Ordered Prior to Encounter[2]  Pertinent positives and negative per HPI, all others reviewed and negative  Physical Exam   BP 122/60   Pulse 78   Temp 98.6 F (37 C)   Resp 17   Ht 5' 6 (1.676 m)   Wt 102.5 kg   LMP 10/20/2023 (Exact Date)   SpO2 100%   BMI 36.48 kg/m   Patient Vitals for the past 24 hrs:  BP Temp Pulse Resp SpO2 Height Weight  04/24/24 2009 122/60 -- 78 -- -- -- --  04/24/24 1916 126/75 -- -- -- -- -- --  04/24/24 1913 -- 98.6 F (37 C) 79 17 100 % 5' 6 (1.676 m) 102.5 kg    Physical Exam Vitals and nursing note reviewed.  Constitutional:      Appearance: She is well-developed.  HENT:     Head: Normocephalic and atraumatic.     Mouth/Throat:     Mouth: Mucous membranes are moist.  Eyes:     Extraocular Movements: Extraocular movements intact.  Cardiovascular:     Rate and Rhythm: Normal rate and regular rhythm.  Pulmonary:     Effort: Pulmonary effort is normal.  Abdominal:     Palpations: Abdomen is soft.     Tenderness: There is no abdominal tenderness.  Skin:    Capillary Refill: Capillary refill takes less than 2 seconds.  Neurological:     General: No focal deficit present.     Mental Status: She is alert.      Labs Results for orders placed or performed during the hospital encounter of 04/24/24 (from the past 24 hours)  Urinalysis, Routine w reflex microscopic -Urine, Clean Catch     Status: Abnormal   Collection Time: 04/24/24  7:20 PM  Result Value Ref Range   Color, Urine YELLOW YELLOW   APPearance CLOUDY (A) CLEAR   Specific  Gravity, Urine 1.025 1.005 - 1.030   pH 5.0 5.0 - 8.0   Glucose, UA NEGATIVE NEGATIVE mg/dL   Hgb urine dipstick NEGATIVE NEGATIVE   Bilirubin Urine NEGATIVE NEGATIVE  Ketones, ur NEGATIVE NEGATIVE mg/dL   Protein, ur NEGATIVE NEGATIVE mg/dL   Nitrite NEGATIVE NEGATIVE   Leukocytes,Ua SMALL (A) NEGATIVE   RBC / HPF 0-5 0 - 5 RBC/hpf   WBC, UA 6-10 0 - 5 WBC/hpf   Bacteria, UA RARE (A) NONE SEEN   Squamous Epithelial / HPF 6-10 0 - 5 /HPF   Mucus PRESENT    Ca Oxalate Crys, UA PRESENT   CBC     Status: Abnormal   Collection Time: 04/24/24  8:26 PM  Result Value Ref Range   WBC 13.6 (H) 4.0 - 10.5 K/uL   RBC 3.66 (L) 3.87 - 5.11 MIL/uL   Hemoglobin 10.5 (L) 12.0 - 15.0 g/dL   HCT 67.9 (L) 63.9 - 53.9 %   MCV 87.4 80.0 - 100.0 fL   MCH 28.7 26.0 - 34.0 pg   MCHC 32.8 30.0 - 36.0 g/dL   RDW 86.2 88.4 - 84.4 %   Platelets 302 150 - 400 K/uL   nRBC 0.0 0.0 - 0.2 %  Comprehensive metabolic panel     Status: Abnormal   Collection Time: 04/24/24  8:26 PM  Result Value Ref Range   Sodium 136 135 - 145 mmol/L   Potassium 3.7 3.5 - 5.1 mmol/L   Chloride 104 98 - 111 mmol/L   CO2 24 22 - 32 mmol/L   Glucose, Bld 74 70 - 99 mg/dL   BUN 7 6 - 20 mg/dL   Creatinine, Ser 9.50 0.44 - 1.00 mg/dL   Calcium 9.2 8.9 - 89.6 mg/dL   Total Protein 6.4 (L) 6.5 - 8.1 g/dL   Albumin 3.5 3.5 - 5.0 g/dL   AST 14 (L) 15 - 41 U/L   ALT 6 0 - 44 U/L   Alkaline Phosphatase 139 (H) 38 - 126 U/L   Total Bilirubin 0.2 0.0 - 1.2 mg/dL   GFR, Estimated >39 >39 mL/min   Anion gap 9 5 - 15    Imaging No results found.  MAU Course  Procedures  Lab Orders         Urinalysis, Routine w reflex microscopic -Urine, Clean Catch         CBC         Comprehensive metabolic panel    Meds ordered this encounter  Medications   lactated ringers  bolus 1,000 mL   famotidine  (PEPCID ) IVPB 20 mg premix   omeprazole  (PRILOSEC  OTC) 20 MG tablet    Sig: Take 1 tablet (20 mg total) by mouth daily.    Dispense:   30 tablet    Refill:  2   Imaging Orders  No imaging studies ordered today    MDM Moderate (Level 3-4)  Assessment and Plan  Gastroesophageal reflux disease without esophagitis  [redacted] weeks gestation of pregnancy   Miranda Robertson is a 34 y.o. at [redacted]w[redacted]d with known GERD on famotidine  who presents for epigastric pain.   - Given a liter of fluid and 20 mg of IV famotidine  with resolution of symptoms - CBC with evidence of mild anemia - CMP with no evidence of endorgan damage.  -Stable for discharge home - Rx sent for Prilosec  to be taken in addition to famotidine .  Discussed that Prilosec  must be taken daily for a few weeks to take full effect - Education provided on GERD management - All questions answered, anticipatory guidance, and detailed return precautions provided.    Benard Minturn L Mateo Overbeck, MD/MHA 04/24/2024 9:48 PM  Allergies as of 04/24/2024  No Known Allergies      Medication List     STOP taking these medications    chlorhexidine  0.12 % solution Commonly known as: Peridex    cyclobenzaprine  5 MG tablet Commonly known as: FLEXERIL        TAKE these medications    acetaminophen  500 MG tablet Commonly known as: TYLENOL  Take 2 tablets (1,000 mg total) by mouth every 6 (six) hours as needed.   albuterol  108 (90 Base) MCG/ACT inhaler Commonly known as: VENTOLIN  HFA Inhale 1-2 puffs into the lungs every 6 (six) hours as needed for wheezing or shortness of breath.   aspirin  EC 81 MG tablet Take 1 tablet (81 mg total) by mouth daily. Start taking when you are [redacted] weeks pregnant for rest of pregnancy for prevention of preeclampsia   docusate sodium  100 MG capsule Commonly known as: COLACE Take 1 capsule (100 mg total) by mouth 2 (two) times daily as needed.   famotidine  20 MG tablet Commonly known as: PEPCID  Take 1 tablet (20 mg total) by mouth daily.   FLUoxetine  10 MG capsule Commonly known as: PROZAC  Take 1 capsule (10 mg total) by mouth daily.    lamoTRIgine  25 MG tablet Commonly known as: LaMICtal  Take 1 tablet (25 mg total) by mouth daily.   omeprazole  20 MG tablet Commonly known as: PriLOSEC  OTC Take 1 tablet (20 mg total) by mouth daily.   ondansetron  4 MG tablet Commonly known as: Zofran  Take 1 tablet (4 mg total) by mouth daily as needed for nausea or vomiting.   promethazine  25 MG tablet Commonly known as: PHENERGAN  Take 1 tablet (25 mg total) by mouth at bedtime.   valACYclovir  1000 MG tablet Commonly known as: VALTREX  Take 1 tablet (1,000 mg total) by mouth daily.   Vitafol  Gummies 3.33-0.333-34.8 MG Chew Chew 3 tablets by mouth daily.           [1] No Known Allergies [2]  No current facility-administered medications on file prior to encounter.   Current Outpatient Medications on File Prior to Encounter  Medication Sig Dispense Refill   acetaminophen  (TYLENOL ) 500 MG tablet Take 2 tablets (1,000 mg total) by mouth every 6 (six) hours as needed.     albuterol  (VENTOLIN  HFA) 108 (90 Base) MCG/ACT inhaler Inhale 1-2 puffs into the lungs every 6 (six) hours as needed for wheezing or shortness of breath. 6.7 g 0   aspirin  EC 81 MG tablet Take 1 tablet (81 mg total) by mouth daily. Start taking when you are [redacted] weeks pregnant for rest of pregnancy for prevention of preeclampsia 300 tablet 2   chlorhexidine  (PERIDEX ) 0.12 % solution Use as directed 15 mLs in the mouth or throat 2 (two) times daily. (Patient not taking: Reported on 03/27/2024) 120 mL 0   cyclobenzaprine  (FLEXERIL ) 5 MG tablet Take 1 tablet (5 mg total) by mouth 3 (three) times daily as needed for muscle spasms. (Patient not taking: Reported on 03/27/2024) 20 tablet 0   docusate sodium  (COLACE) 100 MG capsule Take 1 capsule (100 mg total) by mouth 2 (two) times daily as needed. 30 capsule 2   famotidine  (PEPCID ) 20 MG tablet Take 1 tablet (20 mg total) by mouth daily. 60 tablet 3   FLUoxetine  (PROZAC ) 10 MG capsule Take 1 capsule (10 mg total) by  mouth daily. 30 capsule 3   lamoTRIgine  (LAMICTAL ) 25 MG tablet Take 1 tablet (25 mg total) by mouth daily. 90 tablet 1   ondansetron  (ZOFRAN ) 4 MG tablet Take 1  tablet (4 mg total) by mouth daily as needed for nausea or vomiting. 30 tablet 1   Prenatal Vit-Fe Phos-FA-Omega (VITAFOL  GUMMIES) 3.33-0.333-34.8 MG CHEW Chew 3 tablets by mouth daily. 90 tablet 11   promethazine  (PHENERGAN ) 25 MG tablet Take 1 tablet (25 mg total) by mouth at bedtime. 30 tablet 2   valACYclovir  (VALTREX ) 1000 MG tablet Take 1 tablet (1,000 mg total) by mouth daily. (Patient not taking: Reported on 03/27/2024) 5 tablet 2   "

## 2024-04-24 NOTE — Telephone Encounter (Signed)
 Patient is concerned because she is scheduled to do her glucose test this Friday. She says that she has to eat as soon as she wakes up every morning if not she will throw up really bad. She wants to know what she can do about this since she needs to fast for her appt on Friday.

## 2024-04-25 ENCOUNTER — Other Ambulatory Visit: Payer: Self-pay

## 2024-04-25 DIAGNOSIS — O0990 Supervision of high risk pregnancy, unspecified, unspecified trimester: Secondary | ICD-10-CM

## 2024-04-27 ENCOUNTER — Other Ambulatory Visit: Payer: MEDICAID

## 2024-04-27 ENCOUNTER — Other Ambulatory Visit: Payer: Self-pay

## 2024-04-27 ENCOUNTER — Ambulatory Visit: Payer: MEDICAID | Admitting: Family Medicine

## 2024-04-27 VITALS — BP 128/83 | HR 72 | Wt 217.0 lb

## 2024-04-27 DIAGNOSIS — F3177 Bipolar disorder, in partial remission, most recent episode mixed: Secondary | ICD-10-CM

## 2024-04-27 DIAGNOSIS — R8271 Bacteriuria: Secondary | ICD-10-CM

## 2024-04-27 DIAGNOSIS — Z8759 Personal history of other complications of pregnancy, childbirth and the puerperium: Secondary | ICD-10-CM | POA: Diagnosis not present

## 2024-04-27 DIAGNOSIS — Z3A27 27 weeks gestation of pregnancy: Secondary | ICD-10-CM

## 2024-04-27 DIAGNOSIS — O468X2 Other antepartum hemorrhage, second trimester: Secondary | ICD-10-CM | POA: Diagnosis not present

## 2024-04-27 DIAGNOSIS — Z98891 History of uterine scar from previous surgery: Secondary | ICD-10-CM | POA: Diagnosis not present

## 2024-04-27 DIAGNOSIS — O0992 Supervision of high risk pregnancy, unspecified, second trimester: Secondary | ICD-10-CM

## 2024-04-27 DIAGNOSIS — O0990 Supervision of high risk pregnancy, unspecified, unspecified trimester: Secondary | ICD-10-CM

## 2024-04-27 DIAGNOSIS — K219 Gastro-esophageal reflux disease without esophagitis: Secondary | ICD-10-CM | POA: Diagnosis not present

## 2024-04-27 MED ORDER — METOCLOPRAMIDE HCL 10 MG PO TABS: 10.0000 mg | ORAL_TABLET | Freq: Three times a day (TID) | ORAL | 0 refills | Status: AC | PRN

## 2024-04-27 MED ORDER — FAMOTIDINE 20 MG PO TABS: 20.0000 mg | ORAL_TABLET | Freq: Every day | ORAL | 6 refills | Status: DC

## 2024-04-27 MED ORDER — OMEPRAZOLE MAGNESIUM 20 MG PO TBEC: 20.0000 mg | DELAYED_RELEASE_TABLET | Freq: Every day | ORAL | 6 refills | Status: DC

## 2024-04-27 NOTE — Progress Notes (Signed)
 "  PRENATAL VISIT NOTE  Subjective:  Miranda Robertson is a 34 y.o. G3P2002 at [redacted]w[redacted]d being seen today for ongoing prenatal care.  She is currently monitored for the following issues for this high-risk pregnancy and has HSV-2 (herpes simplex virus 2) infection; History of cesarean section x 2; Attention deficit hyperactivity disorder (ADHD), predominantly inattentive type; Bipolar 1 disorder, mixed, partial remission (HCC); Depressive disorder, atypical; PTSD (post-traumatic stress disorder); History of syphilis; Supervision of high risk pregnancy, antepartum; GBS bacteriuria; Subchorionic hemorrhage in second trimester; History of gestational hypertension; and Marijuana abuse, continuous on their problem list.  Patient reports no complaints.  Contractions: Not present. Vag. Bleeding: None.  Movement: Present. Denies leaking of fluid.   The following portions of the patient's history were reviewed and updated as appropriate: allergies, current medications, past family history, past medical history, past social history, past surgical history and problem list.   Objective:   Vitals:   04/27/24 0827  BP: 128/83  Pulse: 72  Weight: 217 lb (98.4 kg)    Fetal Status:  Fetal Heart Rate (bpm): 145   Movement: Present    General: Alert, oriented and cooperative. Patient is in no acute distress.  Skin: Skin is warm and dry. No rash noted.   Cardiovascular: Normal heart rate noted  Respiratory: Normal respiratory effort, no problems with respiration noted  Abdomen: Soft, gravid, appropriate for gestational age.  Pain/Pressure: Present (pressure pelvic)     Pelvic: Cervical exam deferred        Extremities: Normal range of motion.  Edema: None  Mental Status: Normal mood and affect. Normal behavior. Normal judgment and thought content.      04/10/2024    9:01 AM 02/27/2024    3:46 PM 02/22/2024    3:08 PM  Depression screen PHQ 2/9  Decreased Interest  2   Down, Depressed, Hopeless  2   PHQ - 2  Score  4   Altered sleeping  1   Tired, decreased energy  2   Change in appetite  1   Feeling bad or failure about yourself   2   Trouble concentrating  2   Moving slowly or fidgety/restless  2   Suicidal thoughts  0   PHQ-9 Score  14   Difficult doing work/chores        Information is confidential and restricted. Go to Review Flowsheets to unlock data.        04/10/2024    9:01 AM 02/22/2024    3:10 PM 12/12/2023    8:47 AM 09/15/2023    1:46 PM  GAD 7 : Generalized Anxiety Score  Nervous, Anxious, on Edge   1   Control/stop worrying   1   Worry too much - different things   1   Trouble relaxing   1   Restless   1   Easily annoyed or irritable   1   Afraid - awful might happen   0   Total GAD 7 Score   6   Anxiety Difficulty         Information is confidential and restricted. Go to Review Flowsheets to unlock data.    Assessment and Plan:  Pregnancy: G3P2002 at [redacted]w[redacted]d 1. History of cesarean section x 2 (Primary) Desires RCS Will schedule at future appt as surgical schedule for April is not yet out  2. Supervision of high risk pregnancy, antepartum Up to date Vigorous movement FH appropriate Concerns - Tdap- partner does not want this. Provided information -  Discussed MJ use, she does not report anything more than THC. She uses for her mood and her nausea.   3. Subchorionic hemorrhage in second trimester No bleeding  4. History of gestational hypertension BP  5. Bipolar 1 disorder, mixed, partial remission (HCC) Stable  6. GBS bacteriuria Anceph  7. Gastroesophageal reflux disease without esophagitis Rx for famotidine  and omeprazole    Preterm labor symptoms and general obstetric precautions including but not limited to vaginal bleeding, contractions, leaking of fluid and fetal movement were reviewed in detail with the patient. Please refer to After Visit Summary for other counseling recommendations.   Return in about 2 weeks (around 05/11/2024) for  Routine prenatal care.  Future Appointments  Date Time Provider Department Center  05/10/2024  9:55 AM Nicholaus Burnard HERO, MD [facility]  05/11/2024 10:00 AM Harl Zane BRAVO, NP [facility] None  05/24/2024 10:55 AM Herchel Gloris LABOR, MD [facility]  06/05/2024  8:15 AM [facility] PROVIDER 1 [facility]  06/05/2024  8:30 AM [facility] US2 [facility]  06/05/2024  9:35 AM Warren-Hill, Camie LABOR, CNM [facility]    Suzen Maryan Masters, MD "

## 2024-05-01 LAB — SYPHILIS: RPR W/REFLEX TO RPR TITER AND TREPONEMAL ANTIBODIES, TRADITIONAL SCREENING AND DIAGNOSIS ALGORITHM: RPR Ser Ql: NONREACTIVE

## 2024-05-01 LAB — GLUCOSE TOLERANCE, 1 HOUR: Glucose, 1Hr PP: 103 mg/dL (ref 70–199)

## 2024-05-01 LAB — HIV ANTIBODY (ROUTINE TESTING W REFLEX): HIV Screen 4th Generation wRfx: NONREACTIVE

## 2024-05-03 ENCOUNTER — Encounter: Payer: Self-pay | Admitting: Obstetrics and Gynecology

## 2024-05-04 ENCOUNTER — Telehealth: Payer: Self-pay | Admitting: Lactation Services

## 2024-05-04 NOTE — Telephone Encounter (Signed)
 PA for Omeprazole  submitted through covermymeds, awaiting determination.   Miranda Robertson (Key: [id number]) PA Case ID #: [id number] Rx #: [id number] Need Help? Call us  at [phone] Status sent iconSent to Plan today Drug Omeprazole  Magnesium  20MG  dr tablets ePA cloud logo Form [employer] Electronic Prior Authorization Form Original Claim Info 75 Possible Safety Risk of Drug-Drug Interaction. Possible Safety Risk of DuplicateTherapy. [facility] Help Desk [phone]. For3 day temporary supply, use Level of Service 03.

## 2024-05-07 NOTE — Telephone Encounter (Signed)
 Miranda Robertson (Key: [id number]) PA Case ID #: [id number] Rx #: [id number] Need Help? Call us  at [phone] Outcome N/A on January 16 by [employer] 2017 General.Closed by health plan.We thank you for taking the time to submit your request. However, this request has been closed because on 04/27/2024 the member's medication has been switched to a recommended formulary alternative, Omeprazole  Oral Capsule Delayed Release 20 MG. Drug Omeprazole  Magnesium  20MG  dr tablets ePA cloud logo Form [employer] Electronic Prior Authorization Form Original Claim Info 75 Possible Safety Risk of Drug-Drug Interaction. Possible Safety Risk of DuplicateTherapy. [facility] Help Desk [phone]. For3 day temporary supply, use Level of Service 03.

## 2024-05-10 ENCOUNTER — Encounter: Payer: Self-pay | Admitting: Obstetrics and Gynecology

## 2024-05-10 ENCOUNTER — Other Ambulatory Visit: Payer: Self-pay

## 2024-05-10 ENCOUNTER — Ambulatory Visit: Payer: MEDICAID | Admitting: Obstetrics and Gynecology

## 2024-05-10 ENCOUNTER — Other Ambulatory Visit: Payer: Self-pay | Admitting: Family Medicine

## 2024-05-10 VITALS — BP 124/86 | HR 93 | Wt 228.9 lb

## 2024-05-10 DIAGNOSIS — Z8619 Personal history of other infectious and parasitic diseases: Secondary | ICD-10-CM

## 2024-05-10 DIAGNOSIS — R079 Chest pain, unspecified: Secondary | ICD-10-CM | POA: Diagnosis not present

## 2024-05-10 DIAGNOSIS — M25473 Effusion, unspecified ankle: Secondary | ICD-10-CM | POA: Diagnosis not present

## 2024-05-10 DIAGNOSIS — R030 Elevated blood-pressure reading, without diagnosis of hypertension: Secondary | ICD-10-CM

## 2024-05-10 DIAGNOSIS — F3177 Bipolar disorder, in partial remission, most recent episode mixed: Secondary | ICD-10-CM | POA: Diagnosis not present

## 2024-05-10 DIAGNOSIS — B009 Herpesviral infection, unspecified: Secondary | ICD-10-CM | POA: Diagnosis not present

## 2024-05-10 DIAGNOSIS — O0990 Supervision of high risk pregnancy, unspecified, unspecified trimester: Secondary | ICD-10-CM

## 2024-05-10 DIAGNOSIS — Z8759 Personal history of other complications of pregnancy, childbirth and the puerperium: Secondary | ICD-10-CM

## 2024-05-10 DIAGNOSIS — R8271 Bacteriuria: Secondary | ICD-10-CM

## 2024-05-10 DIAGNOSIS — Z3A29 29 weeks gestation of pregnancy: Secondary | ICD-10-CM | POA: Diagnosis not present

## 2024-05-10 DIAGNOSIS — Z98891 History of uterine scar from previous surgery: Secondary | ICD-10-CM

## 2024-05-10 DIAGNOSIS — O0993 Supervision of high risk pregnancy, unspecified, third trimester: Secondary | ICD-10-CM | POA: Diagnosis not present

## 2024-05-10 MED ORDER — PANTOPRAZOLE SODIUM 20 MG PO TBEC: 20.0000 mg | DELAYED_RELEASE_TABLET | Freq: Every day | ORAL | 2 refills | Status: AC

## 2024-05-10 MED ORDER — VALACYCLOVIR HCL 500 MG PO TABS: 500.0000 mg | ORAL_TABLET | Freq: Two times a day (BID) | ORAL | 2 refills | Status: AC

## 2024-05-10 NOTE — Progress Notes (Signed)
 "  PRENATAL VISIT NOTE  Subjective:  Miranda Robertson is a 34 y.o. G3P2002 at [redacted]w[redacted]d being seen today for ongoing prenatal care.  She is currently monitored for the following issues for this high-risk pregnancy and has HSV-2 (herpes simplex virus 2) infection; History of cesarean section x 2; Gestational hypertension; Attention deficit hyperactivity disorder (ADHD), predominantly inattentive type; Bipolar 1 disorder, mixed, partial remission (HCC); Depressive disorder, atypical; PTSD (post-traumatic stress disorder); History of syphilis; Supervision of high risk pregnancy, antepartum; GBS bacteriuria; Subchorionic hemorrhage in second trimester; History of gestational hypertension; and Marijuana abuse, continuous on their problem list.  Patient reports significant swelling in her feet at night. Saw two specks of discharge with poop.  Still feeling needles poking in her chest. Can happen at any point. Has gotten up to 8/10 but usually not that painful. Does feel some shortness of breath, can happen at any point. Does occasionally feels light-headed during pregnancy, has not fainted. Does feel like her heart is racing constantly.  Contractions: Irritability. Vag. Bleeding: None.  Movement: Present. Denies leaking of fluid.   The following portions of the patient's history were reviewed and updated as appropriate: allergies, current medications, past family history, past medical history, past social history, past surgical history and problem list.   Objective:   Vitals:   05/10/24 1022 05/10/24 1115  BP: (!) 152/91 124/86  Pulse: 95 93  Weight: 228 lb 14.4 oz (103.8 kg)     Fetal Status:  Fetal Heart Rate (bpm): 147   Movement: Present    General: Alert, oriented and cooperative. Patient is in no acute distress.  Skin: Skin is warm and dry. No rash noted.   Cardiovascular: Normal heart rate noted  Respiratory: Normal respiratory effort, no problems with respiration noted  Abdomen: Soft, gravid,  appropriate for gestational age.  Pain/Pressure: Present     Pelvic: Cervical exam deferred        Extremities: Normal range of motion.  Edema: Trace  Mental Status: Normal mood and affect. Normal behavior. Normal judgment and thought content.      04/10/2024    9:01 AM 02/27/2024    3:46 PM 02/22/2024    3:08 PM  Depression screen PHQ 2/9  Decreased Interest  2   Down, Depressed, Hopeless  2   PHQ - 2 Score  4   Altered sleeping  1   Tired, decreased energy  2   Change in appetite  1   Feeling bad or failure about yourself   2   Trouble concentrating  2   Moving slowly or fidgety/restless  2   Suicidal thoughts  0   PHQ-9 Score  14   Difficult doing work/chores        Information is confidential and restricted. Go to Review Flowsheets to unlock data.        04/10/2024    9:01 AM 02/22/2024    3:10 PM 12/12/2023    8:47 AM 09/15/2023    1:46 PM  GAD 7 : Generalized Anxiety Score  Nervous, Anxious, on Edge   1    Control/stop worrying   1    Worry too much - different things   1    Trouble relaxing   1    Restless   1    Easily annoyed or irritable   1    Afraid - awful might happen   0    Total GAD 7 Score   6   Anxiety Difficulty  Information is confidential and restricted. Go to Review Flowsheets to unlock data.   Data saved with a previous flowsheet row definition    Assessment and Plan:  Pregnancy: G3P2002 at [redacted]w[redacted]d  1. History of cesarean section x 2 (Primary) Will need RCS Request sent for 39 weeks today  2. Bipolar 1 disorder, mixed, partial remission (HCC) Feels like she is stable, declines BH referral  3. GBS bacteriuria  4. Supervision of high risk pregnancy, antepartum  5. History of syphilis Txt x3 04/2023  6. HSV-2 (herpes simplex virus 2) infection Per RN, patient uncomfortable, no outbreaks -did not ask while in room as partner was there and patient requested to not discuss this in front of partner, message sent to confirm with  patient Sent valtrex  ppx  7. Ankle swelling, unspecified laterality  8. History of gestational hypertension  9. Elevated BP without diagnosis of hypertension - CBC - Comp Met (CMET) - Protein / creatinine ratio, urine One elevated BP,  Labs today -reviewed potential diagnosis, management, may need hospitalization, earlier delivery, medication  10. Chest pain, unspecified type She feels this does not feel like her acid reflux Given worsening symptoms and new onset hypertension, will refer to Cards OB Reviewed s/s to go to ED - AMB Referral to Cardio Obstetrics  Preterm labor symptoms and general obstetric precautions including but not limited to vaginal bleeding, contractions, leaking of fluid and fetal movement were reviewed in detail with the patient. Please refer to After Visit Summary for other counseling recommendations.   Return in about 2 weeks (around 05/24/2024) for high OB, 1 week for BP check.  Future Appointments  Date Time Provider Department Center  05/11/2024 10:00 AM Harl Zane BRAVO, NP [facility] None  05/15/2024 10:00 AM [facility] NURSE [facility]  05/24/2024 10:55 AM Anyanwu, Gloris LABOR, MD [facility]  06/05/2024  8:15 AM [facility] PROVIDER 1 [facility]  06/05/2024  8:30 AM [facility] US2 [facility]  06/05/2024  9:35 AM Warren-Hill, Camie LABOR, CNM [facility]    Burnard CHRISTELLA Moats, MD  "

## 2024-05-11 ENCOUNTER — Encounter (HOSPITAL_COMMUNITY): Payer: Self-pay | Admitting: Psychiatry

## 2024-05-11 ENCOUNTER — Ambulatory Visit: Payer: Self-pay | Admitting: Obstetrics and Gynecology

## 2024-05-11 ENCOUNTER — Telehealth (INDEPENDENT_AMBULATORY_CARE_PROVIDER_SITE_OTHER): Payer: MEDICAID | Admitting: Psychiatry

## 2024-05-11 DIAGNOSIS — F411 Generalized anxiety disorder: Secondary | ICD-10-CM

## 2024-05-11 LAB — CBC
Hematocrit: 33.8 % — ABNORMAL LOW (ref 34.0–46.6)
Hemoglobin: 10.9 g/dL — ABNORMAL LOW (ref 11.1–15.9)
MCH: 28.6 pg (ref 26.6–33.0)
MCHC: 32.2 g/dL (ref 31.5–35.7)
MCV: 89 fL (ref 79–97)
Platelets: 321 x10E3/uL (ref 150–450)
RBC: 3.81 x10E6/uL (ref 3.77–5.28)
RDW: 13.2 % (ref 11.7–15.4)
WBC: 12.1 x10E3/uL — ABNORMAL HIGH (ref 3.4–10.8)

## 2024-05-11 LAB — COMPREHENSIVE METABOLIC PANEL WITH GFR
ALT: 10 IU/L (ref 0–32)
AST: 13 IU/L (ref 0–40)
Albumin: 3.6 g/dL — ABNORMAL LOW (ref 3.9–4.9)
Alkaline Phosphatase: 90 IU/L (ref 41–116)
BUN/Creatinine Ratio: 10 (ref 9–23)
BUN: 5 mg/dL — ABNORMAL LOW (ref 6–20)
Bilirubin Total: 0.2 mg/dL (ref 0.0–1.2)
CO2: 19 mmol/L — ABNORMAL LOW (ref 20–29)
Calcium: 9.1 mg/dL (ref 8.7–10.2)
Chloride: 104 mmol/L (ref 96–106)
Creatinine, Ser: 0.48 mg/dL — ABNORMAL LOW (ref 0.57–1.00)
Globulin, Total: 2.6 g/dL (ref 1.5–4.5)
Glucose: 100 mg/dL — ABNORMAL HIGH (ref 70–99)
Potassium: 4 mmol/L (ref 3.5–5.2)
Sodium: 138 mmol/L (ref 134–144)
Total Protein: 6.2 g/dL (ref 6.0–8.5)
eGFR: 130 mL/min/1.73

## 2024-05-11 MED ORDER — FLUOXETINE HCL 10 MG PO CAPS: 10.0000 mg | ORAL_CAPSULE | Freq: Every day | ORAL | 3 refills | Status: AC

## 2024-05-11 NOTE — Progress Notes (Signed)
 [facility] MD/PA/NP OP Progress Note Virtual Visit via Video Note  I connected with Miranda Robertson on 05/11/24 at 10:00 AM EST by a video enabled telemedicine application and verified that I am speaking with the correct person using two identifiers.  Location: Patient: Home Provider: Clinic   I discussed the limitations of evaluation and management by telemedicine and the availability of in person appointments. The patient expressed understanding and agreed to proceed.  I provided 30 minutes of non-face-to-face time during this encounter.        05/11/2024 9:50 AM Miranda Robertson  MRN:  [id number]  Chief Complaint: I had pregnancy scares. I stopped the Lamictal   HPI: 34 year old female seen today for follow-up psychiatric evaluation.  She has a psychiatric history of ADHD, bipolar disorder 1, PTSD and depression.  She is currently managed on Prozac  10 mg daily and Lamictal  75 mg daily. She reports that she discontinued Lamictal .  Today she was well-groomed, pleasant, cooperative, and engaged in conversation.  She informed clinical research associate that she had pregnancy scares. Patient notes that her blood pressure has been increased and her OBGYN told her the risk of preeclampsia. She notes that she will have a c-section. Patient also notes that she stopped Lamictal  and will reconsider taking it after her delivery.   Since her last visit patient notes that she has been trying to rest.  She does endorse increased irritability and some anger spells.  Patient denies other symptoms of mania.  She also notes that her depression is better managed but notes that she continues to be overly anxious noting that she over thinks things.   Today provider conducted a GAD 7 and patient scored a 18, at her last visit she scored a  12.  Provider also conducted PHQ-9 and patient scored a 12, at her last visit she scored a 11.  Today she denies SI/HI/AVH or paranoia.    Today patient does not wish to restart.  She request that Prozac    not be adjusted and will continue it as prescribed.  No other concerns at this time.       Visit Diagnosis:  No diagnosis found.      Past Psychiatric History: Bipolar disorder 1, PTSD and depression   Past Medical History:  Past Medical History:  Diagnosis Date   Allergy    Anemia    Anxiety    Asthma    Childhood   Blood transfusion without reported diagnosis    Depression    Drug use affecting pregnancy in third trimester 04/30/2014   Cannabis use without complication  Last used several months     Gestational hypertension    HSV (herpes simplex virus) infection    Lost custody of children 04/30/2014   DSS has custody of 1st child, states I'm going to delivery in another county, they can't take this one     Mental disorder    bipolar, depression, mild menatl retardation, suicidal thoughts in 2010   Substance abuse (HCC)     Past Surgical History:  Procedure Laterality Date   CESAREAN SECTION  06/09/2012   FTP   CESAREAN SECTION N/A 05/21/2014   Procedure: CESAREAN SECTION;  Surgeon: Glenys GORMAN Birk, MD;  Location: [facility];  Service: Obstetrics;  Laterality: N/A;   CHOLECYSTECTOMY     GALLBLADDER SURGERY  2014   WISDOM TOOTH EXTRACTION      Family Psychiatric History: Depression mother, father, paternal grandmother, paternal aunts, paternal consins  Family History:  Family History  Problem Relation  Age of Onset   Headache Father    Alcohol abuse Father    Drug abuse Father    Headache Sister    ADD / ADHD Sister    Anxiety disorder Sister    Depression Sister    Obesity Paternal Grandmother    COPD Paternal Aunt     Social History:  Social History   Socioeconomic History   Marital status: Single    Spouse name: Not on file   Number of children: Not on file   Years of education: Not on file   Highest education level: 12th grade  Occupational History   Not on file  Tobacco Use   Smoking status: Never   Smokeless tobacco: Never  Vaping Use    Vaping status: Never Used  Substance and Sexual Activity   Alcohol use: Not Currently   Drug use: Not Currently    Types: Marijuana    Comment: last use  Mar 03 2024   Sexual activity: Yes    Birth control/protection: Condom    Comment: occasional condom use  Other Topics Concern   Not on file  Social History Narrative   Right handed   Caffeine intake 8 cups a week   Social Drivers of Health   Tobacco Use: Low Risk (05/10/2024)   Patient History    Smoking Tobacco Use: Never    Smokeless Tobacco Use: Never    Passive Exposure: Not on file  Financial Resource Strain: Medium Risk (12/01/2022)   Overall Financial Resource Strain (CARDIA)    Difficulty of Paying Living Expenses: Somewhat hard  Food Insecurity: Food Insecurity Present (12/01/2022)   Hunger Vital Sign    Worried About Running Out of Food in the Last Year: Sometimes true    Ran Out of Food in the Last Year: Sometimes true  Transportation Needs: No Transportation Needs (12/01/2022)   PRAPARE - Administrator, Civil Service (Medical): No    Lack of Transportation (Non-Medical): No  Physical Activity: Sufficiently Active (12/01/2022)   Exercise Vital Sign    Days of Exercise per Week: 6 days    Minutes of Exercise per Session: 60 min  Stress: Stress Concern Present (12/01/2022)   [employer] of Occupational Health - Occupational Stress Questionnaire    Feeling of Stress : To some extent  Social Connections: Moderately Integrated (12/01/2022)   Social Connection and Isolation Panel    Frequency of Communication with Friends and Family: More than three times a week    Frequency of Social Gatherings with Friends and Family: Once a week    Attends Religious Services: 1 to 4 times per year    Active Member of Clubs or Organizations: Yes    Attends Banker Meetings: More than 4 times per year    Marital Status: Never married  Depression (PHQ2-9): High Risk (04/10/2024)   Depression (PHQ2-9)     PHQ-2 Score: 11  Alcohol Screen: Low Risk (12/01/2022)   Alcohol Screen    Last Alcohol Screening Score (AUDIT): 2  Housing: High Risk (12/01/2022)   Housing    Last Housing Risk Score: 98  Utilities: Not on file  Health Literacy: Not on file    Allergies: No Known Allergies  Metabolic Disorder Labs: Lab Results  Component Value Date   HGBA1C 4.8 02/27/2024   MPG 111 01/26/2007   No results found for: PROLACTIN Lab Results  Component Value Date   CHOL 187 12/09/2017   TRIG 201 (H) 12/09/2017  HDL 53 12/09/2017   CHOLHDL 3.5 12/09/2017   VLDL 57 (H) 01/26/2007   LDLCALC 94 12/09/2017   LDLCALC 80 07/23/2016   Lab Results  Component Value Date   TSH 0.979 12/01/2022   TSH 0.920 04/13/2022    Therapeutic Level Labs: No results found for: LITHIUM  No results found for: VALPROATE No results found for: CBMZ  Current Medications: Current Outpatient Medications  Medication Sig Dispense Refill   acetaminophen  (TYLENOL ) 500 MG tablet Take 2 tablets (1,000 mg total) by mouth every 6 (six) hours as needed.     albuterol  (VENTOLIN  HFA) 108 (90 Base) MCG/ACT inhaler Inhale 1-2 puffs into the lungs every 6 (six) hours as needed for wheezing or shortness of breath. 6.7 g 0   aspirin  EC 81 MG tablet Take 1 tablet (81 mg total) by mouth daily. Start taking when you are [redacted] weeks pregnant for rest of pregnancy for prevention of preeclampsia 300 tablet 2   docusate sodium  (COLACE) 100 MG capsule Take 1 capsule (100 mg total) by mouth 2 (two) times daily as needed. 30 capsule 2   FLUoxetine  (PROZAC ) 10 MG capsule Take 1 capsule (10 mg total) by mouth daily. 30 capsule 3   lamoTRIgine  (LAMICTAL ) 25 MG tablet Take 1 tablet (25 mg total) by mouth daily. (Patient not taking: Reported on 04/27/2024) 90 tablet 1   metoCLOPramide  (REGLAN ) 10 MG tablet Take 1 tablet (10 mg total) by mouth every 8 (eight) hours as needed for nausea. 30 tablet 0   ondansetron  (ZOFRAN ) 4 MG tablet Take 1 tablet  (4 mg total) by mouth daily as needed for nausea or vomiting. (Patient not taking: Reported on 04/27/2024) 30 tablet 1   pantoprazole  (PROTONIX ) 20 MG tablet Take 1 tablet (20 mg total) by mouth daily. 30 tablet 2   Prenatal Vit-Fe Phos-FA-Omega (VITAFOL  GUMMIES) 3.33-0.333-34.8 MG CHEW Chew 3 tablets by mouth daily. 90 tablet 11   promethazine  (PHENERGAN ) 25 MG tablet Take 1 tablet (25 mg total) by mouth at bedtime. 30 tablet 2   valACYclovir  (VALTREX ) 500 MG tablet Take 1 tablet (500 mg total) by mouth 2 (two) times daily. 60 tablet 2   No current facility-administered medications for this visit.     Musculoskeletal: Strength & Muscle Tone: within normal limits and Telehealth visit Gait & Station: normal, Telehealth visit Patient leans: N/A  Psychiatric Specialty Exam: Review of Systems  Last menstrual period 10/20/2023.There is no height or weight on file to calculate BMI.  General Appearance: Well Groomed  Eye Contact:  Good  Speech:  Clear and Coherent and Normal Rate  Volume:  Normal  Mood:  Anxious and Depressed,   Affect:  Appropriate and Congruent  Thought Process:  Coherent, Goal Directed and Linear  Orientation:  Full (Time, Place, and Person)  Thought Content: WDL and Logical   Suicidal Thoughts:  No  Homicidal Thoughts:  No  Memory:  Immediate;   Good Recent;   Good Remote;   Good  Judgement:  Good  Insight:  Good  Psychomotor Activity:  Normal  Concentration:  Concentration: Fair and Attention Span: Fair  Recall:  Good  Fund of Knowledge: Good  Language: Good  Akathisia:  No  Handed:  Right  AIMS (if indicated):Not done  Assets:  Communication Skills Desire for Improvement Financial Resources/Insurance Housing Intimacy Social Support  ADL's:  Intact  Cognition: WNL  Sleep:  Good   Screenings: AUDIT    Flowsheet Row Clinical Support [facility] 02/21/2020 in [facility]  Alcohol Use Disorder Identification Test Final Score  (AUDIT) 15   GAD-7    Flowsheet Row Video Visit from 04/10/2024 in [facility] Video Visit from 02/22/2024 in [facility] Clinical Support from 12/12/2023 in [facility] Clinical Support from 09/15/2023 in [facility] Video Visit from 07/22/2023 in [facility]  Total GAD-7 Score 12 21 6 15 19    PHQ2-9    Flowsheet Row Video Visit from 04/10/2024 in [facility] Initial Prenatal from [facility]/27/20[facility] Video Visit from 02/22/2024 in [facility] Clinical Support from 12/12/2023 in [facility] [place] Clinical Support from 09/15/2023 in [facility]  PHQ-2 Total Score 3 4 5 2 4   PHQ-9 Total Score 11 14 14 3 17    Flowsheet Row Admission (Discharged) from 04/24/2024 in [facility] Video Visit from 04/10/2024 in [facility] Video Visit from 02/22/2024 in [facility]  C-SSRS RISK CATEGORY No Risk Error: Q3, 4, or 5 should not be populated when Q2 is No Error: Q7 should not be populated when Q6 is No     Assessment and Plan: Patient notes that her mood, anxiety, and depression has has not worsened since her last visit.  She however notes that she is able to cope.  Patient notes that she does not wish to restart Lamictal .  She will continue Prozac  as prescribed. 1. Generalized anxiety disorder (Primary)  Start- FLUoxetine  (PROZAC ) 10 MG capsule; Take 1 capsule (10 mg total) by mouth daily.  Dispense: 30 capsule; Refill: 3     Follow-up in 2.5 month    Zane FORBES Bach, NP 05/11/2024, 9:50 AM

## 2024-05-15 ENCOUNTER — Inpatient Hospital Stay (HOSPITAL_COMMUNITY)
Admission: AD | Admit: 2024-05-15 | Discharge: 2024-05-15 | Disposition: A | Discharge status: Home / Self Care | Payer: MEDICAID | Attending: Obstetrics and Gynecology | Admitting: Obstetrics and Gynecology

## 2024-05-15 ENCOUNTER — Ambulatory Visit: Payer: Self-pay

## 2024-05-15 ENCOUNTER — Other Ambulatory Visit: Payer: Self-pay

## 2024-05-15 ENCOUNTER — Encounter (HOSPITAL_COMMUNITY): Payer: Self-pay | Admitting: Obstetrics and Gynecology

## 2024-05-15 DIAGNOSIS — Z3A29 29 weeks gestation of pregnancy: Secondary | ICD-10-CM

## 2024-05-15 DIAGNOSIS — O133 Gestational [pregnancy-induced] hypertension without significant proteinuria, third trimester: Secondary | ICD-10-CM

## 2024-05-15 LAB — URINALYSIS, ROUTINE W REFLEX MICROSCOPIC
Bilirubin Urine: NEGATIVE
Glucose, UA: NEGATIVE mg/dL
Hgb urine dipstick: NEGATIVE
Ketones, ur: NEGATIVE mg/dL
Nitrite: NEGATIVE
Protein, ur: NEGATIVE mg/dL
Specific Gravity, Urine: 1.024 (ref 1.005–1.030)
pH: 5 (ref 5.0–8.0)

## 2024-05-15 LAB — PROTEIN / CREATININE RATIO, URINE
Creatinine, Urine: 206 mg/dL
Protein Creatinine Ratio: 0.1 mg/mg
Total Protein, Urine: 25 mg/dL

## 2024-05-15 LAB — CBC
HCT: 31.4 % — ABNORMAL LOW (ref 36.0–46.0)
Hemoglobin: 10.7 g/dL — ABNORMAL LOW (ref 12.0–15.0)
MCH: 29.1 pg (ref 26.0–34.0)
MCHC: 34.1 g/dL (ref 30.0–36.0)
MCV: 85.3 fL (ref 80.0–100.0)
Platelets: 300 10*3/uL (ref 150–400)
RBC: 3.68 MIL/uL — ABNORMAL LOW (ref 3.87–5.11)
RDW: 13.2 % (ref 11.5–15.5)
WBC: 13.7 10*3/uL — ABNORMAL HIGH (ref 4.0–10.5)
nRBC: 0 % (ref 0.0–0.2)

## 2024-05-15 LAB — COMPREHENSIVE METABOLIC PANEL WITH GFR
ALT: 5 U/L (ref 0–44)
AST: 13 U/L — ABNORMAL LOW (ref 15–41)
Albumin: 3.3 g/dL — ABNORMAL LOW (ref 3.5–5.0)
Alkaline Phosphatase: 87 U/L (ref 38–126)
Anion gap: 11 (ref 5–15)
BUN: 7 mg/dL (ref 6–20)
CO2: 20 mmol/L — ABNORMAL LOW (ref 22–32)
Calcium: 8.8 mg/dL — ABNORMAL LOW (ref 8.9–10.3)
Chloride: 104 mmol/L (ref 98–111)
Creatinine, Ser: 0.42 mg/dL — ABNORMAL LOW (ref 0.44–1.00)
GFR, Estimated: >60 mL/min
Glucose, Bld: 78 mg/dL (ref 70–99)
Potassium: 3.6 mmol/L (ref 3.5–5.1)
Sodium: 135 mmol/L (ref 135–145)
Total Bilirubin: 0.2 mg/dL (ref 0.0–1.2)
Total Protein: 6.3 g/dL — ABNORMAL LOW (ref 6.5–8.1)

## 2024-05-15 NOTE — MAU Provider Note (Signed)
 " History     CSN: [id number]  Arrival date and time: 05/15/24 1403   Event Date/Time   First Provider Initiated Contact with Patient 05/15/24 1529      Chief Complaint  Patient presents with   Headache   Hypertension   HPI Miranda Robertson is a 34 y.o. G3P2002 at [redacted]w[redacted]d who presents with hypertension. Has had intermittent headache this week. Took BP at home & reports it's been elevated since last night. Currently denies headache. Denies visual disturbance or epigastric pain. Reports hx of ghtn in last pregnancy. Denies hypertension outside of pregnancy. +FM.   OB History     Gravida  3   Para  2   Term  2   Preterm  0   AB  0   Living  2      SAB  0   IAB  0   Ectopic  0   Multiple  0   Live Births  2           Past Medical History:  Diagnosis Date   Anemia    Anxiety    Asthma    Childhood   Blood transfusion without reported diagnosis    Depression    Gestational hypertension    HSV (herpes simplex virus) infection    Lost custody of children 04/30/2014   DSS has custody of 1st child, states I'm going to delivery in another county, they can't take this one     Mental disorder    bipolar, depression, mild menatl retardation, suicidal thoughts in 2010   Substance abuse [facility])     Past Surgical History:  Procedure Laterality Date   CESAREAN SECTION  06/09/2012   FTP   CESAREAN SECTION N/A 05/21/2014   Procedure: CESAREAN SECTION;  Surgeon: Glenys GORMAN Birk, MD;  Location: [facility];  Service: Obstetrics;  Laterality: N/A;   CHOLECYSTECTOMY     GALLBLADDER SURGERY  2014   WISDOM TOOTH EXTRACTION      Family History  Problem Relation Age of Onset   Headache Father    Alcohol abuse Father    Drug abuse Father    Headache Sister    ADD / ADHD Sister    Anxiety disorder Sister    Depression Sister    Obesity Paternal Grandmother    COPD Paternal Aunt     Social History[1]  Allergies: Allergies[2]  No medications prior to admission.     Review of Systems  All other systems reviewed and are negative.  Physical Exam   Blood pressure 133/73, pulse 71, temperature 98 F (36.7 C), temperature source Oral, resp. rate 17, height 5' 6 (1.676 m), weight 103.4 kg, last menstrual period 10/20/2023, SpO2 100%.  Physical Exam Vitals and nursing note reviewed.  Constitutional:      General: She is not in acute distress.    Appearance: She is well-developed. She is not ill-appearing.  HENT:     Head: Normocephalic and atraumatic.  Eyes:     General: No scleral icterus.       Right eye: No discharge.        Left eye: No discharge.     Conjunctiva/sclera: Conjunctivae normal.  Pulmonary:     Effort: Pulmonary effort is normal. No respiratory distress.  Musculoskeletal:     Right lower leg: 1+ Pitting Edema present.     Left lower leg: 1+ Pitting Edema present.  Neurological:     General: No focal deficit present.  Mental Status: She is alert.     Deep Tendon Reflexes:     Reflex Scores:      Patellar reflexes are 2+ on the right side and 2+ on the left side.    Comments: No clonus  Psychiatric:        Mood and Affect: Mood normal.        Behavior: Behavior normal.    NST:  Baseline: 140 bpm, Variability: Good {> 6 bpm), Accelerations: Reactive, and Decelerations: Absent   MAU Course  Procedures Results for orders placed or performed during the hospital encounter of 05/15/24 (from the past 24 hours)  Urinalysis, Routine w reflex microscopic -Urine, Clean Catch     Status: Abnormal   Collection Time: 05/15/24  3:20 PM  Result Value Ref Range   Color, Urine YELLOW YELLOW   APPearance HAZY (A) CLEAR   Specific Gravity, Urine 1.024 1.005 - 1.030   pH 5.0 5.0 - 8.0   Glucose, UA NEGATIVE NEGATIVE mg/dL   Hgb urine dipstick NEGATIVE NEGATIVE   Bilirubin Urine NEGATIVE NEGATIVE   Ketones, ur NEGATIVE NEGATIVE mg/dL   Protein, ur NEGATIVE NEGATIVE mg/dL   Nitrite NEGATIVE NEGATIVE   Leukocytes,Ua TRACE (A)  NEGATIVE   RBC / HPF 0-5 0 - 5 RBC/hpf   WBC, UA 0-5 0 - 5 WBC/hpf   Bacteria, UA RARE (A) NONE SEEN   Squamous Epithelial / HPF 6-10 0 - 5 /HPF   Mucus PRESENT    Ca Oxalate Crys, UA PRESENT   Protein / creatinine ratio, urine     Status: None   Collection Time: 05/15/24  3:20 PM  Result Value Ref Range   Creatinine, Urine 206 mg/dL   Total Protein, Urine 25 mg/dL   Protein Creatinine Ratio 0.1 <0.2 mg/mg  CBC     Status: Abnormal   Collection Time: 05/15/24  3:57 PM  Result Value Ref Range   WBC 13.7 (H) 4.0 - 10.5 K/uL   RBC 3.68 (L) 3.87 - 5.11 MIL/uL   Hemoglobin 10.7 (L) 12.0 - 15.0 g/dL   HCT 68.5 (L) 63.9 - 53.9 %   MCV 85.3 80.0 - 100.0 fL   MCH 29.1 26.0 - 34.0 pg   MCHC 34.1 30.0 - 36.0 g/dL   RDW 86.7 88.4 - 84.4 %   Platelets 300 150 - 400 K/uL   nRBC 0.0 0.0 - 0.2 %  Comprehensive metabolic panel with GFR     Status: Abnormal   Collection Time: 05/15/24  3:57 PM  Result Value Ref Range   Sodium 135 135 - 145 mmol/L   Potassium 3.6 3.5 - 5.1 mmol/L   Chloride 104 98 - 111 mmol/L   CO2 20 (L) 22 - 32 mmol/L   Glucose, Bld 78 70 - 99 mg/dL   BUN 7 6 - 20 mg/dL   Creatinine, Ser 9.57 (L) 0.44 - 1.00 mg/dL   Calcium 8.8 (L) 8.9 - 10.3 mg/dL   Total Protein 6.3 (L) 6.5 - 8.1 g/dL   Albumin 3.3 (L) 3.5 - 5.0 g/dL   AST 13 (L) 15 - 41 U/L   ALT 5 0 - 44 U/L   Alkaline Phosphatase 87 38 - 126 U/L   Total Bilirubin 0.2 0.0 - 1.2 mg/dL   GFR, Estimated >39 >39 mL/min   Anion gap 11 5 - 15   No results found.  MDM   Assessment and Plan   1. Gestational hypertension, third trimester   2. [redacted] weeks gestation  of pregnancy    -Elevated BP in office last week & persistently elevated in MAU today. Meets criteria for GHTN.  -No SRBP & patient currently asymptomatic.  -Preeclampsia labs normal.  -Has appt with OB next week. Discussed diagnosis & delivery timing with patient. Reviewed preeclampsia precautions & reasons to return to MAU  Rocky Satterfield 05/15/2024,  7:52 PM      [1]  Social History Tobacco Use   Smoking status: Never   Smokeless tobacco: Never  Vaping Use   Vaping status: Never Used  Substance Use Topics   Alcohol use: Not Currently   Drug use: Not Currently    Types: Marijuana    Comment: last use  Mar 03 2024  [2] No Known Allergies  "

## 2024-05-15 NOTE — MAU Note (Addendum)
 MAU Triage Note  Miranda Robertson is a 34 y.o. at [redacted]w[redacted]d here in MAU reporting: elevated BP, this morning she had readings of 140s/90s and 130s/90s, has Hx of PIH. Reports she has a HA currently and reports intermittent blurry vision/dizziness for the past few days. Denies RUQ pain, swelling, VB and LOF. Endorses +FM   Pain score: HA 7/10 Vitals:   05/15/24 1505  BP: (!) 146/93  Pulse: 95  Resp: 17  Temp: 98 F (36.7 C)  SpO2: 100%     FHT: 147  Lab orders placed from triage: UA

## 2024-05-16 ENCOUNTER — Encounter: Payer: Self-pay | Admitting: Obstetrics & Gynecology

## 2024-05-21 ENCOUNTER — Encounter: Payer: Self-pay | Admitting: *Deleted

## 2024-05-22 ENCOUNTER — Ambulatory Visit: Payer: MEDICAID

## 2024-05-22 ENCOUNTER — Ambulatory Visit: Payer: MEDICAID | Admitting: Cardiology

## 2024-05-22 ENCOUNTER — Encounter: Payer: Self-pay | Admitting: Cardiology

## 2024-05-22 ENCOUNTER — Encounter (HOSPITAL_COMMUNITY): Payer: Self-pay

## 2024-05-22 VITALS — BP 110/70 | HR 102 | Ht 66.0 in | Wt 231.0 lb

## 2024-05-22 DIAGNOSIS — R42 Dizziness and giddiness: Secondary | ICD-10-CM

## 2024-05-22 DIAGNOSIS — R0789 Other chest pain: Secondary | ICD-10-CM

## 2024-05-22 DIAGNOSIS — Z7689 Persons encountering health services in other specified circumstances: Secondary | ICD-10-CM

## 2024-05-22 DIAGNOSIS — R55 Syncope and collapse: Secondary | ICD-10-CM | POA: Diagnosis not present

## 2024-05-22 DIAGNOSIS — R079 Chest pain, unspecified: Secondary | ICD-10-CM

## 2024-05-22 DIAGNOSIS — Z3A30 30 weeks gestation of pregnancy: Secondary | ICD-10-CM | POA: Diagnosis not present

## 2024-05-22 NOTE — Progress Notes (Unsigned)
 Applied a 7 day Zio XT monitor to patient in the office

## 2024-05-22 NOTE — Patient Instructions (Addendum)
 Medication Instructions:  Your physician recommends that you continue on your current medications as directed. Please refer to the Current Medication list given to you today.  *If you need a refill on your cardiac medications before your next appointment, please call your pharmacy*  Testing/Procedures: Your physician has requested that you have an echocardiogram - as soon as possible. Echocardiography is a painless test that uses sound waves to create images of your heart. It provides your doctor with information about the size and shape of your heart and how well your hearts chambers and valves are working. This procedure takes approximately one hour. There are no restrictions for this procedure. Please do NOT wear cologne, perfume, aftershave, or lotions (deodorant is allowed). Please arrive 15 minutes prior to your appointment time.  Please note: We ask at that you not bring children with you during ultrasound (echo/ vascular) testing. Due to room size and safety concerns, children are not allowed in the ultrasound rooms during exams. Our front office staff cannot provide observation of children in our lobby area while testing is being conducted. An adult accompanying a patient to their appointment will only be allowed in the ultrasound room at the discretion of the ultrasound technician under special circumstances. We apologize for any inconvenience.  ZIO XT- Long Term Monitor Instructions  Your physician has requested you wear a ZIO patch monitor for 7 days.  This is a single patch monitor. [employer] supplies one patch monitor per enrollment. Additional stickers are not available. Please do not apply patch if you will be having a Nuclear Stress Test,  Echocardiogram, Cardiac CT, MRI, or Chest Xray during the period you would be wearing the  monitor. The patch cannot be worn during these tests. You cannot remove and re-apply the  ZIO XT patch monitor.  Your ZIO patch monitor will be mailed 3  day USPS to your address on file. It may take 3-5 days  to receive your monitor after you have been enrolled.  Once you have received your monitor, please review the enclosed instructions. Your monitor  has already been registered assigning a specific monitor serial # to you.  Billing and Patient Assistance Program Information  We have supplied [employer] with any of your insurance information on file for billing purposes. [employer] offers a sliding scale Patient Assistance Program for patients that do not have  insurance, or whose insurance does not completely cover the cost of the ZIO monitor.  You must apply for the Patient Assistance Program to qualify for this discounted rate.  To apply, please call [employer] at [phone], select option 4, select option 2, ask to apply for  Patient Assistance Program. Meredeth will ask your household income, and how many people  are in your household. They will quote your out-of-pocket cost based on that information.  [employer] will also be able to set up a 34-month, interest-free payment plan if needed.  Applying the monitor   Shave hair from upper left chest.  Hold abrader disc by orange tab. Rub abrader in 40 strokes over the upper left chest as  indicated in your monitor instructions.  Clean area with 4 enclosed alcohol pads. Let dry.  Apply patch as indicated in monitor instructions. Patch will be placed under collarbone on left  side of chest with arrow pointing upward.  Rub patch adhesive wings for 2 minutes. Remove white label marked 1. Remove the white  label marked 2. Rub patch adhesive wings for 2 additional minutes.  While looking in a  mirror, press and release button in center of patch. A small green light will  flash 3-4 times. This will be your only indicator that the monitor has been turned on.  Do not shower for the first 24 hours. You may shower after the first 24 hours.  Press the button if you feel a symptom. You will hear a small  click. Record Date, Time and  Symptom in the Patient Logbook.  When you are ready to remove the patch, follow instructions on the last 2 pages of Patient  Logbook. Stick patch monitor onto the last page of Patient Logbook.  Place Patient Logbook in the blue and white box. Use locking tab on box and tape box closed  securely. The blue and white box has prepaid postage on it. Please place it in the mailbox as  soon as possible. Your physician should have your test results approximately 7 days after the  monitor has been mailed back to [facility].  Call [facility] Customer Care at [phone] if you have questions regarding  your ZIO XT patch monitor. Call them immediately if you see an orange light blinking on your  monitor.  If your monitor falls off in less than 4 days, contact our Monitor department at [phone].  If your monitor becomes loose or falls off after 4 days call [employer] at [phone] for  suggestions on securing your monitor   Follow-Up: At [facility], you and your health needs are our priority.  As part of our continuing mission to provide you with exceptional heart care, our providers are all part of one team.  This team includes your primary Cardiologist (physician) and Advanced Practice Providers or APPs (Physician Assistants and Nurse Practitioners) who all work together to provide you with the care you need, when you need it.  Your next appointment:   6 week(s)  Provider:   Kardie Tobb, DO  or Lum Louis, NP  Other Instructions:

## 2024-05-23 ENCOUNTER — Ambulatory Visit (HOSPITAL_COMMUNITY): Payer: MEDICAID

## 2024-05-24 ENCOUNTER — Other Ambulatory Visit: Payer: Self-pay

## 2024-05-24 ENCOUNTER — Ambulatory Visit: Payer: Self-pay | Admitting: Obstetrics & Gynecology

## 2024-05-24 VITALS — BP 125/84 | HR 90 | Wt 229.0 lb

## 2024-05-24 DIAGNOSIS — O133 Gestational [pregnancy-induced] hypertension without significant proteinuria, third trimester: Secondary | ICD-10-CM

## 2024-05-24 DIAGNOSIS — Z3A31 31 weeks gestation of pregnancy: Secondary | ICD-10-CM

## 2024-05-24 DIAGNOSIS — Z98891 History of uterine scar from previous surgery: Secondary | ICD-10-CM

## 2024-05-24 DIAGNOSIS — O0990 Supervision of high risk pregnancy, unspecified, unspecified trimester: Secondary | ICD-10-CM

## 2024-05-24 NOTE — Progress Notes (Signed)
 "  PRENATAL VISIT NOTE  Subjective:  Miranda Robertson is a 34 y.o. G3P2002 at [redacted]w[redacted]d being seen today for ongoing prenatal care.  She is currently monitored for the following issues for this high-risk pregnancy and has HSV-2 (herpes simplex virus 2) infection; History of cesarean section x 2; Gestational hypertension; Attention deficit hyperactivity disorder (ADHD), predominantly inattentive type; Bipolar 1 disorder, mixed, partial remission (HCC); Depressive disorder, atypical; PTSD (post-traumatic stress disorder); History of syphilis; Supervision of high risk pregnancy, antepartum; GBS bacteriuria; Subchorionic hemorrhage in second trimester; and Marijuana abuse, continuous on their problem list.  Patient reports no complaints.  Contractions: Not present. Vag. Bleeding: None.  Movement: Present. Denies leaking of fluid.   The following portions of the patient's history were reviewed and updated as appropriate: allergies, current medications, past family history, past medical history, past social history, past surgical history and problem list.   Objective:   Vitals:   05/24/24 1116  BP: 125/84  Pulse: 90  Weight: 229 lb (103.9 kg)    Fetal Status:  Fetal Heart Rate (bpm): 143   Movement: Present    General: Alert, oriented and cooperative. Patient is in no acute distress.  Skin: Skin is warm and dry. No rash noted.   Cardiovascular: Normal heart rate noted  Respiratory: Normal respiratory effort, no problems with respiration noted  Abdomen: Soft, gravid, appropriate for gestational age.  Pain/Pressure: Present (pelvic pressure)     Pelvic: Cervical exam deferred        Extremities: Normal range of motion.  Edema: None  Mental Status: Normal mood and affect. Normal behavior. Normal judgment and thought content.      05/11/2024   10:06 AM 04/10/2024    9:01 AM 02/27/2024    3:46 PM  Depression screen PHQ 2/9  Decreased Interest   2  Down, Depressed, Hopeless   2  PHQ - 2 Score   4   Altered sleeping   1  Tired, decreased energy   2  Change in appetite   1  Feeling bad or failure about yourself    2  Trouble concentrating   2  Moving slowly or fidgety/restless   2  Suicidal thoughts   0  PHQ-9 Score   14  Difficult doing work/chores        Information is confidential and restricted. Go to Review Flowsheets to unlock data.        05/11/2024   10:07 AM 04/10/2024    9:01 AM 02/22/2024    3:10 PM 12/12/2023    8:47 AM  GAD 7 : Generalized Anxiety Score  Nervous, Anxious, on Edge    1   Control/stop worrying    1   Worry too much - different things    1   Trouble relaxing    1   Restless    1   Easily annoyed or irritable    1   Afraid - awful might happen    0   Total GAD 7 Score    6  Anxiety Difficulty         Information is confidential and restricted. Go to Review Flowsheets to unlock data.   Data saved with a previous flowsheet row definition    Assessment and Plan:  Pregnancy: G3P2002 at [redacted]w[redacted]d 1. Gestational hypertension, third trimester (Primary) 2. History of cesarean section x 2 Stable BP today. Will be started on weekly antenatal testing until delivery. Sent order to Surgery Scheduler to schedule RCS at 37 weeks,  she declines any postpartum  BCM. Patient wants female providers only, she was told this cannot be guaranteed. - [place]  MFM FETAL BPP WO NON STRESS; Future - Ambulatory Referral For Surgery Scheduling  3. [redacted] weeks gestation of pregnancy 4. Supervision of high risk pregnancy, antepartum No other concerns.  RSV information given to her. Preterm labor symptoms and general obstetric precautions including but not limited to vaginal bleeding, contractions, leaking of fluid and fetal movement were reviewed in detail with the patient. Please refer to After Visit Summary for other counseling recommendations.   Return in about 1 week (around 05/31/2024) for BPP only   2 weeks: MD visit  3 weeks: OB visit and BPP.  Future Appointments   Date Time Provider Department Center  05/30/2024  8:15 AM [facility] US1 [facility]  06/05/2024  8:15 AM [facility] PROVIDER 1 [facility]  06/05/2024  8:30 AM [facility] US2 [facility]  06/05/2024  9:35 AM Regino, Camie LABOR, CNM [facility]  07/03/2024  8:00 AM Tobb, Kardie, DO [facility]  08/01/2024 10:00 AM Harl Zane BRAVO, NP [facility] None    Gloris Hugger, MD  "

## 2024-05-24 NOTE — Patient Instructions (Addendum)
 Safe Medications in Pregnancy   Acne:  Benzoyl Peroxide  Salicylic Acid   Backache/Headache:  Tylenol : 2 regular strength every 4 hours OR               2 Extra strength every 6 hours   Colds/Coughs/Allergies:  Benadryl  (alcohol free) 25 mg every 6 hours as needed  Breath right strips  Claritin  Cepacol throat lozenges  Chloraseptic throat spray  Cold-Eeze- up to three times per day  Cough drops, alcohol free  Flonase (by prescription only)  Guaifenesin   Mucinex   Robitussin DM (plain only, alcohol free)  Saline nasal spray/drops  Sudafed (pseudoephedrine ) & Actifed * use only after [redacted] weeks gestation and if you do not have high blood pressure  Tylenol   Vicks Vaporub  Zinc lozenges  Zyrtec   Constipation:  Colace  Ducolax suppositories  Fleet enema  Glycerin  suppositories  Metamucil  Milk of magnesia  Miralax   Senokot  Smooth move tea   Diarrhea:  Kaopectate  Imodium A-D   *NO pepto Bismol   Hemorrhoids:  Anusol  Anusol HC  Preparation H  Tucks   Indigestion:  Tums  Maalox  Mylanta  Zantac  Pepcid    Insomnia:  Benadryl  (alcohol free) 25mg  every 6 hours as needed  Tylenol  PM  Unisom , no Gelcaps   Leg Cramps:  Tums  MagGel   Nausea/Vomiting:  Bonine  Dramamine  Emetrol  Ginger extract  Sea bands  Meclizine  Nausea medication to take during pregnancy:  Unisom  (doxylamine  succinate 25 mg tablets) Take one tablet daily at bedtime. If symptoms are not adequately controlled, the dose can be increased to a maximum recommended dose of two tablets daily (1/2 tablet in the morning, 1/2 tablet mid-afternoon and one at bedtime).  Vitamin B6 100mg  tablets. Take one tablet twice a day (up to 200 mg per day).   Skin Rashes:  Aveeno products  Benadryl  cream or 25mg  every 6 hours as needed  Calamine Lotion  1% cortisone cream   Yeast infection:  Gyne-lotrimin 7  Monistat 7    **If taking multiple medications, please check labels to avoid  duplicating the same active ingredients  **take medication as directed on the label  ** Do not exceed 4000 mg of tylenol  in 24 hours  **Do not take medications that contain aspirin  or ibuprofen            RSV Vaccination for Pregnant People  CDC recommends two ways to protect babies from getting very sick with Respiratory Syncytial Virus (RSV):  An RSV vaccination given during pregnancy  Pfizers vaccine (Abrysvo) is recommended for use during pregnancy. It is given during RSV season to people who are 32 through [redacted] weeks pregnant.  Or, An RSV immunization given directly to infants and some older babies  Babies born to mothers who get RSV vaccine at least 2 weeks before delivery will have protection and, in most cases, should not need an RSV immunization later.    When is RSV season?  In most regions of the [place]  RSV season starts in the fall and peaks in the winter, but the timing and severity of RSV season can vary from place to place and year to year.   The goal of maternal RSV vaccination is to protect babies from getting very sick with RSV during their first RSV season.  In most of the continental [place] , this means maternal RSV vaccine will be given in September through January.  Who should get the maternal RSV  vaccine?  People who are 73 through [redacted] weeks pregnant during September through January should get one dose of maternal RSV vaccine to protect their babies. RSV season can vary around the country.   How is the maternal RSV vaccine administered?  Maternal RSV vaccine is given as a shot into the mothers upper arm. Only a single dose (one shot) of maternal RSV vaccine is recommended.   It is not yet known whether another dose might be needed in later pregnancies.  How well does the maternal RSV vaccine work?  When someone gets RSV vaccine, their body responds by making a protein that protects against the virus that causes RSV. The process takes about 2  weeks. When a pregnant person gets RSV vaccine, their protective proteins (called antibodies) also pass to their baby. So, babies who are born at least 2 weeks after their mother gets RSV vaccine are protected at birth, when infants are at the highest risk of severe RSV disease.   The vaccine can reduce a babys risk of being hospitalized from RSV by 57% in the first six months after birth.  What are the possible side effects of the maternal RSV vaccine?  In the clinical trials, the side effects most often reported by pregnant people who received the maternal RSV vaccine were pain at the injection site, headache, muscle pain, and nausea.  Although not common, a dangerous high blood pressure condition called pre-eclampsia occurred in 1.8% of pregnant people who received the maternal RSV vaccine compared to 1.4% of pregnant people who received a placebo.  The clinical trials identified a small increase in the number of preterm births in vaccinated pregnant people. It is not clear if this is a true safety problem related to RSV vaccine or if this occurred for reasons unrelated to vaccination.  To reduce the potential risk of preterm birth and complications from RSV disease, FDA approved the maternal RSV vaccine for use during weeks 32 through 49 of pregnancy while additional studies are conducted.  FDA is requiring the manufacturer to do additional studies that will look more closely at the potential risk of preterm births and pregnancy-related high blood pressure issues in mothers, including pre-eclampsia.  Severe allergic reactions to vaccines are rare but can happen after any vaccine and can be life-threatening. If you see signs of a severe allergic reaction after vaccination (hives, swelling of the face and throat, difficulty breathing, a fast heartbeat, dizziness, or weakness), seek immediate medical care by calling 911.  As with any medicine or vaccine there is a very remote chance of the vaccine  causing other serious injury or death after vaccination.  Adverse events following vaccination should be reported to the Vaccine Adverse Event Reporting System (VAERS), even if its not clear that the vaccine caused the adverse event. You or your doctor can report an adverse event to [facility] and FDA through VAERS. If you need further assistance reporting to VAERS, please email [email or web address] or call [phone].  If you have any questions about side effects from the maternal RSV vaccine, talk with your healthcare provider.  Do I need a prescription for a maternal RSV vaccine?  Until the vaccine available in the office, you will need a prescription to take to a local pharmacy that is providing the vaccine.   How do I pay for the maternal RSV vaccine?  Most private health insurance plans cover the maternal RSV vaccine, but there may be a cost to you depending on your plan.  Contact your  insurer to find out.  Medicaid Beginning January 17, 2022, most people with coverage from [facility] and [employer] [facility] will be guaranteed coverage of all vaccines recommended by the Advisory Committee on Immunization Practice at no cost to them.   Source: [facility] for Immunization and Respiratory Diseases

## 2024-05-30 ENCOUNTER — Other Ambulatory Visit: Payer: MEDICAID

## 2024-06-05 ENCOUNTER — Encounter: Payer: Self-pay | Admitting: Certified Nurse Midwife

## 2024-06-05 ENCOUNTER — Ambulatory Visit: Payer: MEDICAID

## 2024-06-05 DIAGNOSIS — R8271 Bacteriuria: Secondary | ICD-10-CM

## 2024-06-05 DIAGNOSIS — O0990 Supervision of high risk pregnancy, unspecified, unspecified trimester: Secondary | ICD-10-CM

## 2024-06-07 ENCOUNTER — Encounter: Payer: MEDICAID | Admitting: Obstetrics and Gynecology

## 2024-06-11 ENCOUNTER — Other Ambulatory Visit: Payer: MEDICAID

## 2024-06-11 ENCOUNTER — Encounter: Payer: Self-pay | Admitting: Obstetrics and Gynecology

## 2024-07-02 ENCOUNTER — Encounter (HOSPITAL_COMMUNITY): Payer: Self-pay

## 2024-07-02 ENCOUNTER — Inpatient Hospital Stay (HOSPITAL_COMMUNITY): Admit: 2024-07-02 | Payer: Self-pay | Admitting: Obstetrics and Gynecology

## 2024-07-03 ENCOUNTER — Ambulatory Visit: Payer: MEDICAID | Admitting: Cardiology

## 2024-08-01 ENCOUNTER — Telehealth (HOSPITAL_COMMUNITY): Payer: MEDICAID | Admitting: Psychiatry
